# Patient Record
Sex: Male | Born: 1955 | Race: White | Hispanic: No | Marital: Married | Smoking: Never smoker
Health system: Southern US, Community
[De-identification: ages and names within clinical notes are randomized; demographics above are authoritative.]

## PROBLEM LIST (undated history)

## (undated) DIAGNOSIS — E785 Hyperlipidemia, unspecified: Secondary | ICD-10-CM

## (undated) DIAGNOSIS — R030 Elevated blood-pressure reading, without diagnosis of hypertension: Secondary | ICD-10-CM

## (undated) DIAGNOSIS — I2510 Atherosclerotic heart disease of native coronary artery without angina pectoris: Secondary | ICD-10-CM

## (undated) HISTORY — PX: OTHER SURGICAL HISTORY: SHX169

---

## 2008-07-20 ENCOUNTER — Encounter (INDEPENDENT_AMBULATORY_CARE_PROVIDER_SITE_OTHER): Payer: Self-pay | Admitting: *Deleted

## 2009-03-04 ENCOUNTER — Telehealth: Payer: Self-pay | Admitting: Internal Medicine

## 2009-11-01 ENCOUNTER — Encounter (INDEPENDENT_AMBULATORY_CARE_PROVIDER_SITE_OTHER): Payer: Self-pay | Admitting: *Deleted

## 2009-11-28 ENCOUNTER — Encounter (INDEPENDENT_AMBULATORY_CARE_PROVIDER_SITE_OTHER): Payer: Self-pay | Admitting: *Deleted

## 2009-11-30 ENCOUNTER — Ambulatory Visit: Payer: Self-pay | Admitting: Internal Medicine

## 2009-12-12 ENCOUNTER — Ambulatory Visit: Payer: Self-pay | Admitting: Internal Medicine

## 2010-03-02 NOTE — Letter (Signed)
Summary: Pre Visit Letter Revised  [facility]  [address] [place], [place] [address]   Phone: [phone]  Fax: [phone]        11/01/2009 MRN: [id number] Tyler Fletcher [address] [place], [place]  [address]  [place]             Procedure Date:  12/12/2009   Welcome to the Gastroenterology Division at [facility].    You are scheduled to see a nurse for your pre-procedure visit on 11/30/2009 at 10:00AM on the 3rd floor at [facility], [address]  We ask that you try to arrive at our office 15 minutes prior to your appointment time to allow for check-in.  Please take a minute to review the attached form.  If you answer "Yes" to one or more of the questions on the first page, we ask that you call the person listed at your earliest opportunity.  If you answer "No" to all of the questions, please complete the rest of the form and bring it to your appointment.    Your nurse visit will consist of discussing your medical and surgical history, your immediate family medical history, and your medications.   If you are unable to list all of your medications on the form, please bring the medication bottles to your appointment and we will list them.  We will need to be aware of both prescribed and over the counter drugs.  We will need to know exact dosage information as well.    Please be prepared to read and sign documents such as consent forms, a financial agreement, and acknowledgement forms.  If necessary, and with your consent, a friend or relative is welcome to sit-in on the nurse visit with you.  Please bring your insurance card so that we may make a copy of it.  If your insurance requires a referral to see a specialist, please bring your referral form from your primary care physician.  No co-pay is required for this nurse visit.     If you cannot keep your appointment, please call [phone] to cancel or reschedule prior to your appointment date.   This allows [place] the opportunity to schedule an appointment for another patient in need of care.    Thank you for choosing [facility] for your medical needs.  We appreciate the opportunity to care for you.  Please visit [place] at our website  to learn more about our practice.  Sincerely, The Gastroenterology Division

## 2010-03-02 NOTE — Progress Notes (Signed)
Summary: Schedule Colonoscopy  Phone Note Outgoing Call   Call placed by: Hortense Ramal CMA Duncan Dull),  March 04, 2009 4:50 PM Call placed to: Patient Summary of Call: Patient is due for a recall colonoscopy due to her history of diverticulosis and hyperplastic colonic polyps. I have left a message for patient to call back.  Initial call taken by: Hortense Ramal CMA Duncan Dull),  March 04, 2009 4:51 PM  Follow-up for Phone Call        Left message for patient to call back. Hortense Ramal CMA Duncan Dull)  March 10, 2009   We have not recieved a call back from patient. We will send letter. Hortense Ramal CMA Duncan Dull)  March 15, 2009 9:31 AM

## 2010-03-02 NOTE — Letter (Signed)
Summary: [facility] Instructions  [facility]  [address] [place], [place] [address]   Phone: [phone]  Fax: [phone]       Tyler Fletcher    03-22-1955    MRN: [id number]        Procedure Day /Date:  Monday 12/12/2009     Arrival Time: 1:00 pm      Procedure Time: 2:00 pm     Location of Procedure:                    _x _  [facility] (4th Floor)                        PREPARATION FOR COLONOSCOPY WITH MOVIPREP   Starting 5 days prior to your procedure Wednesday 11/9 do not eat nuts, seeds, popcorn, corn, beans, peas,  salads, or any raw vegetables.  Do not take any fiber supplements (e.g. Metamucil, Citrucel, and Benefiber).  THE DAY BEFORE YOUR PROCEDURE         DATE: Sunday 11/13  1.  Drink clear liquids the entire day-NO SOLID FOOD  2.  Do not drink anything colored red or purple.  Avoid juices with pulp.  No orange juice.  3.  Drink at least 64 oz. (8 glasses) of fluid/clear liquids during the day to prevent dehydration and help the prep work efficiently.  CLEAR LIQUIDS INCLUDE: Water Jello Ice Popsicles Tea (sugar ok, no milk/cream) Powdered fruit flavored drinks Coffee (sugar ok, no milk/cream) Gatorade Juice: apple, white grape, white cranberry  Lemonade Clear bullion, consomm, broth Carbonated beverages (any kind) Strained chicken noodle soup Hard Candy                             4.  In the morning, mix first dose of MoviPrep solution:    Empty 1 Pouch A and 1 Pouch B into the disposable container    Add lukewarm drinking water to the top line of the container. Mix to dissolve    Refrigerate (mixed solution should be used within 24 hrs)  5.  Begin drinking the prep at 5:00 p.m. The MoviPrep container is divided by 4 marks.   Every 15 minutes drink the solution down to the next mark (approximately 8 oz) until the full liter is complete.   6.  Follow completed prep with 16 oz of clear liquid of your choice (Nothing  red or purple).  Continue to drink clear liquids until bedtime.  7.  Before going to bed, mix second dose of MoviPrep solution:    Empty 1 Pouch A and 1 Pouch B into the disposable container    Add lukewarm drinking water to the top line of the container. Mix to dissolve    Refrigerate  THE DAY OF YOUR PROCEDURE      DATE: Monday 11/14  Beginning at 9:00 a.m. (5 hours before procedure):         1. Every 15 minutes, drink the solution down to the next mark (approx 8 oz) until the full liter is complete.  2. Follow completed prep with 16 oz. of clear liquid of your choice.    3. You may drink clear liquids until 12:00 pm (2 HOURS BEFORE PROCEDURE).   MEDICATION INSTRUCTIONS  Unless otherwise instructed, you should take regular prescription medications with a small sip of water   as early as possible the morning of  your procedure.           OTHER INSTRUCTIONS  You will need a responsible adult at least 56 years of age to accompany you and drive you home.   This person must remain in the waiting room during your procedure.  Wear loose fitting clothing that is easily removed.  Leave jewelry and other valuables at home.  However, you may wish to bring a book to read or  an iPod/MP3 player to listen to music as you wait for your procedure to start.  Remove all body piercing jewelry and leave at home.  Total time from sign-in until discharge is approximately 2-3 hours.  You should go home directly after your procedure and rest.  You can resume normal activities the  day after your procedure.  The day of your procedure you should not:   Drive   Make legal decisions   Operate machinery   Drink alcohol   Return to work  You will receive specific instructions about eating, activities and medications before you leave.    The above instructions have been reviewed and explained to me by   Ezra Sites RN  November 30, 2009 10:14 AM     I fully understand and can  verbalize these instructions _____________________________ Date _________

## 2010-03-02 NOTE — Miscellaneous (Signed)
Summary: [facility]  Clinical Lists Changes  Medications: Added new medication of MOVIPREP 100 GM  SOLR (PEG-KCL-NACL-NASULF-NA ASC-C) As per prep instructions. - Signed Rx of MOVIPREP 100 GM  SOLR (PEG-KCL-NACL-NASULF-NA ASC-C) As per prep instructions.;  #1 x 0;  Signed;  Entered by: Ezra Sites RN;  Authorized by: Hilarie Fredrickson MD;  Method used: Electronically to [facility] [id number]*, [address], [place], [place], [place]  [address], Ph: [phone], Fax: [phone] Observations: Added new observation of NKA: T (11/30/2009 9:53)    Prescriptions: MOVIPREP 100 GM  SOLR (PEG-KCL-NACL-NASULF-NA ASC-C) As per prep instructions.  #1 x 0   Entered by:   Ezra Sites RN   Authorized by:   Hilarie Fredrickson MD   Signed by:   Ezra Sites RN on 11/30/2009   Method used:   Electronically to        [facility] [phone]* (retail)       [address]       [place], [place]  [address]       Ph: [phone]       Fax: [phone]   RxID:   [id number]

## 2010-03-02 NOTE — Procedures (Signed)
Summary: Colonoscopy  Patient: Tyler Fletcher Note: All result statuses are Final unless otherwise noted.  Tests: (1) Colonoscopy (COL)   COL Colonoscopy           DONE     [facility]     [address]     [place], [place]  [address]           COLONOSCOPY PROCEDURE REPORT           PATIENT:  Tyler Fletcher, Tyler Fletcher  MR#:  [id number]     BIRTHDATE:  1956-01-15, 54 yrs. old  GENDER:  male     ENDOSCOPIST:  Wilhemina Bonito. Eda Keys, MD     REF. BY:  Surveillance Program Recall,     PROCEDURE DATE:  12/12/2009     PROCEDURE:  Surveillance Colonoscopy     ASA CLASS:  Class I     INDICATIONS:  history of polyps, surveillance and high-risk     screening ; index exam 07-2003 w/ HPP x 2     MEDICATIONS:   Fentanyl 100 mcg IV, Versed 10 mg IV           DESCRIPTION OF PROCEDURE:   After the risks benefits and     alternatives of the procedure were thoroughly explained, informed     consent was obtained.  Digital rectal exam was performed and     revealed no abnormalities.   The LB 180AL [id number] endoscope was     introduced through the anus and advanced to the cecum, which was     identified by both the appendix and ileocecal valve, without     limitations.Time to cecum = 1:14 min.  The quality of the prep was     excellent, using MoviPrep.  The instrument was then slowly     withdrawn (time = 10:04 min) as the colon was fully examined.     <<PROCEDUREIMAGES>>           FINDINGS:  Moderate diverticulosis was found in the left colon.     This was otherwise a normal examination of the colon.  No polyps or     cancers were seen.   Retroflexed views in the rectum revealed     internal hemorrhoids.    The scope was then withdrawn from the     patient and the procedure completed.           COMPLICATIONS:  None           ENDOSCOPIC IMPRESSION:     1) Moderate diverticulosis in the left colon     2) Otherwise normal examination     3) No polyps or cancers     4) Internal hemorrhoids        RECOMMENDATIONS:     1) Continue current colorectal screening recommendations for     "routine risk" patients with a repeat colonoscopy in 10 years.     ______________________________     Wilhemina Bonito. Eda Keys, MD           CC:  Lupe Carney, MD; The Patient           n.     eSIGNED:   Wilhemina Bonito. Eda Keys at 12/12/2009 02:39 PM           Lanette Hampshire, [id number]  Note: An exclamation mark (!) indicates a result that was not dispersed into the flowsheet. Document Creation Date: 12/12/2009 2:40 PM _______________________________________________________________________  (1) Order result status: Final Collection or observation  date-time: 12/12/2009 14:31 Requested date-time:  Receipt date-time:  Reported date-time:  Referring Physician:   Ordering Physician: Fransico Setters [id number]) Specimen Source:  Source: Launa Grill Order Number: [id number] Lab site:   Appended Document: Colonoscopy    Clinical Lists Changes  Observations: Added new observation of COLONNXTDUE: 11/2019 (12/12/2009 16:18)

## 2014-11-29 ENCOUNTER — Ambulatory Visit: Payer: Self-pay | Admitting: Surgery

## 2014-11-29 NOTE — H&P (Signed)
History of Present Illness Tyler Fletcher(Jaquelyne Firkus K. Ayodeji Keimig MD; 11/29/2014 10:20 AM) Patient words: cyst and hernia.  The patient is a 59 year old male who presents with an umbilical hernia. Referred by Dr. Lupe Carneyean Mitchell for recurrent umbilical hernia and mass on posterior neck  This is a 59 year old male who is status post right inguinal hernia repair with mesh by myself on 06/05/10. The patient is doing well. Several years ago he began noticing a small lump on the back of his neck. This has become fairly large and uncomfortable. Occasionally does cause headaches. Currently measures about 4 cm across and has begun to protrude. He would like to have this removed to see if that helps with his headaches.  In 1992, the patient underwent laparoscopic cholecystectomy as well as primary repair of a small umbilical hernia. Over the last couple of years he has noticed an enlarging mass just to the right of his incision. He was examined by Dr. Clovis RileyMitchell who felt that this likely represented an umbilical hernia. The patient denies any obstructive symptoms. He presents now to discuss surgical treatment of both of these problems.   Other Problems (Ammie Eversole, LPN; 47/42/595610/31/2016 9:06 AM) Arthritis Back Pain Enlarged Prostate Gastroesophageal Reflux Disease  Past Surgical History (Ammie Eversole, LPN; 38/75/643310/31/2016 9:06 AM) Gallbladder Surgery - Laparoscopic Knee Surgery Left. Laparoscopic Inguinal Hernia Surgery Left. Vasectomy Ventral / Umbilical Hernia Surgery Left.  Diagnostic Studies History (Ammie Eversole, LPN; 29/51/884110/31/2016 9:05 AM) Colonoscopy 1-5 years ago  Allergies (Ammie Eversole, LPN; 66/06/301610/31/2016 9:11 AM) No Known Drug Allergies 11/29/2014  Medication History (Ammie Eversole, LPN; 01/09/323510/31/2016 9:12 AM) Etodolac (400MG  Tablet, Oral) Active. Hydrocodone-Acetaminophen (5-325MG  Tablet, Oral) Active. Multiple Vitamin (Oral) Active. Ranitidine Acid Reducer (75MG  Tablet, Oral)  Active. Claritin (10MG  Capsule, Oral) Active. Medications Reconciled  Social History (Ammie Eversole, LPN; 57/32/202510/31/2016 9:06 AM) Alcohol use Occasional alcohol use. Tobacco use Current some day smoker.  Family History (Deon Pillingmmie Eversole, LPN; 42/70/623710/31/2016 9:06 AM) Arthritis Father, Mother. Hypertension Father.     Review of Systems (Ammie Eversole LPN; 62/83/151710/31/2016 9:06 AM) General Not Present- Appetite Loss, Chills, Fatigue, Fever, Night Sweats, Weight Gain and Weight Loss. Skin Not Present- Change in Wart/Mole, Dryness, Hives, Jaundice, New Lesions, Non-Healing Wounds, Rash and Ulcer. HEENT Present- Seasonal Allergies. Not Present- Earache, Hearing Loss, Hoarseness, Nose Bleed, Oral Ulcers, Ringing in the Ears, Sinus Pain, Sore Throat, Visual Disturbances, Wears glasses/contact lenses and Yellow Eyes. Respiratory Not Present- Bloody sputum, Chronic Cough, Difficulty Breathing, Snoring and Wheezing. Breast Not Present- Breast Mass, Breast Pain, Nipple Discharge and Skin Changes. Cardiovascular Not Present- Chest Pain, Difficulty Breathing Lying Down, Leg Cramps, Palpitations, Rapid Heart Rate, Shortness of Breath and Swelling of Extremities. Gastrointestinal Not Present- Abdominal Pain, Bloating, Bloody Stool, Change in Bowel Habits, Chronic diarrhea, Constipation, Difficulty Swallowing, Excessive gas, Gets full quickly at meals, Hemorrhoids, Indigestion, Nausea, Rectal Pain and Vomiting. Male Genitourinary Present- Frequency, Nocturia and Painful Urination. Not Present- Blood in Urine, Change in Urinary Stream, Impotence, Urgency and Urine Leakage.  Vitals (Ammie Eversole LPN; 61/60/737110/31/2016 9:11 AM) 11/29/2014 9:11 AM Weight: 224.4 lb Height: 75in Body Surface Area: 2.3 m Body Mass Index: 28.05 kg/m  Temp.: 98.23F(Oral)  Pulse: 88 (Regular)  BP: 172/88 (Sitting, Left Arm, Standard)      Physical Exam Molli Hazard(Khaleel Beckom K. Leiam Hopwood MD; 11/29/2014 10:21 AM)  The physical exam  findings are as follows: Note:WDWN in NAD HEENT: EOMI, sclera anicteric Neck: posterior neck - 4 cm protruding subcutaneous mass - no sign of infection or drainage, no thyromegaly Lungs:  CTA bilaterally; normal respiratory effort CV: Regular rate and rhythm; no murmurs Abd: +bowel sounds, soft, non-tender, 3 cm mass to the right of the umbilicus - partially reducible when supine and relaxed Ext: Well-perfused; no edema Skin: Warm, dry; no sign of jaundice    Assessment & Plan Tyler Fletcher. Kemesha Mosey MD; 11/29/2014 9:29 AM)  RECURRENT UMBILICAL HERNIA (K42.9)  Current Plans Schedule for Surgery - Umbilical hernia repair with mesh; excision of subcutaneous lipoma - posterior neck. The surgical procedure has been discussed with the patient. Potential risks, benefits, alternative treatments, and expected outcomes have been explained. All of the patient's questions at this time have been answered. The likelihood of reaching the patient's treatment goal is good. The patient understand the proposed surgical procedure and wishes to proceed. 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 Corliss Skains, MD, [facility]  General/ Trauma Surgery  11/29/2014 10:21 AM

## 2015-02-03 ENCOUNTER — Encounter: Payer: Self-pay | Admitting: Internal Medicine

## 2016-06-09 ENCOUNTER — Inpatient Hospital Stay (HOSPITAL_COMMUNITY)
Admission: EM | Admit: 2016-06-09 | Discharge: 2016-06-13 | DRG: 247 | Disposition: A | Discharge status: Home / Self Care | Payer: BLUE CROSS/BLUE SHIELD | Attending: Interventional Cardiology | Admitting: Interventional Cardiology

## 2016-06-09 ENCOUNTER — Encounter (HOSPITAL_COMMUNITY)
Admission: EM | Disposition: A | Discharge status: Home / Self Care | Payer: Self-pay | Source: Home / Self Care | Attending: Interventional Cardiology

## 2016-06-09 ENCOUNTER — Encounter (HOSPITAL_COMMUNITY): Payer: Self-pay | Admitting: Interventional Cardiology

## 2016-06-09 DIAGNOSIS — I200 Unstable angina: Secondary | ICD-10-CM | POA: Diagnosis not present

## 2016-06-09 DIAGNOSIS — Z955 Presence of coronary angioplasty implant and graft: Secondary | ICD-10-CM

## 2016-06-09 DIAGNOSIS — R739 Hyperglycemia, unspecified: Secondary | ICD-10-CM | POA: Diagnosis present

## 2016-06-09 DIAGNOSIS — Z23 Encounter for immunization: Secondary | ICD-10-CM

## 2016-06-09 DIAGNOSIS — I2102 ST elevation (STEMI) myocardial infarction involving left anterior descending coronary artery: Secondary | ICD-10-CM

## 2016-06-09 DIAGNOSIS — I25110 Atherosclerotic heart disease of native coronary artery with unstable angina pectoris: Principal | ICD-10-CM

## 2016-06-09 DIAGNOSIS — I10 Essential (primary) hypertension: Secondary | ICD-10-CM | POA: Diagnosis present

## 2016-06-09 DIAGNOSIS — E785 Hyperlipidemia, unspecified: Secondary | ICD-10-CM

## 2016-06-09 DIAGNOSIS — R9439 Abnormal result of other cardiovascular function study: Secondary | ICD-10-CM | POA: Diagnosis not present

## 2016-06-09 DIAGNOSIS — R030 Elevated blood-pressure reading, without diagnosis of hypertension: Secondary | ICD-10-CM

## 2016-06-09 DIAGNOSIS — I771 Stricture of artery: Secondary | ICD-10-CM | POA: Diagnosis present

## 2016-06-09 DIAGNOSIS — I213 ST elevation (STEMI) myocardial infarction of unspecified site: Secondary | ICD-10-CM | POA: Diagnosis not present

## 2016-06-09 DIAGNOSIS — I25119 Atherosclerotic heart disease of native coronary artery with unspecified angina pectoris: Secondary | ICD-10-CM | POA: Diagnosis not present

## 2016-06-09 HISTORY — PX: LEFT HEART CATH AND CORONARY ANGIOGRAPHY: CATH118249

## 2016-06-09 HISTORY — DX: Atherosclerotic heart disease of native coronary artery without angina pectoris: I25.10

## 2016-06-09 HISTORY — DX: Elevated blood-pressure reading, without diagnosis of hypertension: R03.0

## 2016-06-09 HISTORY — DX: Hyperlipidemia, unspecified: E78.5

## 2016-06-09 LAB — CBC
HEMATOCRIT: 39.2 % (ref 39.0–52.0)
HEMOGLOBIN: 13.8 g/dL (ref 13.0–17.0)
MCH: 32.5 pg (ref 26.0–34.0)
MCHC: 35.2 g/dL (ref 30.0–36.0)
MCV: 92.5 fL (ref 78.0–100.0)
Platelets: 118 10*3/uL — ABNORMAL LOW (ref 150–400)
RBC: 4.24 MIL/uL (ref 4.22–5.81)
RDW: 12.4 % (ref 11.5–15.5)
WBC: 7.9 10*3/uL (ref 4.0–10.5)

## 2016-06-09 LAB — TROPONIN I
Troponin I: 0.09 ng/mL (ref <0.03)
Troponin I: 0.36 ng/mL (ref <0.03)
Troponin I: 3.43 ng/mL (ref <0.03)

## 2016-06-09 LAB — COMPREHENSIVE METABOLIC PANEL
ALT: 17 U/L (ref 17–63)
AST: 20 U/L (ref 15–41)
Albumin: 4.2 g/dL (ref 3.5–5.0)
Alkaline Phosphatase: 61 U/L (ref 38–126)
Anion gap: 11 (ref 5–15)
BUN: 21 mg/dL — ABNORMAL HIGH (ref 6–20)
CO2: 21 mmol/L — ABNORMAL LOW (ref 22–32)
Calcium: 9.1 mg/dL (ref 8.9–10.3)
Chloride: 107 mmol/L (ref 101–111)
Creatinine, Ser: 0.99 mg/dL (ref 0.61–1.24)
GFR calc Af Amer: >60 mL/min (ref >60)
GFR calc non Af Amer: >60 mL/min (ref >60)
Glucose, Bld: 157 mg/dL — ABNORMAL HIGH (ref 65–99)
Potassium: 4 mmol/L (ref 3.5–5.1)
Sodium: 139 mmol/L (ref 135–145)
Total Bilirubin: 1.1 mg/dL (ref 0.3–1.2)
Total Protein: 6.4 g/dL — ABNORMAL LOW (ref 6.5–8.1)

## 2016-06-09 LAB — POCT I-STAT TROPONIN I: Troponin i, poc: 0.11 ng/mL (ref 0.00–0.08)

## 2016-06-09 LAB — PROTIME-INR
INR: 1.06
PROTHROMBIN TIME: 13.8 s (ref 11.4–15.2)

## 2016-06-09 LAB — POCT I-STAT, CHEM 8
BUN: 22 mg/dL — AB (ref 6–20)
CALCIUM ION: 1.15 mmol/L (ref 1.15–1.40)
CREATININE: 0.8 mg/dL (ref 0.61–1.24)
Chloride: 106 mmol/L (ref 101–111)
GLUCOSE: 174 mg/dL — AB (ref 65–99)
HCT: 40 % (ref 39.0–52.0)
Hemoglobin: 13.6 g/dL (ref 13.0–17.0)
Potassium: 3.8 mmol/L (ref 3.5–5.1)
SODIUM: 140 mmol/L (ref 135–145)
TCO2: 23 mmol/L (ref 0–100)

## 2016-06-09 LAB — POCT ACTIVATED CLOTTING TIME: Activated Clotting Time: 169 seconds

## 2016-06-09 LAB — APTT: APTT: 30 s (ref 24–36)

## 2016-06-09 LAB — LIPID PANEL
Cholesterol: 155 mg/dL (ref 0–200)
HDL: 38 mg/dL — AB (ref >40)
LDL CALC: 82 mg/dL (ref 0–99)
Total CHOL/HDL Ratio: 4.1 RATIO
Triglycerides: 176 mg/dL — ABNORMAL HIGH (ref <150)
VLDL: 35 mg/dL (ref 0–40)

## 2016-06-09 SURGERY — LEFT HEART CATH AND CORONARY ANGIOGRAPHY
Anesthesia: LOCAL

## 2016-06-09 MED ORDER — HEPARIN SODIUM (PORCINE) 5000 UNIT/ML IJ SOLN
INTRAMUSCULAR | Status: AC
  Filled 2016-06-09: qty 1

## 2016-06-09 MED ORDER — SODIUM CHLORIDE 0.9 % IV SOLN: 250.0000 mL | INTRAVENOUS | Status: DC | PRN

## 2016-06-09 MED ORDER — VERAPAMIL HCL 2.5 MG/ML IV SOLN
INTRAVENOUS | Status: AC
  Filled 2016-06-09: qty 2

## 2016-06-09 MED ORDER — IOPAMIDOL (ISOVUE-370) INJECTION 76%
INTRAVENOUS | Status: AC
  Filled 2016-06-09: qty 125

## 2016-06-09 MED ORDER — NITROGLYCERIN 1 MG/10 ML FOR IR/CATH LAB
INTRA_ARTERIAL | Status: AC
  Filled 2016-06-09: qty 10

## 2016-06-09 MED ORDER — LIDOCAINE HCL (PF) 1 % IJ SOLN
INTRAMUSCULAR | Status: DC | PRN
  Administered 2016-06-09: 2 mL

## 2016-06-09 MED ORDER — SODIUM CHLORIDE 0.9 % IV SOLN: INTRAVENOUS | Status: AC

## 2016-06-09 MED ORDER — ASPIRIN 81 MG PO CHEW
81.0000 mg | CHEWABLE_TABLET | Freq: Every day | ORAL | Status: DC
  Administered 2016-06-10 – 2016-06-12 (×3): 81 mg via ORAL
  Filled 2016-06-09 (×3): qty 1

## 2016-06-09 MED ORDER — SODIUM CHLORIDE 0.9% FLUSH
3.0000 mL | Freq: Two times a day (BID) | INTRAVENOUS | Status: DC
Start: 2016-06-09 — End: 2016-06-12
  Administered 2016-06-09 – 2016-06-12 (×7): 3 mL via INTRAVENOUS

## 2016-06-09 MED ORDER — LORATADINE 10 MG PO TABS
10.0000 mg | ORAL_TABLET | Freq: Every day | ORAL | Status: DC
  Administered 2016-06-10 – 2016-06-12 (×3): 10 mg via ORAL
  Filled 2016-06-09 (×4): qty 1

## 2016-06-09 MED ORDER — LIDOCAINE HCL (PF) 1 % IJ SOLN
INTRAMUSCULAR | Status: AC
  Filled 2016-06-09: qty 30

## 2016-06-09 MED ORDER — HEPARIN (PORCINE) IN NACL 100-0.45 UNIT/ML-% IJ SOLN
1650.0000 [IU]/h | INTRAMUSCULAR | Status: DC
  Administered 2016-06-09: 1200 [IU]/h via INTRAVENOUS
  Filled 2016-06-09 (×4): qty 250

## 2016-06-09 MED ORDER — ATORVASTATIN CALCIUM 80 MG PO TABS
80.0000 mg | ORAL_TABLET | Freq: Every day | ORAL | Status: DC
  Administered 2016-06-09 – 2016-06-12 (×4): 80 mg via ORAL
  Filled 2016-06-09 (×4): qty 1

## 2016-06-09 MED ORDER — HEPARIN SODIUM (PORCINE) 5000 UNIT/ML IJ SOLN
4000.0000 [IU] | Freq: Once | INTRAMUSCULAR | Status: AC
  Administered 2016-06-09: 4000 [IU] via INTRAVENOUS

## 2016-06-09 MED ORDER — MIDAZOLAM HCL 2 MG/2ML IJ SOLN
INTRAMUSCULAR | Status: DC | PRN
  Administered 2016-06-09 (×2): 1 mg via INTRAVENOUS

## 2016-06-09 MED ORDER — NITROGLYCERIN IN D5W 200-5 MCG/ML-% IV SOLN: 20.0000 ug/min | INTRAVENOUS | Status: DC

## 2016-06-09 MED ORDER — MIDAZOLAM HCL 2 MG/2ML IJ SOLN
INTRAMUSCULAR | Status: AC
  Filled 2016-06-09: qty 2

## 2016-06-09 MED ORDER — HEPARIN SODIUM (PORCINE) 1000 UNIT/ML IJ SOLN
INTRAMUSCULAR | Status: AC
  Filled 2016-06-09: qty 1

## 2016-06-09 MED ORDER — LABETALOL HCL 5 MG/ML IV SOLN: 10.0000 mg | INTRAVENOUS | Status: AC | PRN

## 2016-06-09 MED ORDER — NITROGLYCERIN IN D5W 200-5 MCG/ML-% IV SOLN
INTRAVENOUS | Status: AC
  Filled 2016-06-09: qty 250

## 2016-06-09 MED ORDER — HYDRALAZINE HCL 20 MG/ML IJ SOLN: 5.0000 mg | INTRAMUSCULAR | Status: AC | PRN

## 2016-06-09 MED ORDER — CLOPIDOGREL BISULFATE 300 MG PO TABS
ORAL_TABLET | ORAL | Status: DC | PRN
  Administered 2016-06-09: 600 mg via ORAL

## 2016-06-09 MED ORDER — FENTANYL CITRATE (PF) 100 MCG/2ML IJ SOLN
INTRAMUSCULAR | Status: AC
  Filled 2016-06-09: qty 2

## 2016-06-09 MED ORDER — ONDANSETRON HCL 4 MG/2ML IJ SOLN: 4.0000 mg | Freq: Four times a day (QID) | INTRAMUSCULAR | Status: DC | PRN

## 2016-06-09 MED ORDER — TAMSULOSIN HCL 0.4 MG PO CAPS
0.4000 mg | ORAL_CAPSULE | Freq: Every day | ORAL | Status: DC
  Administered 2016-06-09 – 2016-06-13 (×5): 0.4 mg via ORAL
  Filled 2016-06-09 (×6): qty 1

## 2016-06-09 MED ORDER — HEPARIN (PORCINE) IN NACL 2-0.9 UNIT/ML-% IJ SOLN
INTRAMUSCULAR | Status: AC
Start: 2016-06-09 — End: 2016-06-09
  Filled 2016-06-09: qty 1500

## 2016-06-09 MED ORDER — METOPROLOL TARTRATE 25 MG PO TABS
25.0000 mg | ORAL_TABLET | Freq: Two times a day (BID) | ORAL | Status: DC
  Administered 2016-06-09 – 2016-06-10 (×4): 25 mg via ORAL
  Filled 2016-06-09 (×4): qty 1

## 2016-06-09 MED ORDER — SODIUM CHLORIDE 0.9% FLUSH: 3.0000 mL | INTRAVENOUS | Status: DC | PRN

## 2016-06-09 MED ORDER — IOPAMIDOL (ISOVUE-370) INJECTION 76%
INTRAVENOUS | Status: DC | PRN
  Administered 2016-06-09: 95 mL via INTRA_ARTERIAL

## 2016-06-09 MED ORDER — FENTANYL CITRATE (PF) 100 MCG/2ML IJ SOLN
INTRAMUSCULAR | Status: DC | PRN
  Administered 2016-06-09: 50 ug via INTRAVENOUS

## 2016-06-09 MED ORDER — CLOPIDOGREL BISULFATE 75 MG PO TABS
75.0000 mg | ORAL_TABLET | Freq: Every day | ORAL | Status: DC
Start: 2016-06-10 — End: 2016-06-12
  Administered 2016-06-10 – 2016-06-12 (×3): 75 mg via ORAL
  Filled 2016-06-09 (×3): qty 1

## 2016-06-09 MED ORDER — ACETAMINOPHEN 325 MG PO TABS
650.0000 mg | ORAL_TABLET | ORAL | Status: DC | PRN
  Administered 2016-06-09 – 2016-06-10 (×4): 650 mg via ORAL
  Filled 2016-06-09 (×6): qty 2

## 2016-06-09 MED ORDER — FAMOTIDINE 20 MG PO TABS
20.0000 mg | ORAL_TABLET | Freq: Two times a day (BID) | ORAL | Status: DC
Start: 2016-06-09 — End: 2016-06-13
  Administered 2016-06-09 – 2016-06-13 (×9): 20 mg via ORAL
  Filled 2016-06-09 (×8): qty 1

## 2016-06-09 MED ORDER — VERAPAMIL HCL 2.5 MG/ML IV SOLN
INTRAVENOUS | Status: DC | PRN
  Administered 2016-06-09: 10 mL via INTRA_ARTERIAL

## 2016-06-09 MED ORDER — PNEUMOCOCCAL VAC POLYVALENT 25 MCG/0.5ML IJ INJ
0.5000 mL | INJECTION | INTRAMUSCULAR | Status: AC
  Administered 2016-06-10: 0.5 mL via INTRAMUSCULAR
  Filled 2016-06-09: qty 0.5

## 2016-06-09 MED ORDER — HEPARIN (PORCINE) IN NACL 2-0.9 UNIT/ML-% IJ SOLN
INTRAMUSCULAR | Status: AC | PRN
  Administered 2016-06-09: 1000 mL

## 2016-06-09 MED ORDER — NITROGLYCERIN IN D5W 200-5 MCG/ML-% IV SOLN
INTRAVENOUS | Status: AC | PRN
  Administered 2016-06-09: 10 ug/min via INTRAVENOUS

## 2016-06-09 MED ORDER — CLOPIDOGREL BISULFATE 300 MG PO TABS
ORAL_TABLET | ORAL | Status: AC
  Filled 2016-06-09: qty 2

## 2016-06-09 MED ORDER — ZOLPIDEM TARTRATE 5 MG PO TABS
5.0000 mg | ORAL_TABLET | Freq: Every evening | ORAL | Status: DC | PRN
Start: 2016-06-09 — End: 2016-06-13
  Administered 2016-06-09 – 2016-06-12 (×4): 5 mg via ORAL
  Filled 2016-06-09 (×4): qty 1

## 2016-06-09 SURGICAL SUPPLY — 13 items
CATH INFINITI 5 FR JL3.5 (CATHETERS) ×1 IMPLANT
CATH INFINITI JR4 5F (CATHETERS) ×1 IMPLANT
COVER PRB 48X5XTLSCP FOLD TPE (BAG) IMPLANT
COVER PROBE 5X48 (BAG) ×2
DEVICE RAD COMP TR BAND LRG (VASCULAR PRODUCTS) ×1 IMPLANT
GLIDESHEATH SLEND A-KIT 6F 22G (SHEATH) ×1 IMPLANT
GUIDEWIRE INQWIRE 1.5J.035X260 (WIRE) IMPLANT
INQWIRE 1.5J .035X260CM (WIRE) ×2
KIT ENCORE 26 ADVANTAGE (KITS) ×1 IMPLANT
KIT HEART LEFT (KITS) ×2 IMPLANT
PACK CARDIAC CATHETERIZATION (CUSTOM PROCEDURE TRAY) ×2 IMPLANT
TRANSDUCER W/STOPCOCK (MISCELLANEOUS) ×2 IMPLANT
TUBING CIL FLEX 10 FLL-RA (TUBING) ×2 IMPLANT

## 2016-06-09 NOTE — Progress Notes (Signed)
Patient takes Zantac at home for acid reflux and is also requesting Ambien tonight for sleep.  Per patient has had Ambien before.  RN text paged Cardiology with this information.

## 2016-06-09 NOTE — Progress Notes (Signed)
Chaplain responded to page.  Patient gone to Cath lab.  Chaplain gathered spouse from ED waiting area and accompanied her to the waiting room and was with her when the doctor came out to give her report.  Chaplain also went with spouse to patient's room on 3W.    Spouse talked about sudden onset of changes her husband experienced for the first time in his health this morning.  Spouse also talked about some additional stress from the patient's father being placed in nursing home facility.    Thank you to everyone who took care of this patient.    06/09/16 1120  Clinical Encounter Type  Visited With Patient and family together  Visit Type Initial;Spiritual support;Social support;Trauma  Spiritual Encounters  Spiritual Needs Prayer;Emotional  Stress Factors  Patient Stress Factors Health changes  Family Stress Factors Health changes;Major life changes

## 2016-06-09 NOTE — ED Notes (Signed)
Cardiology at bedside.

## 2016-06-09 NOTE — ED Provider Notes (Signed)
[facility]-EMERGENCY DEPT Provider Note   CSN: [id number] Arrival date & time: 06/09/16  4098     History   Chief Complaint Chief Complaint  Patient presents with  . Code STEMI    HPI Tyler Fletcher is a 61 y.o. male.  The history is provided by the patient.  Chest Pain   This is a new problem. The current episode started less than 1 hour ago. The problem occurs rarely. The problem has been resolved. The pain is associated with raising an arm. The pain is present in the substernal region. The pain is severe. The quality of the pain is described as heavy and dull. The pain does not radiate. Duration of episode(s) is 15 minutes. Associated symptoms include diaphoresis, nausea and shortness of breath. Pertinent negatives include no abdominal pain and no syncope. He has tried nitroglycerin for the symptoms. The treatment provided significant relief. There are no known risk factors.  His family medical history is significant for CAD.    No past medical history on file.  There are no active problems to display for this patient.   No past surgical history on file.     Home Medications    Prior to Admission medications   Not on File    Family History No family history on file.  Social History Social History  Substance Use Topics  . Smoking status: Not on file  . Smokeless tobacco: Not on file  . Alcohol use Not on file     Allergies   Patient has no allergy information on record.   Review of Systems Review of Systems  Constitutional: Positive for diaphoresis.  Respiratory: Positive for shortness of breath.   Cardiovascular: Positive for chest pain. Negative for syncope.  Gastrointestinal: Positive for nausea. Negative for abdominal pain.  All other systems reviewed and are negative.    Physical Exam Updated Vital Signs There were no vitals taken for this visit.  Physical Exam Physical Exam  Constitutional: He appears well-developed and well-nourished.  HENT:   Head: Normocephalic.  Eyes: Conjunctivae are normal.  Cardiovascular: Normal rate and intact distal pulses.  regular rhythm Pulmonary/Chest: Effort normal. No respiratory distress.  Abdominal: He exhibits no distension.  Musculoskeletal: Normal range of motion. He exhibits no deformity.  Neurological: He is alert.  Skin: Skin is warm and dry.  Psychiatric: He has a normal mood and affect. His behavior is normal.  Nursing note and vitals reviewed.   ED Treatments / Results  Labs (all labs ordered are listed, but only abnormal results are displayed) Labs Reviewed - No data to display  EKG  EKG Interpretation None       Radiology No results found.  Procedures Procedures (including critical care time) CRITICAL CARE Performed by: Lavera Guise   Total critical care time: 31 minutes  Critical care time was exclusive of separately billable procedures and treating other patients.  Critical care was necessary to treat or prevent imminent or life-threatening deterioration.  Critical care was time spent personally by me on the following activities: development of treatment plan with patient and/or surrogate as well as nursing, discussions with consultants, evaluation of patient's response to treatment, examination of patient, obtaining history from patient or surrogate, ordering and performing treatments and interventions, ordering and review of laboratory studies, ordering and review of radiographic studies, pulse oximetry and re-evaluation of patient's condition.  Medications Ordered in ED Medications  heparin injection 4,000 Units (not administered)  heparin 5000 UNIT/ML injection (not administered)  Initial Impression / Assessment and Plan / ED Course  I have reviewed the triage vital signs and the nursing notes.  Pertinent labs & imaging results that were available during my care of the patient were reviewed by me and considered in my medical decision making (see  chart for details).    CODE stemi activated by EMS prior to arrival. EMS EKGs reviewed with evidence of anterior STEMI. No chest pain on arrival to ED. Dr. Katrinka BlazingSmith from cardiology present on patient arrival. Requested to take directly to catheterrization. Repeat EKG in ED not obtained as did not want to delay transfer to cath lab.   Final Clinical Impressions(s) / ED Diagnoses   Final diagnoses:  Acute ST elevation myocardial infarction (STEMI) involving left anterior descending (LAD) coronary artery [facility]    New Prescriptions New Prescriptions   No medications on file     Lavera GuiseLiu, Janiel Derhammer Duo, MD 06/09/16 [phone]

## 2016-06-09 NOTE — Progress Notes (Signed)
ANTICOAGULATION CONSULT NOTE - Initial Consult  Pharmacy Consult for heparin (to begin 8 hrs post-sheath removal) Indication: chest pain/ACS, post-STEMI  No Known Allergies  Patient Measurements: Height: 6\' 3"  (190.5 cm) Weight: 214 lb (97.1 kg) IBW/kg (Calculated) : 84.5 Heparin Dosing Weight: 97 kg   Vital Signs: BP: 145/97 (05/12 1023) Pulse Rate: 68 (05/12 1023)  Labs:  Recent Labs  06/09/16 0930 06/09/16 0959 06/09/16 1012  HGB  --  13.6 13.8  HCT  --  40.0 39.2  PLT  --   --  118*  APTT 30  --   --   LABPROT 13.8  --   --   INR 1.06  --   --   CREATININE 0.99 0.80  --   TROPONINI 0.09*  --   --     Estimated Creatinine Clearance: 114.4 mL/min (by C-G formula based on SCr of 0.8 mg/dL).   Medical History: Past Medical History:  Diagnosis Date  . Blood pressure elevated without history of HTN 06/09/2016   Assessment: 61 yo male admitted with chest pain. Now s/p cath for STEMI and pharmacy to begin heparin 8 hours after the sheath was removed. No oral AC PTA per records. CBC stable, no s/s bleeding noted. Per cath records, sheath removed at 1030 AM.   Goal of Therapy:  Heparin level 0.3-0.7 units/ml Monitor platelets by anticoagulation protocol: Yes   Plan:  Start heparin gtt at 1200 units/hr at 1830 (8 hrs post-sheath removal) Heparin level 6 hours after heparin begins Daily heparin level and CBC  Monitor for s/s bleeding   York CeriseKatherine Cook, PharmD Pharmacy Resident  Pager [phone] 06/09/16 11:40 AM

## 2016-06-09 NOTE — Progress Notes (Addendum)
PHASE I CARDIAC REHAB EDUCATION  At pt request, ambulation deferred at this time.  Pt education started including MI basics, MI booklet given.  Pt questions about event answered. Pt offered emotional support and reassurance. Pt would like to ambulate later in the day once his wife accompanies him at the hospital.  Pt RN made aware.

## 2016-06-09 NOTE — ED Notes (Signed)
Dr Katrinka BlazingSmith at bedside upon pt arrival

## 2016-06-09 NOTE — Progress Notes (Signed)
TR band removed with no immediate complications.  Right Radial level 0.  Pressure dsg applied.  Pt and wife at bedside advised to leave in place for 24hrs.  Pt very compliant/careful with arm limitation instructions.  Vitals stable. Will continue to monitor.

## 2016-06-09 NOTE — H&P (Addendum)
Tyler Fletcher is a 61 y.o. male  Admit Date: 06/09/2016 Referring Physician: Code STEMI activation Primary Cardiologist: Lupe Carneyean Mitchell, MD Chief complaint / reason for admission: Spontaneously aborted anterior ST elevation MI  HPI: The patient was evaluated on the emergency vehicle stretcher in the emergency room. He was noted to be relatively stable. EKGs were assessed. There was evidence of transient anterior wall ischemia. This was considered to be an emergency situation.  61 year old healthy gentleman with history of blood pressure elevation but no chronic illnesses requiring medication therapy. He worked hard in his yard at about his property yesterday. Had some shoulder discomfort last evening that was new and unusual but fleeting. This morning after reaching up into his close it he developed left pectoral discomfort, nausea, diaphoresis, and a sense of apprehension. Because of his symptoms emergency medical services was called. An EKG was performed and demonstrated ST elevation in V3 through 6 without reciprocal change. Upon arrival and with the administration of nitroglycerin, chest discomfort gradually abated. Total duration of discomfort was less than 20 minutes. He is pain-free at the time of evaluation in the emergency room. Blood pressure is extremely elevated.  There is no prior history of diabetes, tobacco use, hyperlipidemia, coronary disease, or CVA.  PMH:    Past Medical History:  Diagnosis Date  . Blood pressure elevated without history of HTN 06/09/2016    PSH:    Past Surgical History:  Procedure Laterality Date  . None     ALLERGIES:   Patient has no known allergies. Prior to Admit Meds:   No prescriptions prior to admission.   Family HX:   No family history on file. Social HX:    Social History   Social History  . Marital status: Married    Spouse name: N/A  . Number of children: 2  . Years of education: N/A   Occupational History  . Unemployed     Social History Main Topics  . Smoking status: Never Smoker  . Smokeless tobacco: Never Used  . Alcohol use No  . Drug use: Unknown  . Sexual activity: Not on file   Other Topics Concern  . Not on file   Social History Narrative  . No narrative on file     ROS Occasional aches and pains. No exercise limitations. No prior surgeries. No history of stroke or other vascular problems. All other systems are negative.  Physical Exam: Blood pressure (!) 145/97, pulse 68, resp. rate 12, height 6\' 3"  (1.905 m), weight 214 lb (97.1 kg), SpO2 100 %.    Moderately obese middle-aged gentleman who at the time of evaluation was very anxious but refusing the presence of any significant chest discomfort. Skin is clear with mild pallor. HEENT exam reveals pupils are equal and reactive to light. Neck exam reveals no JVD or carotid bruits. Chest is clear to auscultation and percussion. Cardiac exam shows no murmur or gallop. No rub is heard. Abdomen is soft. Bowel sounds are normal. No bruits are heard. Extremities reveal no edema. Radial pulses femoral pulses and posterior tibial pulses are 2+. Popliteal pulses are 3+. Neurological exam reveals no motor or sensory deficits. The patient is alert and oriented.  Labs: Lab Results  Component Value Date   WBC 7.9 06/09/2016   HGB 13.8 06/09/2016   HCT 39.2 06/09/2016   MCV 92.5 06/09/2016   PLT PENDING 06/09/2016     Recent Labs Lab 06/09/16 0959  NA 140  K 3.8  CL  106  BUN 22*  CREATININE 0.80  GLUCOSE 174*   No results found for: CKTOTAL, CKMB, CKMBINDEX, TROPONINI I Radiology:  No results found.  EKG:  Initial ECG by EMS revealed ST elevation V3 through V6 most prominent in V4 and V5. No significant reciprocal changes seen. Repeat EKG while in transport reveal resolution of ST elevation.  ASSESSMENT:  1. Aborted anterior ST elevation myocardial infarction, with chest pain duration less than 20 minutes. 2. Elevated blood  pressure 3. Hyperglycemia, rule out diabetes mellitus.  Plan:  Under the circumstances the patient was counseled to undergo left heart catheterization, coronary angiography, and possible percutaneous coronary intervention with stent implantation. The procedural risks and benefits were discussed in detail. The risks discussed included death, stroke, myocardial infarction, life-threatening bleeding, limb ischemia, kidney injury, allergy, and possible emergency cardiac surgery. The risk of these significant complications were estimated to occur less than 1% of the time. After discussion, the patient has agreed to proceed.  The patient was taken immediately from the emergency department to the catheterization laboratory.  Critical care time 32 minutes.  Lyn Records III 06/09/2016 10:50 AM

## 2016-06-09 NOTE — Progress Notes (Signed)
CRITICAL VALUE ALERT  Critical value received:  Trop 0.09, 0.11  Date of notification:  06/09/2016  Time of notification:  1115  Critical value read back:Yes.    Nurse who received alert:  Jeris PentaElena M, RN  MD notified (1st page):  Lyman BishopLawrence, NP  Time of first page:  1200  MD notified (2nd page):  Time of second page:  Responding MD:  Lyman BishopLawrence, NP  Time MD responded:

## 2016-06-09 NOTE — ED Notes (Signed)
Pt placed on translucent Zoll pads

## 2016-06-09 NOTE — ED Triage Notes (Signed)
Pt was reaching up and began experiencing L sided chest pain around 0820.  Pain did not resolve, so called 911.  EKG showed stemi.  Given 4 nitro and 324 asa en-route which pt now is pain free.  Vs stable

## 2016-06-10 LAB — CBC
HCT: 43.5 % (ref 39.0–52.0)
HEMOGLOBIN: 15.2 g/dL (ref 13.0–17.0)
MCH: 32.8 pg (ref 26.0–34.0)
MCHC: 34.9 g/dL (ref 30.0–36.0)
MCV: 94 fL (ref 78.0–100.0)
Platelets: 126 10*3/uL — ABNORMAL LOW (ref 150–400)
RBC: 4.63 MIL/uL (ref 4.22–5.81)
RDW: 13 % (ref 11.5–15.5)
WBC: 9.4 10*3/uL (ref 4.0–10.5)

## 2016-06-10 LAB — BASIC METABOLIC PANEL
ANION GAP: 8 (ref 5–15)
BUN: 10 mg/dL (ref 6–20)
CHLORIDE: 106 mmol/L (ref 101–111)
CO2: 25 mmol/L (ref 22–32)
Calcium: 9.2 mg/dL (ref 8.9–10.3)
Creatinine, Ser: 1.03 mg/dL (ref 0.61–1.24)
GFR calc non Af Amer: >60 mL/min (ref >60)
Glucose, Bld: 163 mg/dL — ABNORMAL HIGH (ref 65–99)
POTASSIUM: 4.4 mmol/L (ref 3.5–5.1)
Sodium: 139 mmol/L (ref 135–145)

## 2016-06-10 LAB — HEMOGLOBIN A1C
HEMOGLOBIN A1C: 5.4 % (ref 4.8–5.6)
MEAN PLASMA GLUCOSE: 108 mg/dL

## 2016-06-10 LAB — HEPARIN LEVEL (UNFRACTIONATED)
HEPARIN UNFRACTIONATED: 0.21 [IU]/mL — AB (ref 0.30–0.70)
Heparin Unfractionated: 0.25 IU/mL — ABNORMAL LOW (ref 0.30–0.70)
Heparin Unfractionated: 0.29 IU/mL — ABNORMAL LOW (ref 0.30–0.70)

## 2016-06-10 LAB — TROPONIN I: Troponin I: 1.66 ng/mL (ref <0.03)

## 2016-06-10 MED ORDER — NON FORMULARY: 180.0000 mg | Freq: Every morning | Status: DC

## 2016-06-10 MED ORDER — FEXOFENADINE HCL 180 MG PO TABS
180.0000 mg | ORAL_TABLET | Freq: Every day | ORAL | Status: DC
  Administered 2016-06-10 – 2016-06-13 (×4): 180 mg via ORAL
  Filled 2016-06-10 (×4): qty 1

## 2016-06-10 MED ORDER — LOPERAMIDE HCL 2 MG PO CAPS
2.0000 mg | ORAL_CAPSULE | ORAL | Status: DC | PRN
  Administered 2016-06-10: 2 mg via ORAL
  Filled 2016-06-10: qty 1

## 2016-06-10 NOTE — Plan of Care (Signed)
Problem: Cardiovascular: Goal: Vascular access site(s) Level 0-1 will be maintained Outcome: Progressing Vascular access site level 0 thus far throughout night shift.

## 2016-06-10 NOTE — Progress Notes (Signed)
Subjective:  Very nice 61 year old man admitted yesterday with an aborted anterior infarction.  He has had minimal troponin elevations.  He was found to have a 75% mid LAD with diffuse narrowing in the LAD beyond the mid lesion and decided against intervention due to the appearance.  There is also a tortuous circumflex with a 50-60% proximal to mid narrowing.  He is currently pain-free.  Dr. Katrinka BlazingSmith recommended nitroglycerin for 24 hours and then IV heparin for 24-48 hours.  He considered a treadmill test prior to discharge.  Objective:  Vital Signs in the last 24 hours: BP (!) 152/84   Pulse 74   Temp 99.1 F (37.3 C)   Resp 14   Ht 6\' 3"  (1.905 m)   Wt 97.5 kg (215 lb)   SpO2 98%   BMI 26.87 kg/m   Physical Exam: Pleasant male in no acute distress Lungs:  Clear Cardiac:  Regular rhythm, normal S1 and S2, no S3 Abdomen:  Soft, nontender, no masses Extremities:  Radial catheterization site well healed  Intake/Output from previous day: 05/12 0701 - 05/13 0700 In: 1463.9 [P.O.:600; I.V.:863.9] Out: 150 [Urine:150]  Weight Filed Weights   06/09/16 0933 06/10/16 0509  Weight: 97.1 kg (214 lb) 97.5 kg (215 lb)    Lab Results: Basic Metabolic Panel:  Recent Labs  16/10/9603/12/18 0930 06/09/16 0959 06/10/16 0628  NA 139 140 139  K 4.0 3.8 4.4  CL 107 106 106  CO2 21*  --  25  GLUCOSE 157* 174* 163*  BUN 21* 22* 10  CREATININE 0.99 0.80 1.03   CBC:  Recent Labs  06/09/16 1012 06/10/16 0628  WBC 7.9 9.4  HGB 13.8 15.2  HCT 39.2 43.5  MCV 92.5 94.0  PLT 118* 126*   Cardiac Enzymes: Troponin (Point of Care Test)  Recent Labs  06/09/16 0942  TROPIPOC 0.11*   Cardiac Panel (last 3 results)  Recent Labs  06/09/16 1109 06/09/16 1851 06/10/16 0112  TROPONINI 0.36* 3.43* 1.66*    Telemetry: Sinus rhythm  Assessment/Plan:  1.  Aborted anterior myocardial infarction with trivial elevation of troponin-EKG today shows mild T wave inversions in the anteroseptal  leads. 2.  Elevation of glucose possible diabetes 3.  Elevation of blood pressure  Recommendations:  Stop nitroglycerin today and continue heparin for another 24 hours and then discontinue.  Obtain hemoglobin A1c as well as further metabolic panel.     Tyler Fletcher, Jr.  MD [facility] Cardiology  06/10/2016, 11:31 AM

## 2016-06-10 NOTE — Progress Notes (Signed)
ANTICOAGULATION CONSULT NOTE - Follow-up Consult  Pharmacy Consult for heparin Indication: chest pain/ACS, post-STEMI  No Known Allergies  Patient Measurements: Height: 6\' 3"  (190.5 cm) Weight: 215 lb (97.5 kg) IBW/kg (Calculated) : 84.5 Heparin Dosing Weight: 97 kg   Vital Signs: Temp: 98.6 F (37 C) (05/13 1500) Temp Source: Oral (05/13 1500) BP: 151/83 (05/13 1500) Pulse Rate: 76 (05/13 1500)  Labs:  Recent Labs  06/09/16 0930  06/09/16 0959 06/09/16 1012 06/09/16 1109 06/09/16 1851 06/10/16 0112 06/10/16 0628 06/10/16 0925 06/10/16 1755  HGB  --   < > 13.6 13.8  --   --   --  15.2  --   --   HCT  --   --  40.0 39.2  --   --   --  43.5  --   --   PLT  --   --   --  118*  --   --   --  126*  --   --   APTT 30  --   --   --   --   --   --   --   --   --   LABPROT 13.8  --   --   --   --   --   --   --   --   --   INR 1.06  --   --   --   --   --   --   --   --   --   HEPARINUNFRC  --   --   --   --   --   --  0.25*  --  0.21* 0.29*  CREATININE 0.99  --  0.80  --   --   --   --  1.03  --   --   TROPONINI 0.09*  --   --   --  0.36* 3.43* 1.66*  --   --   --   < > = values in this interval not displayed.  Estimated Creatinine Clearance: 88.9 mL/min (by C-G formula based on SCr of 1.03 mg/dL).  Assessment: 61 yo male admitted with chest pain. Now s/p cath for STEMI and pharmacy to dose heparin. No oral AC PTA per records.   Heparin level subtherapeutic at 0.29units/mL this evening despite heparin rate increase earlier today. No bleeding noted. Plan for heparin for 24-48 hrs per MD.   Goal of Therapy:  Heparin level 0.3-0.7 units/ml Monitor platelets by anticoagulation protocol: Yes   Plan:  Increase heparin to 1650 units/hr Daily heparin level and CBC  Monitor for s/s bleeding   Tykeria Wawrzyniak D. Larrissa Stivers, PharmD, BCPS Clinical Pharmacist Pager: [phone] 06/10/2016 6:40 PM

## 2016-06-10 NOTE — Progress Notes (Signed)
Per patient has had multiple loose stools this shift.  Per patient stool has some solid pieces in it, not complete liquid.  RN ordered Imodium per MD order and administered one capsule.

## 2016-06-10 NOTE — Progress Notes (Signed)
ANTICOAGULATION CONSULT NOTE - Follow Up Consult  Pharmacy Consult for Heparin  Indication: chest pain/ACS, s/p cath  No Known Allergies  Patient Measurements: Height: 6\' 3"  (190.5 cm) Weight: 214 lb (97.1 kg) IBW/kg (Calculated) : 84.5  Vital Signs: Temp: 98.9 F (37.2 C) (05/12 2031) BP: 137/79 (05/12 2031) Pulse Rate: 80 (05/12 2031)  Labs:  Recent Labs  06/09/16 0930 06/09/16 0959 06/09/16 1012 06/09/16 1109 06/09/16 1851 06/10/16 0112  HGB  --  13.6 13.8  --   --   --   HCT  --  40.0 39.2  --   --   --   PLT  --   --  118*  --   --   --   APTT 30  --   --   --   --   --   LABPROT 13.8  --   --   --   --   --   INR 1.06  --   --   --   --   --   HEPARINUNFRC  --   --   --   --   --  0.25*  CREATININE 0.99 0.80  --   --   --   --   TROPONINI 0.09*  --   --  0.36* 3.43* 1.66*    Estimated Creatinine Clearance: 114.4 mL/min (by C-G formula based on SCr of 0.8 mg/dL).   Assessment: Heparin level sub-therapeutic s/p cath, no issues per RN, plan for heparin for 24-48 hrs per MD.   Goal of Therapy:  Heparin level 0.3-0.7 units/ml Monitor platelets by anticoagulation protocol: Yes   Plan:  Inc heparin to 1300 units/hr 1000 HL  Tyler Fletcher, Tyler Fletcher 06/10/2016,2:09 AM

## 2016-06-10 NOTE — Progress Notes (Signed)
ANTICOAGULATION CONSULT NOTE - Follow-up Consult  Pharmacy Consult for heparin (to begin 8 hrs post-sheath removal) Indication: chest pain/ACS, post-STEMI  No Known Allergies  Patient Measurements: Height: 6\' 3"  (190.5 cm) Weight: 215 lb (97.5 kg) IBW/kg (Calculated) : 84.5 Heparin Dosing Weight: 97 kg   Vital Signs: Temp: 99.1 F (37.3 C) (05/13 0509) BP: 152/84 (05/13 0509) Pulse Rate: 74 (05/13 0509)  Labs:  Recent Labs  06/09/16 0930  06/09/16 0959 06/09/16 1012 06/09/16 1109 06/09/16 1851 06/10/16 0112 06/10/16 0628 06/10/16 0925  HGB  --   < > 13.6 13.8  --   --   --  15.2  --   HCT  --   --  40.0 39.2  --   --   --  43.5  --   PLT  --   --   --  118*  --   --   --  126*  --   APTT 30  --   --   --   --   --   --   --   --   LABPROT 13.8  --   --   --   --   --   --   --   --   INR 1.06  --   --   --   --   --   --   --   --   HEPARINUNFRC  --   --   --   --   --   --  0.25*  --  0.21*  CREATININE 0.99  --  0.80  --   --   --   --  1.03  --   TROPONINI 0.09*  --   --   --  0.36* 3.43* 1.66*  --   --   < > = values in this interval not displayed.  Estimated Creatinine Clearance: 88.9 mL/min (by C-G formula based on SCr of 1.03 mg/dL).   Medical History: Past Medical History:  Diagnosis Date  . Blood pressure elevated without history of HTN 06/09/2016   Assessment: 61 yo male admitted with chest pain. Now s/p cath for STEMI and pharmacy to dose heparin. No oral AC PTA per records. Heparin level subtherapeutic at 0.21 despite heparin rate increase. RN reports no issues with line. CBC stable, no s/s bleeding noted. Plan for heparin for 24-48 hrs per MD.   Goal of Therapy:  Heparin level 0.3-0.7 units/ml Monitor platelets by anticoagulation protocol: Yes   Plan:  Increase heparin to 1500 units/hr Heparin level in 6 hours  Daily heparin level and CBC  Monitor for s/s bleeding   York CeriseKatherine Cook, PharmD Pharmacy Resident  Pager [phone] 06/10/16  10:05 AM

## 2016-06-11 ENCOUNTER — Encounter (HOSPITAL_COMMUNITY): Payer: Self-pay | Admitting: Interventional Cardiology

## 2016-06-11 DIAGNOSIS — I25119 Atherosclerotic heart disease of native coronary artery with unspecified angina pectoris: Secondary | ICD-10-CM

## 2016-06-11 DIAGNOSIS — E785 Hyperlipidemia, unspecified: Secondary | ICD-10-CM

## 2016-06-11 LAB — CBC
HCT: 46.7 % (ref 39.0–52.0)
Hemoglobin: 16.4 g/dL (ref 13.0–17.0)
MCH: 32.9 pg (ref 26.0–34.0)
MCHC: 35.1 g/dL (ref 30.0–36.0)
MCV: 93.8 fL (ref 78.0–100.0)
PLATELETS: 129 10*3/uL — AB (ref 150–400)
RBC: 4.98 MIL/uL (ref 4.22–5.81)
RDW: 13.1 % (ref 11.5–15.5)
WBC: 11.4 10*3/uL — AB (ref 4.0–10.5)

## 2016-06-11 LAB — HEMOGLOBIN A1C
Hgb A1c MFr Bld: 5.3 % (ref 4.8–5.6)
MEAN PLASMA GLUCOSE: 105 mg/dL

## 2016-06-11 LAB — HEPARIN LEVEL (UNFRACTIONATED)
HEPARIN UNFRACTIONATED: 0.5 [IU]/mL (ref 0.30–0.70)
Heparin Unfractionated: 0.44 IU/mL (ref 0.30–0.70)

## 2016-06-11 MED ORDER — METOPROLOL TARTRATE 50 MG PO TABS
50.0000 mg | ORAL_TABLET | Freq: Two times a day (BID) | ORAL | Status: DC
  Administered 2016-06-11 – 2016-06-12 (×3): 50 mg via ORAL
  Filled 2016-06-11 (×3): qty 1

## 2016-06-11 MED ORDER — METOPROLOL TARTRATE 50 MG PO TABS: 50.0000 mg | ORAL_TABLET | Freq: Two times a day (BID) | ORAL | Status: DC

## 2016-06-11 NOTE — Progress Notes (Signed)
ANTICOAGULATION CONSULT NOTE - Follow Up Consult  Pharmacy Consult for Heparin  Indication: chest pain/ACS, post-STEMI  No Known Allergies  Patient Measurements: Height: 6\' 3"  (190.5 cm) Weight: 211 lb 3.2 oz (95.8 kg) IBW/kg (Calculated) : 84.5   Vital Signs: Temp: 99.1 F (37.3 C) (05/14 0511) BP: 162/105 (05/14 1115) Pulse Rate: 95 (05/14 1115)  Labs:  Recent Labs  06/09/16 0930  06/09/16 0959 06/09/16 1012 06/09/16 1109 06/09/16 1851 06/10/16 0112 06/10/16 0628  06/10/16 1755 06/11/16 0528 06/11/16 1129  HGB  --   < > 13.6 13.8  --   --   --  15.2  --   --  16.4  --   HCT  --   < > 40.0 39.2  --   --   --  43.5  --   --  46.7  --   PLT  --   --   --  118*  --   --   --  126*  --   --  129*  --   APTT 30  --   --   --   --   --   --   --   --   --   --   --   LABPROT 13.8  --   --   --   --   --   --   --   --   --   --   --   INR 1.06  --   --   --   --   --   --   --   --   --   --   --   HEPARINUNFRC  --   --   --   --   --   --  0.25*  --   < > 0.29* 0.50 0.44  CREATININE 0.99  --  0.80  --   --   --   --  1.03  --   --   --   --   TROPONINI 0.09*  --   --   --  0.36* 3.43* 1.66*  --   --   --   --   --   < > = values in this interval not displayed.  Estimated Creatinine Clearance: 88.9 mL/min (by C-G formula based on SCr of 1.03 mg/dL).   Assessment: 61 yo male admitted with chest pain. Now s/p cath for STEMI and pharmacy managing heparin drip. Plan was to continue for 24-48 hrs post cath.  Heparin level is therapeutic at 0.44 on 1650 units/hr. No bleeding noted, Hgb is stable, platelets are low at 129.   Goal of Therapy:  Heparin level 0.3-0.7 units/ml Monitor platelets by anticoagulation protocol: Yes   Plan:  - Continue heparin drip at 1650 units/hr - MD to decide on when to stop - Daily heparin level and CBC - Monitor for s/sx of bleeding   Loura BackJennifer Scotia, PharmD, BCPS Clinical Pharmacist Phone for today [phone] Main pharmacy -  [phone] 06/11/2016 1:12 PM

## 2016-06-11 NOTE — Progress Notes (Signed)
ANTICOAGULATION CONSULT NOTE - Follow Up Consult  Pharmacy Consult for Heparin  Indication: chest pain/ACS, s/p cath  No Known Allergies  Patient Measurements: Height: 6\' 3"  (190.5 cm) Weight: 211 lb 3.2 oz (95.8 kg) IBW/kg (Calculated) : 84.5   Vital Signs: Temp: 99.1 F (37.3 C) (05/14 0511) BP: 162/93 (05/14 0511) Pulse Rate: 78 (05/14 0511)  Labs:  Recent Labs  06/09/16 0930  06/09/16 0959 06/09/16 1012 06/09/16 1109 06/09/16 1851  06/10/16 0112 06/10/16 0628 06/10/16 0925 06/10/16 1755 06/11/16 0528  HGB  --   < > 13.6 13.8  --   --   --   --  15.2  --   --  16.4  HCT  --   < > 40.0 39.2  --   --   --   --  43.5  --   --  46.7  PLT  --   --   --  118*  --   --   --   --  126*  --   --  129*  APTT 30  --   --   --   --   --   --   --   --   --   --   --   LABPROT 13.8  --   --   --   --   --   --   --   --   --   --   --   INR 1.06  --   --   --   --   --   --   --   --   --   --   --   HEPARINUNFRC  --   --   --   --   --   --   < > 0.25*  --  0.21* 0.29* 0.50  CREATININE 0.99  --  0.80  --   --   --   --   --  1.03  --   --   --   TROPONINI 0.09*  --   --   --  0.36* 3.43*  --  1.66*  --   --   --   --   < > = values in this interval not displayed.  Estimated Creatinine Clearance: 88.9 mL/min (by C-G formula based on SCr of 1.03 mg/dL).   Assessment: Heparin s/p cath for 24-48 hours, likely to DC later today, HL therapeutic after rate increase  Goal of Therapy:  Heparin level 0.3-0.7 units/ml Monitor platelets by anticoagulation protocol: Yes   Plan:  -Cont heparin 1650 units/hr -1200 HL -Possible DC today  Abran DukeLedford, Carren Blakley 06/11/2016,7:29 AM

## 2016-06-11 NOTE — Progress Notes (Signed)
CARDIAC REHAB PHASE I   PRE:  Rate/Rhythm: 82 SR    BP: sitting 168/97    SaO2:   MODE:  Ambulation: 650 ft   POST:  Rate/Rhythm: 95 SR    BP: sitting 162/105     SaO2:   Pt eager to walk. Tolerated well, no c/o. BP elevated. RN just gave meds. Began discussing diet and CRPII. Will discuss more as lunch came. Can walk independently today. [phone]   Harriet MassonRandi Kristan Azim Gillingham CES, ACSM 06/11/2016 12:07 PM

## 2016-06-11 NOTE — Progress Notes (Signed)
Progress Note  Patient Name: Tyler Fletcher Date of Encounter: 06/11/2016  Primary Cardiologist: Clovis RileyMitchell, MD  Subjective   Patient seen and examined this morning.  No further CP, SOB, orthopnea, LE swelling.  States he is NPO this AM for stress test.  Reports some oozing from left AC IV site.  No melena.  Inpatient Medications    Scheduled Meds: . aspirin  81 mg Oral Daily  . atorvastatin  80 mg Oral q1800  . clopidogrel  75 mg Oral Q breakfast  . famotidine  20 mg Oral BID  . fexofenadine  180 mg Oral Daily  . loratadine  10 mg Oral Daily  . metoprolol tartrate  25 mg Oral BID  . sodium chloride flush  3 mL Intravenous Q12H  . tamsulosin  0.4 mg Oral Daily   Continuous Infusions: . sodium chloride    . heparin 1,650 Units/hr (06/10/16 1841)   PRN Meds: sodium chloride, acetaminophen, loperamide, ondansetron (ZOFRAN) IV, sodium chloride flush, zolpidem   Vital Signs    Vitals:   06/10/16 1500 06/10/16 1932 06/11/16 0511 06/11/16 0902  BP: (!) 151/83 (!) 154/85 (!) 162/93 (!) 156/99  Pulse: 76 82 78 82  Resp: 14 15 16    Temp: 98.6 F (37 C) 98.4 F (36.9 C) 99.1 F (37.3 C)   TempSrc: Oral     SpO2: 99% 99% 99%   Weight:   211 lb 3.2 oz (95.8 kg)   Height:        Intake/Output Summary (Last 24 hours) at 06/11/16 1016 Last data filed at 06/11/16 0936  Gross per 24 hour  Intake              620 ml  Output              600 ml  Net               20 ml   Filed Weights   06/09/16 0933 06/10/16 0509 06/11/16 0511  Weight: 214 lb (97.1 kg) 215 lb (97.5 kg) 211 lb 3.2 oz (95.8 kg)    Telemetry    Sinus rhythm  ECG    Sinus rhythm, non-specific T-wave changes  Physical Exam   GEN: No acute distress.   Neck: No JVD, no bruit Cardiac: RRR, no murmurs, rubs, or gallops.  Respiratory: Clear to auscultation bilaterally. GI: Soft, nontender, non-distended  MS: No edema; No deformity. Neuro:  Nonfocal  Psych: Normal affect   Labs    Chemistry Recent  Labs Lab 06/09/16 0930 06/09/16 0959 06/10/16 0628  NA 139 140 139  K 4.0 3.8 4.4  CL 107 106 106  CO2 21*  --  25  GLUCOSE 157* 174* 163*  BUN 21* 22* 10  CREATININE 0.99 0.80 1.03  CALCIUM 9.1  --  9.2  PROT 6.4*  --   --   ALBUMIN 4.2  --   --   AST 20  --   --   ALT 17  --   --   ALKPHOS 61  --   --   BILITOT 1.1  --   --   GFRNONAA >60  --  >60  GFRAA >60  --  >60  ANIONGAP 11  --  8     Hematology Recent Labs Lab 06/09/16 1012 06/10/16 0628 06/11/16 0528  WBC 7.9 9.4 11.4*  RBC 4.24 4.63 4.98  HGB 13.8 15.2 16.4  HCT 39.2 43.5 46.7  MCV 92.5 94.0 93.8  MCH  32.5 32.8 32.9  MCHC 35.2 34.9 35.1  RDW 12.4 13.0 13.1  PLT 118* 126* 129*    Cardiac Enzymes Recent Labs Lab 06/09/16 0930 06/09/16 1109 06/09/16 1851 06/10/16 0112  TROPONINI 0.09* 0.36* 3.43* 1.66*    Recent Labs Lab 06/09/16 0942  TROPIPOC 0.11*     BNPNo results for input(s): BNP, PROBNP in the last 168 hours.   DDimer No results for input(s): DDIMER in the last 168 hours.   Radiology    No results found.  Cardiac Studies   Cath 5/12:  Prox Cx lesion, 65 %stenosed.  Mid LAD lesion, 75 %stenosed.  Dist LAD lesion, 50 %stenosed.  The left ventricular ejection fraction is 50-55% by visual estimate.  The left ventricular systolic function is normal.  LV end diastolic pressure is normal.    Aborted anterior wall ST elevation myocardial infarction, spontaneously resolution of thrombus.  Mid eccentric 75% LAD stenosis, likely the culprit but with good flow. Decided against intervention given appearance. Beyond the mid lesion there is diffuse 50-70% narrowing in the LAD.  Tortuous circumflex with 50-60% proximal to mid narrowing.  Dominant RCA without evidence of obstructive disease.  Left ventriculography demonstrates distal anterior wall and apical hypokinesis/akinesis likely representing stunned myocardium. Estimated EF 50-60%. Mildly elevated  LVEDP.  RECOMMENDATIONS:   IV nitroglycerin 24 hours  IV heparin to resume without bolus starting 8 hours after sheath removal. Heparin should be administered for 24-48 hours.  High intensity statin therapy.  Patient was loaded with Plavix which should be continued along with aspirin for at least 6 months.  Consider exercise treadmill test prior to discharge to rule out inducible ischemia. If inducible ischemia or chest pain consistent with angina, the LAD should probably be stented electively.  Patient Profile     61 y.o. male with aborted ST elevation MI with chest pain duration less than 20 minutes, elevated BP, and hypergylcemia.  Assessment & Plan    # Aborted anterior MI with mild troponin elevation - with positive troponin - remains on heparin gtt.  NTG gtt stopped yesterday.  No further chest pain.  No orthopnea or SOB.  With resolution of symptoms at rest, will anticipate symptom limited exercise stress test tomorrow to determine if need to plan for further inpatient intervention.  Stop heparin gtt this afternoon.  Continue high intensity statin, ASA, and plavix - increase metoprolol tartrate to 50mg  BID  # Hyperglycemia - A1c pending  # HTN: SBP last 24 hours ranging 140-160s.  Currently on metoprolol alone.Increased to 50 g twice a day - follow BP on increased metoprolol dose.  Can consider addition of Ace/ARB as needed.  Signed, Gwynn Burly, DO  06/11/2016, 10:16 AM     I have seen, examined and evaluated the patient this AM along with Dr. Earlene Plater On morning rounds.  After reviewing all the available data and chart, we discussed the patients laboratory, study & physical findings as well as symptoms in detail. I agree with his findings, examination as well as impression recommendations as per our discussion.    I talked to Dr. Katrinka Blazing about his concerns. The patient presented with what looked like an aborted anterior MI and was found to have moderate to severe  disease in the mid LAD. It was not convincing as a culprit lesion and therefore he was treated medically. Is concerned however that we should evaluate him prior to discharge. He has been asymptomatic from an anginal standpoint but has remained on a heparin drip and  has not been overly ambulatory. He will complete heparin drip this evening  The plan per Dr. Katrinka Blazing was to limited treadmill stress test prior to discharge to see if he has any angina. If so, would then consider LAD PCI, however if negative, he can simply continue medical management.  Adult think doing a Myoview stress test would be warranted in the setting of an MI as it will likely show an abnormality that may be falsely positive in the setting of post infarct penumbra.     Bryan Lemma, M.D., M.S. Interventional Cardiologist   Pager # [phone] Phone # [phone] [address] [place], [place] [address]

## 2016-06-12 ENCOUNTER — Other Ambulatory Visit (HOSPITAL_COMMUNITY): Payer: Self-pay | Admitting: *Deleted

## 2016-06-12 ENCOUNTER — Inpatient Hospital Stay (HOSPITAL_COMMUNITY): Payer: BLUE CROSS/BLUE SHIELD

## 2016-06-12 ENCOUNTER — Encounter (HOSPITAL_COMMUNITY)
Admission: EM | Disposition: A | Discharge status: Home / Self Care | Payer: Self-pay | Source: Home / Self Care | Attending: Interventional Cardiology

## 2016-06-12 DIAGNOSIS — I213 ST elevation (STEMI) myocardial infarction of unspecified site: Secondary | ICD-10-CM

## 2016-06-12 DIAGNOSIS — R9439 Abnormal result of other cardiovascular function study: Secondary | ICD-10-CM

## 2016-06-12 DIAGNOSIS — I10 Essential (primary) hypertension: Secondary | ICD-10-CM

## 2016-06-12 HISTORY — PX: CORONARY STENT INTERVENTION: CATH118234

## 2016-06-12 LAB — BASIC METABOLIC PANEL
ANION GAP: 11 (ref 5–15)
BUN: 14 mg/dL (ref 6–20)
CO2: 22 mmol/L (ref 22–32)
Calcium: 8.9 mg/dL (ref 8.9–10.3)
Chloride: 105 mmol/L (ref 101–111)
Creatinine, Ser: 0.81 mg/dL (ref 0.61–1.24)
Glucose, Bld: 133 mg/dL — ABNORMAL HIGH (ref 65–99)
POTASSIUM: 3.3 mmol/L — AB (ref 3.5–5.1)
SODIUM: 138 mmol/L (ref 135–145)

## 2016-06-12 LAB — POCT ACTIVATED CLOTTING TIME
ACTIVATED CLOTTING TIME: 313 s
ACTIVATED CLOTTING TIME: 219 s

## 2016-06-12 LAB — PROTIME-INR
INR: 1.26
PROTHROMBIN TIME: 15.9 s — AB (ref 11.4–15.2)

## 2016-06-12 LAB — EXERCISE TOLERANCE TEST
CHL CUP MPHR: 158 {beats}/min
CSEPEDS: 1 s
CSEPEW: 7 METS
CSEPPHR: 142 {beats}/min
Exercise duration (min): 6 min
Percent HR: 89 %
Rest HR: 88 {beats}/min

## 2016-06-12 SURGERY — CORONARY STENT INTERVENTION
Anesthesia: LOCAL

## 2016-06-12 MED ORDER — OXYCODONE-ACETAMINOPHEN 5-325 MG PO TABS: 1.0000 | ORAL_TABLET | ORAL | Status: DC | PRN

## 2016-06-12 MED ORDER — FENTANYL CITRATE (PF) 100 MCG/2ML IJ SOLN
INTRAMUSCULAR | Status: DC | PRN
  Administered 2016-06-12 (×2): 50 ug via INTRAVENOUS

## 2016-06-12 MED ORDER — HEPARIN (PORCINE) IN NACL 2-0.9 UNIT/ML-% IJ SOLN
INTRAMUSCULAR | Status: AC
  Filled 2016-06-12: qty 1000

## 2016-06-12 MED ORDER — ONDANSETRON HCL 4 MG/2ML IJ SOLN: 4.0000 mg | Freq: Four times a day (QID) | INTRAMUSCULAR | Status: DC | PRN

## 2016-06-12 MED ORDER — HEPARIN SODIUM (PORCINE) 1000 UNIT/ML IJ SOLN
INTRAMUSCULAR | Status: AC
  Filled 2016-06-12: qty 1

## 2016-06-12 MED ORDER — MIDAZOLAM HCL 2 MG/2ML IJ SOLN
INTRAMUSCULAR | Status: AC
  Filled 2016-06-12: qty 2

## 2016-06-12 MED ORDER — FENTANYL CITRATE (PF) 100 MCG/2ML IJ SOLN
INTRAMUSCULAR | Status: AC
  Filled 2016-06-12: qty 2

## 2016-06-12 MED ORDER — SODIUM CHLORIDE 0.9 % IV SOLN
INTRAVENOUS | Status: AC
  Administered 2016-06-12: 19:00:00 via INTRAVENOUS

## 2016-06-12 MED ORDER — LABETALOL HCL 5 MG/ML IV SOLN: 10.0000 mg | INTRAVENOUS | Status: AC | PRN

## 2016-06-12 MED ORDER — SODIUM CHLORIDE 0.9% FLUSH: 3.0000 mL | Freq: Two times a day (BID) | INTRAVENOUS | Status: DC

## 2016-06-12 MED ORDER — LIDOCAINE HCL (PF) 1 % IJ SOLN
INTRAMUSCULAR | Status: DC | PRN
  Administered 2016-06-12: 3 mL via SUBCUTANEOUS

## 2016-06-12 MED ORDER — NITROGLYCERIN 1 MG/10 ML FOR IR/CATH LAB
INTRA_ARTERIAL | Status: DC | PRN
  Administered 2016-06-12: 200 ug via INTRACORONARY

## 2016-06-12 MED ORDER — HYDRALAZINE HCL 20 MG/ML IJ SOLN: 5.0000 mg | INTRAMUSCULAR | Status: AC | PRN

## 2016-06-12 MED ORDER — CLOPIDOGREL BISULFATE 75 MG PO TABS
75.0000 mg | ORAL_TABLET | Freq: Every day | ORAL | Status: DC
  Administered 2016-06-13: 10:00:00 75 mg via ORAL
  Filled 2016-06-12: qty 1

## 2016-06-12 MED ORDER — SODIUM CHLORIDE 0.9% FLUSH: 3.0000 mL | INTRAVENOUS | Status: DC | PRN

## 2016-06-12 MED ORDER — IOPAMIDOL (ISOVUE-370) INJECTION 76%
INTRAVENOUS | Status: AC
  Filled 2016-06-12: qty 125

## 2016-06-12 MED ORDER — MIDAZOLAM HCL 2 MG/2ML IJ SOLN
INTRAMUSCULAR | Status: DC | PRN
Start: 2016-06-12 — End: 2016-06-12
  Administered 2016-06-12 (×2): 1 mg via INTRAVENOUS

## 2016-06-12 MED ORDER — CARVEDILOL 12.5 MG PO TABS
12.5000 mg | ORAL_TABLET | Freq: Two times a day (BID) | ORAL | Status: DC
  Administered 2016-06-12 – 2016-06-13 (×2): 12.5 mg via ORAL
  Filled 2016-06-12 (×2): qty 1

## 2016-06-12 MED ORDER — VERAPAMIL HCL 2.5 MG/ML IV SOLN
INTRAVENOUS | Status: AC
  Filled 2016-06-12: qty 2

## 2016-06-12 MED ORDER — HYDROMORPHONE HCL 1 MG/ML IJ SOLN
INTRAMUSCULAR | Status: DC | PRN
  Administered 2016-06-12: 0.5 mg via INTRAVENOUS

## 2016-06-12 MED ORDER — HEPARIN SODIUM (PORCINE) 1000 UNIT/ML IJ SOLN
INTRAMUSCULAR | Status: DC | PRN
  Administered 2016-06-12: 9000 [IU] via INTRAVENOUS
  Administered 2016-06-12: 2000 [IU] via INTRAVENOUS

## 2016-06-12 MED ORDER — CLOPIDOGREL BISULFATE 75 MG PO TABS
ORAL_TABLET | ORAL | Status: DC | PRN
  Administered 2016-06-12: 150 mg via ORAL

## 2016-06-12 MED ORDER — ACETAMINOPHEN 325 MG PO TABS
650.0000 mg | ORAL_TABLET | ORAL | Status: DC | PRN
  Administered 2016-06-12: 650 mg via ORAL
  Filled 2016-06-12: qty 2

## 2016-06-12 MED ORDER — SODIUM CHLORIDE 0.9 % IV SOLN: 250.0000 mL | INTRAVENOUS | Status: DC | PRN

## 2016-06-12 MED ORDER — ASPIRIN 81 MG PO CHEW
81.0000 mg | CHEWABLE_TABLET | Freq: Every day | ORAL | Status: DC
  Administered 2016-06-13: 10:00:00 81 mg via ORAL
  Filled 2016-06-12: qty 1

## 2016-06-12 MED ORDER — SODIUM CHLORIDE 0.9 % IV SOLN
INTRAVENOUS | Status: DC
  Administered 2016-06-12: 14:00:00 via INTRAVENOUS

## 2016-06-12 MED ORDER — HYDROMORPHONE HCL 1 MG/ML IJ SOLN
INTRAMUSCULAR | Status: AC
Start: 2016-06-12 — End: 2016-06-12
  Filled 2016-06-12: qty 0.5

## 2016-06-12 MED ORDER — HEPARIN (PORCINE) IN NACL 2-0.9 UNIT/ML-% IJ SOLN
INTRAMUSCULAR | Status: AC | PRN
  Administered 2016-06-12: 1000 mL

## 2016-06-12 MED ORDER — ANGIOPLASTY BOOK
Freq: Once | Status: AC
  Administered 2016-06-12: 21:00:00
  Filled 2016-06-12: qty 1

## 2016-06-12 MED ORDER — NITROGLYCERIN 1 MG/10 ML FOR IR/CATH LAB
INTRA_ARTERIAL | Status: AC
  Filled 2016-06-12: qty 10

## 2016-06-12 MED ORDER — LIDOCAINE HCL (PF) 1 % IJ SOLN
INTRAMUSCULAR | Status: AC
  Filled 2016-06-12: qty 30

## 2016-06-12 MED ORDER — VERAPAMIL HCL 2.5 MG/ML IV SOLN
INTRAVENOUS | Status: DC | PRN
  Administered 2016-06-12: 10 mL via INTRA_ARTERIAL

## 2016-06-12 MED ORDER — IOPAMIDOL (ISOVUE-370) INJECTION 76%
INTRAVENOUS | Status: DC | PRN
  Administered 2016-06-12: 105 mL via INTRA_ARTERIAL

## 2016-06-12 MED ORDER — CLOPIDOGREL BISULFATE 75 MG PO TABS
ORAL_TABLET | ORAL | Status: AC
  Filled 2016-06-12: qty 2

## 2016-06-12 MED FILL — Nitroglycerin IV Soln 100 MCG/ML in D5W: INTRA_ARTERIAL | Qty: 10 | Status: AC

## 2016-06-12 MED FILL — Heparin Sodium (Porcine) Inj 1000 Unit/ML: INTRAMUSCULAR | Qty: 10 | Status: AC

## 2016-06-12 SURGICAL SUPPLY — 18 items
BALLN EMERGE MR 2.5X8 (BALLOONS) ×2
BALLN ~~LOC~~ EMERGE MR 3.25X8 (BALLOONS) ×2
BALLOON EMERGE MR 2.5X8 (BALLOONS) IMPLANT
BALLOON ~~LOC~~ EMERGE MR 3.25X8 (BALLOONS) IMPLANT
CATH VISTA GUIDE 6FR XBLAD3.0 (CATHETERS) ×1 IMPLANT
COVER PRB 48X5XTLSCP FOLD TPE (BAG) IMPLANT
COVER PROBE 5X48 (BAG) ×2
DEVICE RAD COMP TR BAND LRG (VASCULAR PRODUCTS) ×1 IMPLANT
GLIDESHEATH SLEND A-KIT 6F 22G (SHEATH) ×1 IMPLANT
GUIDEWIRE INQWIRE 1.5J.035X260 (WIRE) IMPLANT
INQWIRE 1.5J .035X260CM (WIRE) ×2
KIT ENCORE 26 ADVANTAGE (KITS) ×3 IMPLANT
KIT HEART LEFT (KITS) ×2 IMPLANT
PACK CARDIAC CATHETERIZATION (CUSTOM PROCEDURE TRAY) ×2 IMPLANT
STENT RESOLUTE ONYX 3.0X15 (Permanent Stent) ×1 IMPLANT
TRANSDUCER W/STOPCOCK (MISCELLANEOUS) ×2 IMPLANT
TUBING CIL FLEX 10 FLL-RA (TUBING) ×2 IMPLANT
WIRE MARVEL STR TIP 190CM (WIRE) ×1 IMPLANT

## 2016-06-12 NOTE — Interval H&P Note (Signed)
Cath Lab Visit (complete for each Cath Lab visit)  Clinical Evaluation Leading to the Procedure:   ACS: Yes.    Non-ACS:    Anginal Classification: CCS III  Anti-ischemic medical therapy: Minimal Therapy (1 class of medications)  Non-Invasive Test Results: High-risk stress test findings: cardiac mortality >3%/year  Prior CABG: No previous CABG      History and Physical Interval Note:  06/12/2016 5:02 PM  Tyler Fletcher  has presented today for surgery, with the diagnosis of cad  The various methods of treatment have been discussed with the patient and family. After consideration of risks, benefits and other options for treatment, the patient has consented to  Procedure(s): Coronary Stent Intervention (N/A) as a surgical intervention .  The patient's history has been reviewed, patient examined, no change in status, stable for surgery.  I have reviewed the patient's chart and labs.  Questions were answered to the patient's satisfaction.     Lyn RecordsHenry W Kirke Breach III

## 2016-06-12 NOTE — Progress Notes (Signed)
Progress Note  Patient Name: Tyler Fletcher Date of Encounter: 06/12/2016  Primary Cardiologist: Mendel Ryder, MD   Subjective   No chest pain or sob.  For ETT this am.  Inpatient Medications    Scheduled Meds: . aspirin  81 mg Oral Daily  . atorvastatin  80 mg Oral q1800  . clopidogrel  75 mg Oral Q breakfast  . famotidine  20 mg Oral BID  . fexofenadine  180 mg Oral Daily  . loratadine  10 mg Oral Daily  . metoprolol tartrate  50 mg Oral BID  . sodium chloride flush  3 mL Intravenous Q12H  . tamsulosin  0.4 mg Oral Daily   Continuous Infusions: . sodium chloride     PRN Meds: sodium chloride, acetaminophen, loperamide, ondansetron (ZOFRAN) IV, sodium chloride flush, zolpidem   Vital Signs    Vitals:   06/11/16 0511 06/11/16 0902 06/11/16 1115 06/11/16 1843  BP: (!) 162/93 (!) 156/99 (!) 162/105 (!) 157/80  Pulse: 78 82 95   Resp: 16     Temp: 99.1 F (37.3 C)   99 F (37.2 C)  TempSrc:    Oral  SpO2: 99%   95%  Weight: 211 lb 3.2 oz (95.8 kg)     Height:        Intake/Output Summary (Last 24 hours) at 06/12/16 1015 Last data filed at 06/11/16 2200  Gross per 24 hour  Intake              240 ml  Output                0 ml  Net              240 ml   Filed Weights   06/09/16 0933 06/10/16 0509 06/11/16 0511  Weight: 214 lb (97.1 kg) 215 lb (97.5 kg) 211 lb 3.2 oz (95.8 kg)    Physical Exam   GEN: Well nourished, well developed, in no acute distress.  HEENT: Grossly normal.  Neck: Supple, no JVD, carotid bruits, or masses. Cardiac: RRR, no murmurs, rubs, or gallops. No clubbing, cyanosis, edema.  Radials/DP/PT 2+ and equal bilaterally.  Respiratory:  Respirations regular and unlabored, clear to auscultation bilaterally. GI: Soft, nontender, nondistended, BS + x 4. MS: no deformity or atrophy. Skin: warm and dry, no rash. Neuro:  Strength and sensation are intact. Psych: AAOx3.  Normal affect.  Labs    Chemistry Recent Labs Lab 06/09/16 0930  06/09/16 0959 06/10/16 0628  NA 139 140 139  K 4.0 3.8 4.4  CL 107 106 106  CO2 21*  --  25  GLUCOSE 157* 174* 163*  BUN 21* 22* 10  CREATININE 0.99 0.80 1.03  CALCIUM 9.1  --  9.2  PROT 6.4*  --   --   ALBUMIN 4.2  --   --   AST 20  --   --   ALT 17  --   --   ALKPHOS 61  --   --   BILITOT 1.1  --   --   GFRNONAA >60  --  >60  GFRAA >60  --  >60  ANIONGAP 11  --  8     Hematology Recent Labs Lab 06/09/16 1012 06/10/16 0628 06/11/16 0528  WBC 7.9 9.4 11.4*  RBC 4.24 4.63 4.98  HGB 13.8 15.2 16.4  HCT 39.2 43.5 46.7  MCV 92.5 94.0 93.8  MCH 32.5 32.8 32.9  MCHC 35.2 34.9 35.1  RDW 12.4  13.0 13.1  PLT 118* 126* 129*    Cardiac Enzymes Recent Labs Lab 06/09/16 0930 06/09/16 1109 06/09/16 1851 06/10/16 0112  TROPONINI 0.09* 0.36* 3.43* 1.66*    Recent Labs Lab 06/09/16 0942  TROPIPOC 0.11*     Radiology    No results found.  Telemetry    Seen in nuc med - rsr - Personally Reviewed  Cardiac Studies   Cardiac Catheterization   5.12.2018  Coronary Findings   Dominance: Right  Left Anterior Descending  Mid LAD lesion, 75% stenosed.  Dist LAD lesion, 50% stenosed.  First Diagonal Branch  Vessel is small in size.  Second Diagonal Branch  Vessel is small in size.  Left Circumflex  Prox Cx lesion, 65% stenosed.   Left Ventricle The left ventricular size is normal. The left ventricular systolic function is normal. LV end diastolic pressure is normal. The left ventricular ejection fraction is 50-55% by visual estimate.     Patient Profile     61 y.o. male with aborted ST elevation MI with chest pain duration less than 20 minutes, elevated BP, and hypergylcemia.  Assessment & Plan    1.  Aborted anterior STEMI:  No further c/p.  Known moderate LAD and LCX dzs.  Nl EF.  For ETT today - walked 6 mins and developed diffuse ST dep w/ twi in recovery.  Will review with Dr. Herbie Baltimore.  Keep NPO.  Cont asa, plavix,  blocker, statin.  BP has been  trending high.  Will switch from lopressor to coreg.    2.  Hyperglycemia:  A1c 5.3.  3.  Essential HTN:  bp cont to trend high.  Will switch to coreg.  Plan to add ARB next.  4.  HL:  LDL 82  high potency statin.  Signed, Nicolasa Ducking, NP  06/12/2016, 10:15 AM     I have seen, examined and evaluated the patient this PM along with Mr. Brion Aliment (after GXT).  After reviewing all the available data and chart, we discussed the patients laboratory, study & physical findings as well as symptoms in detail. I agree with his findings, examination as well as impression recommendations as per our discussion.    Patient with an aborted anterior STEMI has not had any further chest pain. He has treadmill stress test today and he walked 6 minutes, however no chest pain. Despite this within 2 minutes into recovery he started having significant anterolateral T-wave inversions concerning for ischemia. I discussed these findings with Dr. Katrinka Blazing to me both agreed that perhaps the best course of action would be to proceed with PCI of the LAD lesion in order to alleviate concerns for possible ischemic LAD lesion.  I went and talked to the patient and his wife in detail she specifically isn't very much in agreement of this plan. He will likely agrees that is probably the best plan. I discussed the procedure with risks benefits alternatives and indications and patient and he agreed to proceed.   We switched from low-dose beta blocker to carvedilol with plan to add ARB tomorrow post cath.  On high potency statin  He has been reloaded on aspirin plus Plavix   Performing MD:  Verdis Prime, M.D.  Procedure:  Percutaneous Coronary Intervention of the LAD  The procedure with Risks/Benefits/Alternatives and Indications was reviewed with the patient and wife.  All questions were answered.    Risks / Complications include, but not limited to: Death, MI, CVA/TIA, VF/VT (with defibrillation), Bradycardia (need for  temporary pacer  placement), contrast induced nephropathy, bleeding / bruising / hematoma / pseudoaneurysm, vascular or coronary injury (with possible emergent CT or Vascular Surgery), adverse medication reactions, infection.  Additional risks involving the use of radiation with the possibility of radiation burns and cancer were explained in detail.  The patient (and family) voice understanding and agree to proceed.      Bryan Lemmaavid Takota Cahalan, M.D., M.S. Interventional Cardiologist   Pager # [phone] Phone # [phone] [address] [place], [place] [id number]

## 2016-06-12 NOTE — H&P (View-Only) (Signed)
Progress Note  Patient Name: Tyler Fletcher Date of Encounter: 06/11/2016  Primary Cardiologist: Clovis RileyMitchell, MD  Subjective   Patient seen and examined this morning.  No further CP, SOB, orthopnea, LE swelling.  States he is NPO this AM for stress test.  Reports some oozing from left AC IV site.  No melena.  Inpatient Medications    Scheduled Meds: . aspirin  81 mg Oral Daily  . atorvastatin  80 mg Oral q1800  . clopidogrel  75 mg Oral Q breakfast  . famotidine  20 mg Oral BID  . fexofenadine  180 mg Oral Daily  . loratadine  10 mg Oral Daily  . metoprolol tartrate  25 mg Oral BID  . sodium chloride flush  3 mL Intravenous Q12H  . tamsulosin  0.4 mg Oral Daily   Continuous Infusions: . sodium chloride    . heparin 1,650 Units/hr (06/10/16 1841)   PRN Meds: sodium chloride, acetaminophen, loperamide, ondansetron (ZOFRAN) IV, sodium chloride flush, zolpidem   Vital Signs    Vitals:   06/10/16 1500 06/10/16 1932 06/11/16 0511 06/11/16 0902  BP: (!) 151/83 (!) 154/85 (!) 162/93 (!) 156/99  Pulse: 76 82 78 82  Resp: 14 15 16    Temp: 98.6 F (37 C) 98.4 F (36.9 C) 99.1 F (37.3 C)   TempSrc: Oral     SpO2: 99% 99% 99%   Weight:   211 lb 3.2 oz (95.8 kg)   Height:        Intake/Output Summary (Last 24 hours) at 06/11/16 1016 Last data filed at 06/11/16 0936  Gross per 24 hour  Intake              620 ml  Output              600 ml  Net               20 ml   Filed Weights   06/09/16 0933 06/10/16 0509 06/11/16 0511  Weight: 214 lb (97.1 kg) 215 lb (97.5 kg) 211 lb 3.2 oz (95.8 kg)    Telemetry    Sinus rhythm  ECG    Sinus rhythm, non-specific T-wave changes  Physical Exam   GEN: No acute distress.   Neck: No JVD, no bruit Cardiac: RRR, no murmurs, rubs, or gallops.  Respiratory: Clear to auscultation bilaterally. GI: Soft, nontender, non-distended  MS: No edema; No deformity. Neuro:  Nonfocal  Psych: Normal affect   Labs    Chemistry Recent  Labs Lab 06/09/16 0930 06/09/16 0959 06/10/16 0628  NA 139 140 139  K 4.0 3.8 4.4  CL 107 106 106  CO2 21*  --  25  GLUCOSE 157* 174* 163*  BUN 21* 22* 10  CREATININE 0.99 0.80 1.03  CALCIUM 9.1  --  9.2  PROT 6.4*  --   --   ALBUMIN 4.2  --   --   AST 20  --   --   ALT 17  --   --   ALKPHOS 61  --   --   BILITOT 1.1  --   --   GFRNONAA >60  --  >60  GFRAA >60  --  >60  ANIONGAP 11  --  8     Hematology Recent Labs Lab 06/09/16 1012 06/10/16 0628 06/11/16 0528  WBC 7.9 9.4 11.4*  RBC 4.24 4.63 4.98  HGB 13.8 15.2 16.4  HCT 39.2 43.5 46.7  MCV 92.5 94.0 93.8  MCH  32.5 32.8 32.9  MCHC 35.2 34.9 35.1  RDW 12.4 13.0 13.1  PLT 118* 126* 129*    Cardiac Enzymes Recent Labs Lab 06/09/16 0930 06/09/16 1109 06/09/16 1851 06/10/16 0112  TROPONINI 0.09* 0.36* 3.43* 1.66*    Recent Labs Lab 06/09/16 0942  TROPIPOC 0.11*     BNPNo results for input(s): BNP, PROBNP in the last 168 hours.   DDimer No results for input(s): DDIMER in the last 168 hours.   Radiology    No results found.  Cardiac Studies   Cath 5/12:  Prox Cx lesion, 65 %stenosed.  Mid LAD lesion, 75 %stenosed.  Dist LAD lesion, 50 %stenosed.  The left ventricular ejection fraction is 50-55% by visual estimate.  The left ventricular systolic function is normal.  LV end diastolic pressure is normal.    Aborted anterior wall ST elevation myocardial infarction, spontaneously resolution of thrombus.  Mid eccentric 75% LAD stenosis, likely the culprit but with good flow. Decided against intervention given appearance. Beyond the mid lesion there is diffuse 50-70% narrowing in the LAD.  Tortuous circumflex with 50-60% proximal to mid narrowing.  Dominant RCA without evidence of obstructive disease.  Left ventriculography demonstrates distal anterior wall and apical hypokinesis/akinesis likely representing stunned myocardium. Estimated EF 50-60%. Mildly elevated  LVEDP.  RECOMMENDATIONS:   IV nitroglycerin 24 hours  IV heparin to resume without bolus starting 8 hours after sheath removal. Heparin should be administered for 24-48 hours.  High intensity statin therapy.  Patient was loaded with Plavix which should be continued along with aspirin for at least 6 months.  Consider exercise treadmill test prior to discharge to rule out inducible ischemia. If inducible ischemia or chest pain consistent with angina, the LAD should probably be stented electively.  Patient Profile     61 y.o. male with aborted ST elevation MI with chest pain duration less than 20 minutes, elevated BP, and hypergylcemia.  Assessment & Plan    # Aborted anterior MI with mild troponin elevation - with positive troponin - remains on heparin gtt.  NTG gtt stopped yesterday.  No further chest pain.  No orthopnea or SOB.  With resolution of symptoms at rest, will anticipate symptom limited exercise stress test tomorrow to determine if need to plan for further inpatient intervention.  Stop heparin gtt this afternoon.  Continue high intensity statin, ASA, and plavix - increase metoprolol tartrate to 50mg  BID  # Hyperglycemia - A1c pending  # HTN: SBP last 24 hours ranging 140-160s.  Currently on metoprolol alone.Increased to 50 g twice a day - follow BP on increased metoprolol dose.  Can consider addition of Ace/ARB as needed.  Signed, Gwynn Burly, DO  06/11/2016, 10:16 AM     I have seen, examined and evaluated the patient this AM along with Dr. Earlene Plater On morning rounds.  After reviewing all the available data and chart, we discussed the patients laboratory, study & physical findings as well as symptoms in detail. I agree with his findings, examination as well as impression recommendations as per our discussion.    I talked to Dr. Katrinka Blazing about his concerns. The patient presented with what looked like an aborted anterior MI and was found to have moderate to severe  disease in the mid LAD. It was not convincing as a culprit lesion and therefore he was treated medically. Is concerned however that we should evaluate him prior to discharge. He has been asymptomatic from an anginal standpoint but has remained on a heparin drip and  has not been overly ambulatory. He will complete heparin drip this evening  The plan per Dr. Katrinka Blazing was to limited treadmill stress test prior to discharge to see if he has any angina. If so, would then consider LAD PCI, however if negative, he can simply continue medical management.  Adult think doing a Myoview stress test would be warranted in the setting of an MI as it will likely show an abnormality that may be falsely positive in the setting of post infarct penumbra.     Bryan Lemma, M.D., M.S. Interventional Cardiologist   Pager # [phone] Phone # [phone] [address] [place], [place] [address]

## 2016-06-12 NOTE — Progress Notes (Signed)
TR BAND REMOVAL  LOCATION:    right radial  DEFLATED PER PROTOCOL:    Yes.    TIME BAND OFF / DRESSING APPLIED:    21:45   SITE UPON ARRIVAL:    Level 0  SITE AFTER BAND REMOVAL:    Level 0  CIRCULATION SENSATION AND MOVEMENT:    Within Normal Limits   Yes.    COMMENTS:   Post TR band instructions given. Pt tolerated well. 

## 2016-06-13 ENCOUNTER — Encounter (HOSPITAL_COMMUNITY): Payer: Self-pay | Admitting: Interventional Cardiology

## 2016-06-13 ENCOUNTER — Telehealth: Payer: Self-pay | Admitting: Nurse Practitioner

## 2016-06-13 LAB — CBC
HEMATOCRIT: 43.8 % (ref 39.0–52.0)
Hemoglobin: 15.5 g/dL (ref 13.0–17.0)
MCH: 33.3 pg (ref 26.0–34.0)
MCHC: 35.4 g/dL (ref 30.0–36.0)
MCV: 94 fL (ref 78.0–100.0)
PLATELETS: 131 10*3/uL — AB (ref 150–400)
RBC: 4.66 MIL/uL (ref 4.22–5.81)
RDW: 13.4 % (ref 11.5–15.5)
WBC: 11.3 10*3/uL — ABNORMAL HIGH (ref 4.0–10.5)

## 2016-06-13 LAB — BASIC METABOLIC PANEL
Anion gap: 8 (ref 5–15)
BUN: 17 mg/dL (ref 6–20)
CHLORIDE: 106 mmol/L (ref 101–111)
CO2: 25 mmol/L (ref 22–32)
CREATININE: 0.87 mg/dL (ref 0.61–1.24)
Calcium: 9 mg/dL (ref 8.9–10.3)
GFR calc Af Amer: >60 mL/min (ref >60)
GLUCOSE: 151 mg/dL — AB (ref 65–99)
POTASSIUM: 3.4 mmol/L — AB (ref 3.5–5.1)
SODIUM: 139 mmol/L (ref 135–145)

## 2016-06-13 MED ORDER — IRBESARTAN 75 MG PO TABS
37.5000 mg | ORAL_TABLET | Freq: Every day | ORAL | Status: DC
  Administered 2016-06-13: 10:00:00 37.5 mg via ORAL
  Filled 2016-06-13: qty 1

## 2016-06-13 MED ORDER — CLOPIDOGREL BISULFATE 75 MG PO TABS: 75.0000 mg | ORAL_TABLET | Freq: Every day | ORAL | 11 refills | Status: DC

## 2016-06-13 MED ORDER — NITROGLYCERIN 0.4 MG SL SUBL: 0.4000 mg | SUBLINGUAL_TABLET | SUBLINGUAL | 3 refills | Status: DC | PRN

## 2016-06-13 MED ORDER — ASPIRIN 81 MG PO CHEW: 81.0000 mg | CHEWABLE_TABLET | Freq: Every day | ORAL | Status: AC

## 2016-06-13 MED ORDER — IRBESARTAN 75 MG PO TABS: 37.5000 mg | ORAL_TABLET | Freq: Every day | ORAL | 3 refills | Status: DC

## 2016-06-13 MED ORDER — POTASSIUM CHLORIDE CRYS ER 20 MEQ PO TBCR
40.0000 meq | EXTENDED_RELEASE_TABLET | Freq: Once | ORAL | Status: AC
  Administered 2016-06-13: 40 meq via ORAL
  Filled 2016-06-13: qty 2

## 2016-06-13 MED ORDER — ATORVASTATIN CALCIUM 80 MG PO TABS: 80.0000 mg | ORAL_TABLET | Freq: Every day | ORAL | 3 refills | Status: DC

## 2016-06-13 MED ORDER — CARVEDILOL 12.5 MG PO TABS: 12.5000 mg | ORAL_TABLET | Freq: Two times a day (BID) | ORAL | 3 refills | Status: DC

## 2016-06-13 NOTE — Progress Notes (Signed)
CARDIAC REHAB PHASE I   PRE:  Rate/Rhythm: 94 SR    BP: sitting 152/83    SaO2:   MODE:  Ambulation: 1000 ft   POST:  Rate/Rhythm: 102 ST    BP: sitting 179/91     SaO2:   Tolerated well, no c/o. BP elevated. Ed completed, understands importance of Plavix/ASA. Discussed risk factors. Will refer to G'sO CRPII. Encouraged pt to get an automatic BP cuff for home to monitor BP. Pt receptive. [id number]   Harriet MassonRandi Kristan Nahla Lukin CES, ACSM 06/13/2016 8:55 AM

## 2016-06-13 NOTE — Care Management Note (Signed)
Case Management Note  Patient Details  Name: Tyler Fletcher MRN: [id number] Date of Birth: 1955-04-04  Subjective/Objective:    From home, s/p coronary stent intervention, will be on plavix.                Action/Plan: NCM will follow for dc needs.  Expected Discharge Date:  06/13/16               Expected Discharge Plan:  Home/Self Care  In-House Referral:     Discharge planning Services  CM Consult  Post Acute Care Choice:    Choice offered to:     DME Arranged:    DME Agency:     HH Arranged:    HH Agency:     Status of Service:  Completed, signed off  If discussed at [employer] Length of Stay Meetings, dates discussed:    Additional Comments:  Leone Havenaylor, Wille Aubuchon Clinton, RN 06/13/2016, 8:20 AM

## 2016-06-13 NOTE — Telephone Encounter (Signed)
New message      TCM appt made on 06-20-16 with Norma FredricksonLori Gerhardt per Lillia AbedLindsay.

## 2016-06-13 NOTE — Discharge Summary (Signed)
Discharge Summary    Patient ID: Tyler Fletcher,  MRN: [id number], DOB/AGE: Aug 04, 1955 61 y.o.  Admit date: 06/09/2016 Discharge date: 06/13/2016  Primary Care Provider: No primary care provider on file. Primary Cardiologist: Katrinka Blazing  Discharge Diagnoses    Principal Problem:   Aborted myocardial infarction [facility]) Active Problems:   Acute ST elevation myocardial infarction (STEMI) involving left anterior descending (LAD) coronary artery (HCC)   STEMI (ST elevation myocardial infarction) (HCC)   Hyperlipidemia LDL goal <70   Coronary artery disease involving native coronary artery of native heart with angina pectoris (HCC)   Allergies No Known Allergies  Diagnostic Studies/Procedures    LHC: 06/09/16  Conclusion     Prox Cx lesion, 65 %stenosed.  Mid LAD lesion, 75 %stenosed.  Dist LAD lesion, 50 %stenosed.  The left ventricular ejection fraction is 50-55% by visual estimate.  The left ventricular systolic function is normal.  LV end diastolic pressure is normal.    Aborted anterior wall ST elevation myocardial infarction, spontaneously resolution of thrombus.  Mid eccentric 75% LAD stenosis, likely the culprit but with good flow. Decided against intervention given appearance. Beyond the mid lesion there is diffuse 50-70% narrowing in the LAD.  Tortuous circumflex with 50-60% proximal to mid narrowing.  Dominant RCA without evidence of obstructive disease.  Left ventriculography demonstrates distal anterior wall and apical hypokinesis/akinesis likely representing stunned myocardium. Estimated EF 50-60%. Mildly elevated LVEDP.  RECOMMENDATIONS:   IV nitroglycerin 24 hours  IV heparin to resume without bolus starting 8 hours after sheath removal. Heparin should be administered for 24-48 hours.  High intensity statin therapy.  Patient was loaded with Plavix which should be continued along with aspirin for at least 6 months.  Consider exercise  treadmill test prior to discharge to rule out inducible ischemia. If inducible ischemia or chest pain consistent with angina, the LAD should probably be stented electively.    LHC: 06/12/16  Conclusion    Successful stent mid LAD reducing a 90% stenosis to 0% with TIMI grade 3 flow. 3.0 x 15 Onyx was post dilated to 3.25 mm in diameter.  Recommendations:   Continue aspirin and Plavix  Likely discharge in a.m.   _____________   History of Present Illness     61 year old healthy gentleman with history of blood pressure elevation but no chronic illnesses requiring medication therapy. He worked hard in his yard around his property the day prior to admission. Had some shoulder discomfort that evening that was new and unusual but fleeting. The morning of admission after reaching up into his closet he developed left pectoral discomfort, nausea, diaphoresis, and a sense of apprehension. Because of his symptoms emergency medical services was called. An EKG was performed and demonstrated ST elevation in V3 through 6 without reciprocal change. Upon arrival and with the administration of nitroglycerin, chest discomfort gradually abated. Total duration of discomfort was less than 20 minutes. He was pain-free at the time of evaluation in the emergency room. Blood pressure is extremely elevated.  Hospital Course     He was taken to the cath lab emergently for cardiac catheterization noted above with 75% mLAD lesion. Decided against intervention given appearance. HE was continued on nitro for 24 hours, and heparin the same. Started on high dose statin, and loaded with plavix/ASA. He was sent for Exercise Treadmill Test to determined need for PCI. Remained hypertensive, and was converted to coreg for better BP control. While after walking on treadmill developed diffuse ST depressions with  TWI while in recovery. Given this finding he was taken for cardiac cath with PCI and DES x1 to mLAD. Post cath labs were  stable with Cr 0.87 and Hgb 15.5. Felt well post cath. Worked well with cardiac rehab. Irbesartan 37.5mg  was added on the day of discharge.   He was seen by Dr. Herbie Baltimore and determined stable for discharge home. Follow up in the office has been arranged. Medications are listed below.  _____________  Discharge Vitals Blood pressure (!) 163/87, pulse 78, temperature 98.1 F (36.7 C), temperature source Oral, resp. rate 12, height 6\' 3"  (1.905 m), weight 207 lb 3.7 oz (94 kg), SpO2 99 %.  Filed Weights   06/10/16 0509 06/11/16 0511 06/13/16 0437  Weight: 215 lb (97.5 kg) 211 lb 3.2 oz (95.8 kg) 207 lb 3.7 oz (94 kg)  General appearance: alert, cooperative, appears stated age, no distress. HEENT: [place]/AT, EOMI, MMM, anicteric sclera Neck: no adenopathy, no carotid bruit and no JVD Lungs: clear to auscultation bilaterally, normal percussion bilaterally and non-labored Heart: regular rate and rhythm, S1 &S2 normal, no murmur, click, rub or gallop;  Abdomen: soft, non-tender; bowel sounds normal; no masses,  no organomegaly;  Extremities: extremities normal, atraumatic, no cyanosis, and edema - none Neurologic: Mental status: Alert & oriented x 3, thought content appropriate; non-focal exam.  Pleasant mood & affect. Cranial nerves: normal (II-XII grossly intact)   Labs & Radiologic Studies    CBC  Recent Labs  06/11/16 0528 06/13/16 0428  WBC 11.4* 11.3*  HGB 16.4 15.5  HCT 46.7 43.8  MCV 93.8 94.0  PLT 129* 131*   Basic Metabolic Panel  Recent Labs  06/12/16 1845 06/13/16 0428  NA 138 139  K 3.3* 3.4*  CL 105 106  CO2 22 25  GLUCOSE 133* 151*  BUN 14 17  CREATININE 0.81 0.87  CALCIUM 8.9 9.0   Liver Function Tests No results for input(s): AST, ALT, ALKPHOS, BILITOT, PROT, ALBUMIN in the last 72 hours. No results for input(s): LIPASE, AMYLASE in the last 72 hours. Cardiac Enzymes No results for input(s): CKTOTAL, CKMB, CKMBINDEX, TROPONINI in the last 72  hours. BNP Invalid input(s): POCBNP D-Dimer No results for input(s): DDIMER in the last 72 hours. Hemoglobin A1C  Recent Labs  06/10/16 1150  HGBA1C 5.3   Fasting Lipid Panel No results for input(s): CHOL, HDL, LDLCALC, TRIG, CHOLHDL, LDLDIRECT in the last 72 hours. Thyroid Function Tests No results for input(s): TSH, T4TOTAL, T3FREE, THYROIDAB in the last 72 hours.  Invalid input(s): FREET3 _____________  No results found. Disposition   Pt is being discharged home today in good condition.  Follow-up Plans & Appointments    Follow-up Information    Rosalio Macadamia, NP Follow up on 06/20/2016.   Specialties:  Nurse Practitioner, Interventional Cardiology, Cardiology, Radiology Why:  at 2pm for your hospital follow up appt. Please arrive by 1:45 to check in Contact information: [address] [place] [address] [phone]          Discharge Instructions    Call MD for:  redness, tenderness, or signs of infection (pain, swelling, redness, odor or green/yellow discharge around incision site)    Complete by:  As directed    Diet - low sodium heart healthy    Complete by:  As directed    Discharge instructions    Complete by:  As directed    Radial Site Care Refer to this sheet in the next few weeks. These instructions  provide you with information on caring for yourself after your procedure. Your caregiver may also give you more specific instructions. Your treatment has been planned according to current medical practices, but problems sometimes occur. Call your caregiver if you have any problems or questions after your procedure. HOME CARE INSTRUCTIONS You may shower the day after the procedure.Remove the bandage (dressing) and gently wash the site with plain soap and water.Gently pat the site dry.  Do not apply powder or lotion to the site.  Do not submerge the affected site in water for 3 to 5 days.  Inspect the site at least twice daily.  Do  not flex or bend the affected arm for 24 hours.  No lifting over 5 pounds (2.3 kg) for 5 days after your procedure.  Do not drive home if you are discharged the same day of the procedure. Have someone else drive you.  You may drive 24 hours after the procedure unless otherwise instructed by your caregiver.  What to expect: Any bruising will usually fade within 1 to 2 weeks.  Blood that collects in the tissue (hematoma) may be painful to the touch. It should usually decrease in size and tenderness within 1 to 2 weeks.  SEEK IMMEDIATE MEDICAL CARE IF: You have unusual pain at the radial site.  You have redness, warmth, swelling, or pain at the radial site.  You have drainage (other than a small amount of blood on the dressing).  You have chills.  You have a fever or persistent symptoms for more than 72 hours.  You have a fever and your symptoms suddenly get worse.  Your arm becomes pale, cool, tingly, or numb.  You have heavy bleeding from the site. Hold pressure on the site.   Increase activity slowly    Complete by:  As directed       Discharge Medications   Current Discharge Medication List    START taking these medications   Details  aspirin 81 MG chewable tablet Chew 1 tablet (81 mg total) by mouth daily.    atorvastatin (LIPITOR) 80 MG tablet Take 1 tablet (80 mg total) by mouth daily at 6 PM. Qty: 30 tablet, Refills: 3    carvedilol (COREG) 12.5 MG tablet Take 1 tablet (12.5 mg total) by mouth 2 (two) times daily with a meal. Qty: 60 tablet, Refills: 3    clopidogrel (PLAVIX) 75 MG tablet Take 1 tablet (75 mg total) by mouth daily with breakfast. Qty: 30 tablet, Refills: 11    irbesartan (AVAPRO) 75 MG tablet Take 0.5 tablets (37.5 mg total) by mouth daily. Qty: 30 tablet, Refills: 3    nitroGLYCERIN (NITROSTAT) 0.4 MG SL tablet Place 1 tablet (0.4 mg total) under the tongue every 5 (five) minutes as needed. Qty: 25 tablet, Refills: 3      CONTINUE these medications  which have NOT CHANGED   Details  loratadine (CLARITIN) 10 MG tablet Take 10 mg by mouth daily.    ranitidine (ZANTAC) 150 MG tablet Take 150 mg by mouth 2 (two) times daily.    tamsulosin (FLOMAX) 0.4 MG CAPS capsule Take 0.4 mg by mouth daily.         Aspirin prescribed at discharge?  Yes High Intensity Statin Prescribed? (Lipitor 40-80mg  or Crestor 20-40mg ): Yes Beta Blocker Prescribed? Yes For EF <40%, was ACEI/ARB Prescribed? Yes ADP Receptor Inhibitor Prescribed? (i.e. Plavix etc.-Includes Medically Managed Patients): Yes For EF <40%, Aldosterone Inhibitor Prescribed? No: EF ok Was EF assessed during THIS  hospitalization? Yes Was Cardiac Rehab II ordered? (Included Medically managed Patients): Yes   Outstanding Labs/Studies   FLP/LFTs in 6 week, BMET at follow up appt.   Duration of Discharge Encounter   Greater than 30 minutes including physician time.  Signed, Laverda PageLindsay Roberts NP-C 06/13/2016, 8:13 AM    I did see and evaluate Mr. Lamorte in the postprocedure unit today. He was feeling notably better. All around people are happy that we did decide to proceed with PCI as he had had progression from initial Findings of the LAD lesion. He walked in the hall without any difficulty. He is relatively stable. We are making minimal just as to his medications as noted in the summary above.  He'll be seen in close follow-up for medication adjustment etc.  Bryan Lemmaavid Harding, M.D., M.S. Interventional Cardiologist   Pager # [phone] Phone # [phone] [address] [place], [place] [address]

## 2016-06-15 NOTE — Telephone Encounter (Signed)
Patient contacted regarding [facility] on Wednesday, 06/13/16.  Patient understands to follow up with provider Norma FredricksonLori Gerhardt  on 06/20/16 at 2:00 pm at [facility] [address] Office. Patient understands his discharge instructions. Patient understands his medications and regiment. Patient understands to bring all of his medications to this visit.  No c/o chest pain, dizziness or sob.

## 2016-06-20 ENCOUNTER — Ambulatory Visit (INDEPENDENT_AMBULATORY_CARE_PROVIDER_SITE_OTHER): Payer: BLUE CROSS/BLUE SHIELD | Admitting: Nurse Practitioner

## 2016-06-20 ENCOUNTER — Encounter: Payer: Self-pay | Admitting: Nurse Practitioner

## 2016-06-20 VITALS — BP 138/82 | HR 70 | Ht 75.0 in | Wt 216.1 lb

## 2016-06-20 DIAGNOSIS — I259 Chronic ischemic heart disease, unspecified: Secondary | ICD-10-CM

## 2016-06-20 NOTE — Patient Instructions (Addendum)
We will be checking the following labs today - BMET & CBC   Medication Instructions:    Continue with your current medicines.     Testing/Procedures To Be Arranged:  N/A  Follow-Up:   See Dr. Katrinka BlazingSmith in about 4 to 6 weeks with fasting labs    Other Special Instructions:   I have sent a message to cardiac rehab.  Ok to increase your walking - goal is 45 minutes each day    If you need a refill on your cardiac medications before your next appointment, please call your pharmacy.   Call the [facility] office at [phone] if you have any questions, problems or concerns.

## 2016-06-20 NOTE — Progress Notes (Signed)
CARDIOLOGY OFFICE NOTE  Date:  06/20/2016    Tyler Fletcher Date of Birth: 02-Dec-1955 Medical Record #[id number]  PCP:  Clovis Riley, L.August Saucer, MD  Cardiologist:  Kyra Manges    Chief Complaint  Patient presents with  . Coronary Artery Disease    TOC visit - seen for Dr. Katrinka Blazing    History of Present Illness: Tyler Fletcher is a 61 y.o. male who presents today for a TOC visit. Seen for Dr. Katrinka Blazing.   He has a history of elevated BP but no chronic illnesses requiring medication therapy.   Presented earlier this month to [facility] by EMS with chest pain. An EKG was performed and demonstrated ST elevation in V3 through 6 without reciprocal change. Upon arrival and with the administration of nitroglycerin, chest discomfort gradually abated. Total duration of discomfort was less than 20 minutes. He was pain-free at the time of initial cardiac evaluation in the emergency room. Blood pressure was extremely elevated.  He was taken to the cath lab emergently for cardiac catheterization and noted to have 75% mLAD lesion. Decided against intervention given appearance. He was continued on nitro & heparin for 24 hours. Started on high dose statin, and loaded with plavix/ASA. Remained hypertensive, and was converted to coreg for better BP control. He was sent for GXT to determined need for PCI. While after walking on treadmill developed diffuse ST depressions with TWI while in recovery. Given this finding he was taken for cardiac cath with PCI and DES x1 to mLAD. Did well post procedure. LV function noted to be normal by cath.   Comes in today. Here with his wife. Has been home about a week. No problems. No chest pain. Breathing is good. Some bruising in the left forearm - this is improving - this was due to what sounds like an IV infiltrating. His cath site from the right wrist are both just fine. He is walking. Stopped smoking about a year ago. Tolerating his medicines. Has no real concerns and feels like  he is doing well.   Past Medical History:  Diagnosis Date  . Blood pressure elevated without history of HTN 06/09/2016  . CAD (coronary artery disease), native coronary artery    06/12/16 PCI w/ DES -->mLAD  . Hyperlipidemia     Past Surgical History:  Procedure Laterality Date  . CORONARY STENT INTERVENTION N/A 06/12/2016   Procedure: Coronary Stent Intervention;  Surgeon: Lyn Records, MD;  Location: [facility] INVASIVE CV LAB;  Service: Cardiovascular;  Laterality: N/A;  . LEFT HEART CATH AND CORONARY ANGIOGRAPHY N/A 06/09/2016   Procedure: Left Heart Cath and Coronary Angiography;  Surgeon: Lyn Records, MD;  Location[facility] INVASIVE CV LAB;  Service: Cardiovascular;  Laterality: N/A;  . None       Medications: Current Outpatient Prescriptions  Medication Sig Dispense Refill  . aspirin 81 MG chewable tablet Chew 1 tablet (81 mg total) by mouth daily.    Marland Kitchen atorvastatin (LIPITOR) 80 MG tablet Take 1 tablet (80 mg total) by mouth daily at 6 PM. 30 tablet 3  . carvedilol (COREG) 12.5 MG tablet Take 1 tablet (12.5 mg total) by mouth 2 (two) times daily with a meal. 60 tablet 3  . clopidogrel (PLAVIX) 75 MG tablet Take 1 tablet (75 mg total) by mouth daily with breakfast. 30 tablet 11  . irbesartan (AVAPRO) 75 MG tablet Take 0.5 tablets (37.5 mg total) by mouth daily. 30 tablet 3  . loratadine (CLARITIN) 10 MG  tablet Take 10 mg by mouth daily.    . nitroGLYCERIN (NITROSTAT) 0.4 MG SL tablet Place 1 tablet (0.4 mg total) under the tongue every 5 (five) minutes as needed. 25 tablet 3  . ranitidine (ZANTAC) 150 MG tablet Take 150 mg by mouth 2 (two) times daily.    . tamsulosin (FLOMAX) 0.4 MG CAPS capsule Take 0.4 mg by mouth daily.     No current facility-administered medications for this visit.     Allergies: No Known Allergies  Social History: The patient  reports that he has never smoked. He has never used smokeless tobacco. He reports that he does not drink alcohol.   Family  History: The patient's family history includes Heart attack in his father; Heart disease in his father; Hypertension in his mother.   Review of Systems: Please see the history of present illness.   Otherwise, the review of systems is positive for none.   All other systems are reviewed and negative.   Physical Exam: VS:  BP 138/82 (BP Location: Left Arm, Patient Position: Sitting, Cuff Size: Normal)   Pulse 70   Ht 6\' 3"  (1.905 m)   Wt 216 lb 1.9 oz (98 kg)   BMI 27.01 kg/m  .  BMI Body mass index is 27.01 kg/m.  Wt Readings from Last 3 Encounters:  06/20/16 216 lb 1.9 oz (98 kg)  06/13/16 207 lb 3.7 oz (94 kg)    General: Pleasant. Well developed, well nourished and in no acute distress.   HEENT: Normal.  Neck: Supple, no JVD, carotid bruits, or masses noted.  Cardiac: Regular rate and rhythm. No murmurs, rubs, or gallops. No edema.  Respiratory:  Lungs are clear to auscultation bilaterally with normal work of breathing.  GI: Soft and nontender.  MS: No deformity or atrophy. Gait and ROM intact.  Skin: Warm and dry. Color is normal.  Neuro:  Strength and sensation are intact and no gross focal deficits noted.  Psych: Alert, appropriate and with normal affect. His cath sites (x2) in the right wrist are ok. Bruising in the left forearm noted.    LABORATORY DATA:  EKG:  EKG is ordered today. NSR with diffuse anterior T wave changes noted.   Lab Results  Component Value Date   WBC 11.3 (H) 06/13/2016   HGB 15.5 06/13/2016   HCT 43.8 06/13/2016   PLT 131 (L) 06/13/2016   GLUCOSE 151 (H) 06/13/2016   CHOL 155 06/09/2016   TRIG 176 (H) 06/09/2016   HDL 38 (L) 06/09/2016   LDLCALC 82 06/09/2016   ALT 17 06/09/2016   AST 20 06/09/2016   NA 139 06/13/2016   K 3.4 (L) 06/13/2016   CL 106 06/13/2016   CREATININE 0.87 06/13/2016   BUN 17 06/13/2016   CO2 25 06/13/2016   INR 1.26 06/12/2016   HGBA1C 5.3 06/10/2016   Lab Results  Component Value Date   TROPONINI 1.66  (HH) 06/10/2016    BNP (last 3 results) No results for input(s): BNP in the last 8760 hours.  ProBNP (last 3 results) No results for input(s): PROBNP in the last 8760 hours.   Other Studies Reviewed Today:  LHC: 06/09/16  Conclusion     Prox Cx lesion, 65 %stenosed.  Mid LAD lesion, 75 %stenosed.  Dist LAD lesion, 50 %stenosed.  The left ventricular ejection fraction is 50-55% by visual estimate.  The left ventricular systolic function is normal.  LV end diastolic pressure is normal.   Aborted anterior wall ST  elevation myocardial infarction, spontaneously resolution of thrombus.  Mid eccentric 75% LAD stenosis, likely the culprit but with good flow. Decided against intervention given appearance. Beyond the mid lesion there is diffuse 50-70% narrowing in the LAD.  Tortuous circumflex with 50-60% proximal to mid narrowing.  Dominant RCA without evidence of obstructive disease.  Left ventriculography demonstrates distal anterior wall and apical hypokinesis/akinesis likely representing stunned myocardium. Estimated EF 50-60%. Mildly elevated LVEDP.  RECOMMENDATIONS:   IV nitroglycerin 24 hours  IV heparin to resume without bolus starting 8 hours after sheath removal. Heparin should be administered for 24-48 hours.  High intensity statin therapy.  Patient was loaded with Plavix which should be continued along with aspirin for at least 6 months.  Consider exercise treadmill test prior to discharge to rule out inducible ischemia. If inducible ischemia or chest pain consistent with angina, the LAD should probably be stented electively.    LHC: 06/12/16  Conclusion    Successful stent mid LAD reducing a 90% stenosis to 0% with TIMI grade 3 flow. 3.0 x 15 Onyx was post dilated to 3.25 mm in diameter.  Recommendations:   Continue aspirin and Plavix  Likely discharge in a.m.     Assessment/Plan:  1. Recent STEMI with subsequent PCI/stent to the  LAD - he is felt to be doing well clinically. Ok to go to cardiac rehab. Reviewed CV risk factor modifications that are needed. Follow up lab today. No change with his current regimen.   2. HTN - BP looks good on his current regimen. They are planning on getting a monitor to follow at home as well. No changes made today.   3. HLD - now on high dose therapy - will need labs on return  4. Prior smoker    Current medicines are reviewed with the patient today.  The patient does not have concerns regarding medicines other than what has been noted above.  The following changes have been made:  See above.  Labs/ tests ordered today include:   No orders of the defined types were placed in this encounter.    Disposition:   FU with Dr. Katrinka BlazingSmith in about 4 to 6 weeks with fasting labs.   Patient is agreeable to this plan and will call if any problems develop in the interim.   SignedNorma Fredrickson: Roseline Ebarb, NP  06/20/2016 2:36 PM  [facility] [address] [place], [place]  [address] Phone: [phone] Fax: [phone]

## 2016-06-21 ENCOUNTER — Encounter (HOSPITAL_COMMUNITY): Payer: Self-pay

## 2016-06-21 ENCOUNTER — Telehealth (HOSPITAL_COMMUNITY): Payer: Self-pay

## 2016-06-21 LAB — BASIC METABOLIC PANEL
BUN/Creatinine Ratio: 12 (ref 10–24)
BUN: 14 mg/dL (ref 8–27)
CO2: 27 mmol/L (ref 18–29)
Calcium: 9.2 mg/dL (ref 8.6–10.2)
Chloride: 99 mmol/L (ref 96–106)
Creatinine, Ser: 1.19 mg/dL (ref 0.76–1.27)
GFR calc Af Amer: 75 mL/min/{1.73_m2} (ref >59)
GFR calc non Af Amer: 65 mL/min/{1.73_m2} (ref >59)
Glucose: 97 mg/dL (ref 65–99)
Potassium: 4.4 mmol/L (ref 3.5–5.2)
Sodium: 139 mmol/L (ref 134–144)

## 2016-06-21 LAB — CBC
Hematocrit: 43.1 % (ref 37.5–51.0)
Hemoglobin: 15.2 g/dL (ref 13.0–17.7)
MCH: 33.3 pg — ABNORMAL HIGH (ref 26.6–33.0)
MCHC: 35.3 g/dL (ref 31.5–35.7)
MCV: 95 fL (ref 79–97)
Platelets: 176 10*3/uL (ref 150–379)
RBC: 4.56 x10E6/uL (ref 4.14–5.80)
RDW: 14 % (ref 12.3–15.4)
WBC: 9.8 10*3/uL (ref 3.4–10.8)

## 2016-06-21 NOTE — Telephone Encounter (Signed)
I called patient discuss scheduling for orientation and cardiac rehab classes. Patient voicemail is full and unable to leave a message. I will try again later to contact patient. I have mailed patient a letter and brochure about cardiac rehab.

## 2016-06-22 ENCOUNTER — Telehealth: Payer: Self-pay | Admitting: Nurse Practitioner

## 2016-06-22 NOTE — Telephone Encounter (Signed)
Patient returning your call.

## 2016-07-03 ENCOUNTER — Other Ambulatory Visit: Payer: Self-pay | Admitting: *Deleted

## 2016-07-03 MED ORDER — IRBESARTAN 75 MG PO TABS: 37.5000 mg | ORAL_TABLET | Freq: Every day | ORAL | 3 refills | Status: DC

## 2016-07-03 MED ORDER — ATORVASTATIN CALCIUM 80 MG PO TABS
80.0000 mg | ORAL_TABLET | Freq: Every day | ORAL | 3 refills | Status: DC
Start: 2016-07-03 — End: 2016-08-22

## 2016-07-03 MED ORDER — CLOPIDOGREL BISULFATE 75 MG PO TABS: 75.0000 mg | ORAL_TABLET | Freq: Every day | ORAL | 3 refills | Status: DC

## 2016-07-03 MED ORDER — CARVEDILOL 12.5 MG PO TABS: 12.5000 mg | ORAL_TABLET | Freq: Two times a day (BID) | ORAL | 3 refills | Status: DC

## 2016-07-04 ENCOUNTER — Telehealth (HOSPITAL_COMMUNITY): Payer: Self-pay

## 2016-07-04 NOTE — Telephone Encounter (Signed)
Update: Verified [employer] insurance benefits through [employer]. No Copay, Coinsurance 30%, Deductible $500.00 which pt has met all... Out of Pocket $3500.00, pt has met all.... Reference [phone].... KJ

## 2016-07-05 ENCOUNTER — Encounter (HOSPITAL_COMMUNITY): Payer: Self-pay

## 2016-07-05 ENCOUNTER — Encounter (HOSPITAL_COMMUNITY)
Admission: RE | Admit: 2016-07-05 | Discharge: 2016-07-05 | Disposition: A | Discharge status: Home / Self Care | Payer: BLUE CROSS/BLUE SHIELD | Source: Ambulatory Visit | Attending: Interventional Cardiology | Admitting: Interventional Cardiology

## 2016-07-05 VITALS — BP 132/88 | HR 68 | Ht 74.0 in | Wt 216.3 lb

## 2016-07-05 DIAGNOSIS — I2102 ST elevation (STEMI) myocardial infarction involving left anterior descending coronary artery: Secondary | ICD-10-CM

## 2016-07-05 DIAGNOSIS — I200 Unstable angina: Secondary | ICD-10-CM | POA: Diagnosis not present

## 2016-07-05 DIAGNOSIS — Z955 Presence of coronary angioplasty implant and graft: Secondary | ICD-10-CM | POA: Diagnosis present

## 2016-07-05 NOTE — Progress Notes (Signed)
Cardiac Rehab Medication Review by a Pharmacist  Does the patient  feel that his/her medications are working for him/her?  yes  Has the patient been experiencing any side effects to the medications prescribed?  no  Does the patient measure his/her own blood pressure or blood glucose at home?  yes   Does the patient have any problems obtaining medications due to transportation or finances?   no  Understanding of regimen: good Understanding of indications: good Potential of compliance: good  Pharmacist comments: [age] YOM presenting for cardiac rehab orientation in good spirits. He manages his own medications and does not have issues with compliance. He doesn't have any complaints about his medications. He asked if he would have to take these medications forever and we discussed that the plavix may be stopped after 6 months to a year based on his cardiologist.  Gwyndolyn KaufmanKai Cari Burgo Bernette Redbird(Kenny), PharmD  PGY1 Pharmacy Resident Pager: [phone] 07/05/2016 8:21 AM

## 2016-07-05 NOTE — Progress Notes (Signed)
Cardiac Individual Treatment Plan  Patient Details  Name: Tyler Fletcher MRN: [id number] Date of Birth: May 10, 1955 Referring Provider:     CARDIAC REHAB PHASE II ORIENTATION from 07/05/2016 in [facility] CARD[facility]AC REHAB  Referring Provider  Verdis Prime MD      Initial Encounter Date:    CARDIAC REHAB PHASE II ORIENTATION from 07/05/2016 in [facility] CARDIAC REHAB  Date  07/05/16  Referring Provider  Verdis Prime MD      Visit Diagnosis: 06/09/16 ST elevation myocardial infarction involving left anterior descending (LAD) coronary artery (HCC)  06/12/16 Status post coronary artery stent placement  Patient's Home Medications on Admission:  Current Outpatient Prescriptions:  .  aspirin 81 MG chewable tablet, Chew 1 tablet (81 mg total) by mouth daily., Disp: , Rfl:  .  atorvastatin (LIPITOR) 80 MG tablet, Take 1 tablet (80 mg total) by mouth daily at 6 PM., Disp: 90 tablet, Rfl: 3 .  carvedilol (COREG) 12.5 MG tablet, Take 1 tablet (12.5 mg total) by mouth 2 (two) times daily with a meal., Disp: 180 tablet, Rfl: 3 .  clopidogrel (PLAVIX) 75 MG tablet, Take 1 tablet (75 mg total) by mouth daily with breakfast., Disp: 90 tablet, Rfl: 3 .  irbesartan (AVAPRO) 75 MG tablet, Take 0.5 tablets (37.5 mg total) by mouth daily., Disp: 45 tablet, Rfl: 3 .  loratadine (CLARITIN) 10 MG tablet, Take 10 mg by mouth daily., Disp: , Rfl:  .  nitroGLYCERIN (NITROSTAT) 0.4 MG SL tablet, Place 1 tablet (0.4 mg total) under the tongue every 5 (five) minutes as needed., Disp: 25 tablet, Rfl: 3 .  ranitidine (ZANTAC) 150 MG tablet, Take 150 mg by mouth daily as needed. , Disp: , Rfl:  .  tamsulosin (FLOMAX) 0.4 MG CAPS capsule, Take 0.4 mg by mouth daily., Disp: , Rfl:   Past Medical History: Past Medical History:  Diagnosis Date  . Blood pressure elevated without history of HTN 06/09/2016  . CAD (coronary artery disease), native coronary artery    06/12/16 PCI w/ DES -->mLAD   . Hyperlipidemia     Tobacco Use: History  Smoking Status  . Never Smoker  Smokeless Tobacco  . Never Used    Labs: Recent Review Flowsheet Data    Labs for ITP Cardiac and Pulmonary Rehab Latest Ref Rng & Units 06/09/2016 06/10/2016   Cholestrol 0 - 200 mg/dL 409 -   LDLCALC 0 - 99 mg/dL 82 -   HDL >81 mg/dL 19(J) -   Trlycerides <478 mg/dL 295(A) -   Hemoglobin O1H 4.8 - 5.6 % 5.4 5.3   TCO2 0 - 100 mmol/L 23 -      Capillary Blood Glucose: No results found for: GLUCAP   Exercise Target Goals: Date: 07/05/16  Exercise Program Goal: Individual exercise prescription set with THRR, safety & activity barriers. Participant demonstrates ability to understand and report RPE using BORG scale, to self-measure pulse accurately, and to acknowledge the importance of the exercise prescription.  Exercise Prescription Goal: Starting with aerobic activity 30 plus minutes a day, 3 days per week for initial exercise prescription. Provide home exercise prescription and guidelines that participant acknowledges understanding prior to discharge.  Activity Barriers & Risk Stratification:     Activity Barriers & Cardiac Risk Stratification - 07/05/16 0824      Activity Barriers & Cardiac Risk Stratification   Activity Barriers Arthritis;Other (comment);Back Problems   Comments arthritis in L knee, lower back and R RTC   Cardiac  Risk Stratification High      6 Minute Walk:     6 Minute Walk    Row Name 07/05/16 0921 07/05/16 1010       6 Minute Walk   Phase Initial  -    Distance 1767 feet  -    Walk Time 6 minutes  -    # of Rest Breaks 0  -    MPH 3.35  -    METS 4.1  -    RPE 9  -    VO2 Peak 14.4  -    Symptoms No  -    Resting HR 68 bpm  -    Resting BP 132/88  -    Max Ex. HR 87 bpm  -    Max Ex. BP 130/86  -    2 Minute Post BP  - 110/76       Oxygen Initial Assessment:   Oxygen Re-Evaluation:   Oxygen Discharge (Final Oxygen Re-Evaluation):   Initial  Exercise Prescription:     Initial Exercise Prescription - 07/05/16 0900      Date of Initial Exercise RX and Referring Provider   Date 07/05/16   Referring Provider Verdis PrimeSmith, Henry MD     Treadmill   MPH 3.2   Grade 1   Minutes 10   METs 3.89     Bike   Level 1.2   Minutes 10   METs 3.26     NuStep   Level 3   SPM 80   Minutes 10   METs 2.5     Prescription Details   Frequency (times per week) 3   Duration Progress to 30 minutes of continuous aerobic without signs/symptoms of physical distress     Intensity   THRR 40-80% of Max Heartrate 63-126   Ratings of Perceived Exertion 11-13   Perceived Dyspnea 0-4     Progression   Progression Continue to progress workloads to maintain intensity without signs/symptoms of physical distress.     Resistance Training   Training Prescription Yes   Weight 5lbs   Reps 10-15      Perform Capillary Blood Glucose checks as needed.  Exercise Prescription Changes:   Exercise Comments:   Exercise Goals and Review:     Exercise Goals    Row Name 07/05/16 0825             Exercise Goals   Increase Physical Activity Yes       Intervention Provide advice, education, support and counseling about physical activity/exercise needs.;Develop an individualized exercise prescription for aerobic and resistive training based on initial evaluation findings, risk stratification, comorbidities and participant's personal goals.       Expected Outcomes Achievement of increased cardiorespiratory fitness and enhanced flexibility, muscular endurance and strength shown through measurements of functional capacity and personal statement of participant.       Increase Strength and Stamina Yes  learn exercise limitations and learn risk factors       Intervention Provide advice, education, support and counseling about physical activity/exercise needs.;Develop an individualized exercise prescription for aerobic and resistive training based on initial  evaluation findings, risk stratification, comorbidities and participant's personal goals.       Expected Outcomes Achievement of increased cardiorespiratory fitness and enhanced flexibility, muscular endurance and strength shown through measurements of functional capacity and personal statement of participant.          Exercise Goals Re-Evaluation :    Discharge Exercise Prescription (Final Exercise  Prescription Changes):   Nutrition:  Target Goals: Understanding of nutrition guidelines, daily intake of sodium 1500mg , cholesterol 200mg , calories 30% from fat and 7% or less from saturated fats, daily to have 5 or more servings of fruits and vegetables.  Biometrics:     Pre Biometrics - 07/05/16 1610      Pre Biometrics   Height 6\' 2"  (1.88 m)   Weight 216 lb 4.3 oz (98.1 kg)   Waist Circumference 43 inches   Hip Circumference 42.5 inches   Waist to Hip Ratio 1.01 %   BMI (Calculated) 27.8   Triceps Skinfold 20 mm   % Body Fat 29.4 %   Grip Strength 35 kg   Flexibility 13 in   Single Leg Stand 30 seconds       Nutrition Therapy Plan and Nutrition Goals:   Nutrition Discharge: Nutrition Scores:   Nutrition Goals Re-Evaluation:   Nutrition Goals Re-Evaluation:   Nutrition Goals Discharge (Final Nutrition Goals Re-Evaluation):   Psychosocial: Target Goals: Acknowledge presence or absence of significant depression and/or stress, maximize coping skills, provide positive support system. Participant is able to verbalize types and ability to use techniques and skills needed for reducing stress and depression.  Initial Review & Psychosocial Screening:     Initial Psych Review & Screening - 07/05/16 1204      Initial Review   Current issues with Current Stress Concerns   Source of Stress Concerns Family   Comments Pt has 17 y o son; Father paseed away on the day of pt cardiac event/Stemi     Family Dynamics   Good Support System? Yes   Concerns Recent loss of  significant other   Comments father passed away - lived in [facility]     Barriers   Psychosocial barriers to participate in program The patient should benefit from training in stress management and relaxation.      Quality of Life Scores:   PHQ-9: Recent Review Flowsheet Data    There is no flowsheet data to display.     Interpretation of Total Score  Total Score Depression Severity:  1-4 = Minimal depression, 5-9 = Mild depression, 10-14 = Moderate depression, 15-19 = Moderately severe depression, 20-27 = Severe depression   Psychosocial Evaluation and Intervention:   Psychosocial Re-Evaluation:   Psychosocial Discharge (Final Psychosocial Re-Evaluation):   Vocational Rehabilitation: Provide vocational rehab assistance to qualifying candidates.   Vocational Rehab Evaluation & Intervention:   Education: Education Goals: Education classes will be provided on a weekly basis, covering required topics. Participant will state understanding/return demonstration of topics presented.  Learning Barriers/Preferences:     Learning Barriers/Preferences - 07/05/16 9604      Learning Barriers/Preferences   Learning Barriers Sight   Learning Preferences Skilled Demonstration      Education Topics: Count Your Pulse:  -Group instruction provided by verbal instruction, demonstration, patient participation and written materials to support subject.  Instructors address importance of being able to find your pulse and how to count your pulse when at home without a heart monitor.  Patients get hands on experience counting their pulse with staff help and individually.   Heart Attack, Angina, and Risk Factor Modification:  -Group instruction provided by verbal instruction, video, and written materials to support subject.  Instructors address signs and symptoms of angina and heart attacks.    Also discuss risk factors for heart disease and how to make changes to improve heart health risk  factors.   Functional Fitness:  -Group instruction  provided by verbal instruction, demonstration, patient participation, and written materials to support subject.  Instructors address safety measures for doing things around the house.  Discuss how to get up and down off the floor, how to pick things up properly, how to safely get out of a chair without assistance, and balance training.   Meditation and Mindfulness:  -Group instruction provided by verbal instruction, patient participation, and written materials to support subject.  Instructor addresses importance of mindfulness and meditation practice to help reduce stress and improve awareness.  Instructor also leads participants through a meditation exercise.    Stretching for Flexibility and Mobility:  -Group instruction provided by verbal instruction, patient participation, and written materials to support subject.  Instructors lead participants through series of stretches that are designed to increase flexibility thus improving mobility.  These stretches are additional exercise for major muscle groups that are typically performed during regular warm up and cool down.   Hands Only CPR:  -Group verbal, video, and participation provides a basic overview of AHA guidelines for community CPR. Role-play of emergencies allow participants the opportunity to practice calling for help and chest compression technique with discussion of AED use.   Hypertension: -Group verbal and written instruction that provides a basic overview of hypertension including the most recent diagnostic guidelines, risk factor reduction with self-care instructions and medication management.    Nutrition I class: Heart Healthy Eating:  -Group instruction provided by PowerPoint slides, verbal discussion, and written materials to support subject matter. The instructor gives an explanation and review of the Therapeutic Lifestyle Changes diet recommendations, which includes a  discussion on lipid goals, dietary fat, sodium, fiber, plant stanol/sterol esters, sugar, and the components of a well-balanced, healthy diet.   Nutrition II class: Lifestyle Skills:  -Group instruction provided by PowerPoint slides, verbal discussion, and written materials to support subject matter. The instructor gives an explanation and review of label reading, grocery shopping for heart health, heart healthy recipe modifications, and ways to make healthier choices when eating out.   Diabetes Question & Answer:  -Group instruction provided by PowerPoint slides, verbal discussion, and written materials to support subject matter. The instructor gives an explanation and review of diabetes co-morbidities, pre- and post-prandial blood glucose goals, pre-exercise blood glucose goals, signs, symptoms, and treatment of hypoglycemia and hyperglycemia, and foot care basics.   Diabetes Blitz:  -Group instruction provided by PowerPoint slides, verbal discussion, and written materials to support subject matter. The instructor gives an explanation and review of the physiology behind type 1 and type 2 diabetes, diabetes medications and rational behind using different medications, pre- and post-prandial blood glucose recommendations and Hemoglobin A1c goals, diabetes diet, and exercise including blood glucose guidelines for exercising safely.    Portion Distortion:  -Group instruction provided by PowerPoint slides, verbal discussion, written materials, and food models to support subject matter. The instructor gives an explanation of serving size versus portion size, changes in portions sizes over the last 20 years, and what consists of a serving from each food group.   Stress Management:  -Group instruction provided by verbal instruction, video, and written materials to support subject matter.  Instructors review role of stress in heart disease and how to cope with stress positively.     Exercising on Your  Own:  -Group instruction provided by verbal instruction, power point, and written materials to support subject.  Instructors discuss benefits of exercise, components of exercise, frequency and intensity of exercise, and end points for exercise.  Also discuss  use of nitroglycerin and activating EMS.  Review options of places to exercise outside of rehab.  Review guidelines for sex with heart disease.   Cardiac Drugs I:  -Group instruction provided by verbal instruction and written materials to support subject.  Instructor reviews cardiac drug classes: antiplatelets, anticoagulants, beta blockers, and statins.  Instructor discusses reasons, side effects, and lifestyle considerations for each drug class.   Cardiac Drugs II:  -Group instruction provided by verbal instruction and written materials to support subject.  Instructor reviews cardiac drug classes: angiotensin converting enzyme inhibitors (ACE-I), angiotensin II receptor blockers (ARBs), nitrates, and calcium channel blockers.  Instructor discusses reasons, side effects, and lifestyle considerations for each drug class.   Anatomy and Physiology of the Circulatory System:  Group verbal and written instruction and models provide basic cardiac anatomy and physiology, with the coronary electrical and arterial systems. Review of: AMI, Angina, Valve disease, Heart Failure, Peripheral Artery Disease, Cardiac Arrhythmia, Pacemakers, and the ICD.   Other Education:  -Group or individual verbal, written, or video instructions that support the educational goals of the cardiac rehab program.   Knowledge Questionnaire Score:   Core Components/Risk Factors/Patient Goals at Admission:     Personal Goals and Risk Factors at Admission - 07/05/16 1011      Core Components/Risk Factors/Patient Goals on Admission    Weight Management Yes;Weight Maintenance;Weight Loss   Intervention Weight Management: Develop a combined nutrition and exercise program  designed to reach desired caloric intake, while maintaining appropriate intake of nutrient and fiber, sodium and fats, and appropriate energy expenditure required for the weight goal.;Weight Management: Provide education and appropriate resources to help participant work on and attain dietary goals.;Weight Management/Obesity: Establish reasonable short term and long term weight goals.   Expected Outcomes Short Term: Continue to assess and modify interventions until short term weight is achieved;Long Term: Adherence to nutrition and physical activity/exercise program aimed toward attainment of established weight goal;Weight Maintenance: Understanding of the daily nutrition guidelines, which includes 25-35% calories from fat, 7% or less cal from saturated fats, less than 200mg  cholesterol, less than 1.5gm of sodium, & 5 or more servings of fruits and vegetables daily;Weight Loss: Understanding of general recommendations for a balanced deficit meal plan, which promotes 1-2 lb weight loss per week and includes a negative energy balance of [phone] kcal/d;Understanding of distribution of calorie intake throughout the day with the consumption of 4-5 meals/snacks;Understanding recommendations for meals to include 15-35% energy as protein, 25-35% energy from fat, 35-60% energy from carbohydrates, less than 200mg  of dietary cholesterol, 20-35 gm of total fiber daily   Stress Yes   Intervention Offer individual and/or small group education and counseling on adjustment to heart disease, stress management and health-related lifestyle change. Teach and support self-help strategies.;Refer participants experiencing significant psychosocial distress to appropriate mental health specialists for further evaluation and treatment. When possible, include family members and significant others in education/counseling sessions.   Expected Outcomes Short Term: Participant demonstrates changes in health-related behavior, relaxation and  other stress management skills, ability to obtain effective social support, and compliance with psychotropic medications if prescribed.;Long Term: Emotional wellbeing is indicated by absence of clinically significant psychosocial distress or social isolation.   Personal Goal Other Yes   Personal Goal Learn risk factor modifications and improve knowledge on CVD   Intervention Provide cardiac education classes geared towards reducing modifiable risk factors and improving awareness on CVD's   Expected Outcomes Pt will have increase understanding of CVD's and apply risk factor modifications to  lufestyle to reduce future occurrences of CVD's.      Core Components/Risk Factors/Patient Goals Review:    Core Components/Risk Factors/Patient Goals at Discharge (Final Review):    ITP Comments:     ITP Comments    Row Name 07/05/16 0819           ITP Comments Medical Director, Dr. Armanda Magic          Comments:  Patient attended orientation from 0800 to 0930 to review rules and guidelines for program. Completed 6 minute walk test, Intitial ITP, and exercise prescription.  VSS. Telemetry-SR.  Asymptomatic. Brief Psychosocial Assessment reveals supportive family. Pt recently lost his father who was declining in health for several months.  Patient father lived in [place] and he spent a great deal of time traveling back and forth. Pt found this to be very stressful.  Pt has a 36 yo son and he is involved in a great number of extracurricular activties. Pt is loking forward to beginning CR and feels more at ease now that he has been to the orientation appt. Alanson Aly, BSN Cardiac and Emergency planning/management officer

## 2016-07-09 ENCOUNTER — Encounter (HOSPITAL_COMMUNITY): Payer: BLUE CROSS/BLUE SHIELD

## 2016-07-11 ENCOUNTER — Encounter (HOSPITAL_COMMUNITY)
Admission: RE | Admit: 2016-07-11 | Discharge: 2016-07-11 | Disposition: A | Discharge status: Home / Self Care | Payer: BLUE CROSS/BLUE SHIELD | Source: Ambulatory Visit | Attending: Interventional Cardiology | Admitting: Interventional Cardiology

## 2016-07-11 ENCOUNTER — Encounter (HOSPITAL_COMMUNITY): Payer: BLUE CROSS/BLUE SHIELD

## 2016-07-11 DIAGNOSIS — Z955 Presence of coronary angioplasty implant and graft: Secondary | ICD-10-CM | POA: Diagnosis not present

## 2016-07-11 DIAGNOSIS — I2102 ST elevation (STEMI) myocardial infarction involving left anterior descending coronary artery: Secondary | ICD-10-CM

## 2016-07-11 NOTE — Progress Notes (Signed)
Daily Session Note  Patient Details  Name: Tyler Fletcher MRN: [id number] Date of Birth: October 19, 1955 Referring Provider:     CARDIAC REHAB PHASE II ORIENTATION from 07/05/2016 in [facility]  Referring Provider  Daneen Schick MD      Encounter Date: 07/11/2016  Check In:     Session Check In - 07/11/16 1239      Check-In   Location [facility]   Staff Present Cleda Mccreedy, MS, Exercise Physiologist;Olinty Celesta Aver, MS, ACSM CEP, Exercise Physiologist;Maria Whitaker, RN, BSN;Carlette Wilber Oliphant, RN, BSN   Supervising physician immediately available to respond to emergencies [facility] immediately available   Physician(s) Dr. Sloan Leiter   Medication changes reported     No   Fall or balance concerns reported    No   Tobacco Cessation No Change   Warm-up and Cool-down Performed as group-led instruction   Resistance Training Performed No   VAD Patient? No     Pain Assessment   Currently in Pain? No/denies      Capillary Blood Glucose: No results found for this or any previous visit (from the past 24 hour(s)).    History  Smoking Status  . Never Smoker  Smokeless Tobacco  . Never Used    Goals Met:  Exercise tolerated well Personal goals reviewed No report of cardiac concerns or symptoms  Goals Unmet:  O2 Sat Not Applicable  Comments:  Pt started full exercise in phase II cardiac rehab today.  Pt tolerated light exercise without difficulty. VSS, telemetry-SR, asymptomatic.  Medication list reconciled. Pt denies barriers to medication compliance.  PSYCHOSOCIAL ASSESSMENT:  PHQ-0. Pt exhibits positive coping skills, hopeful outlook with supportive family.  No psychosocial needs identified at this time, no psychosocial interventions necessary.    Pt enjoys playing golf, watching kids play basketball. Pt desires to learn risk factor modifications and exercise limitations.  Pt oriented to exercise equipment and routine.  Understanding verbalized. [name] RN, BSN Cardiac and Pulmonary Rehab Nurse Navigator      Dr. Fransico Him is Medical Director for Cardiac Rehab at [facility].

## 2016-07-16 ENCOUNTER — Encounter (HOSPITAL_COMMUNITY): Payer: BLUE CROSS/BLUE SHIELD

## 2016-07-16 ENCOUNTER — Encounter (HOSPITAL_COMMUNITY)
Admission: RE | Admit: 2016-07-16 | Discharge: 2016-07-16 | Disposition: A | Discharge status: Home / Self Care | Payer: BLUE CROSS/BLUE SHIELD | Source: Ambulatory Visit | Attending: Interventional Cardiology | Admitting: Interventional Cardiology

## 2016-07-16 DIAGNOSIS — I2102 ST elevation (STEMI) myocardial infarction involving left anterior descending coronary artery: Secondary | ICD-10-CM

## 2016-07-16 DIAGNOSIS — Z955 Presence of coronary angioplasty implant and graft: Secondary | ICD-10-CM

## 2016-07-16 NOTE — Progress Notes (Signed)
Cardiac Individual Treatment Plan  Patient Details  Name: CALLAHAN WILD MRN: [id number] Date of Birth: 02/16/1955 Referring Provider:     CARDIAC REHAB PHASE II ORIENTATION from 07/05/2016 in [facility] CARDIAC REHAB  Referring Provider  Verdis Prime MD      Initial Encounter Date:    CARDIAC REHAB PHASE II ORIENTATION from 07/05/2016 in [facility] CARDIAC REHAB  Date  07/05/16  Referring Provider  Verdis Prime MD      Visit Diagnosis: 06/09/16 ST elevation myocardial infarction involving left anterior descending (LAD) coronary artery (HCC)  06/12/16 Status post coronary artery stent placement  Patient's Home Medications on Admission:  Current Outpatient Prescriptions:  .  aspirin 81 MG chewable tablet, Chew 1 tablet (81 mg total) by mouth daily., Disp: , Rfl:  .  atorvastatin (LIPITOR) 80 MG tablet, Take 1 tablet (80 mg total) by mouth daily at 6 PM., Disp: 90 tablet, Rfl: 3 .  carvedilol (COREG) 12.5 MG tablet, Take 1 tablet (12.5 mg total) by mouth 2 (two) times daily with a meal., Disp: 180 tablet, Rfl: 3 .  clopidogrel (PLAVIX) 75 MG tablet, Take 1 tablet (75 mg total) by mouth daily with breakfast., Disp: 90 tablet, Rfl: 3 .  irbesartan (AVAPRO) 75 MG tablet, Take 0.5 tablets (37.5 mg total) by mouth daily., Disp: 45 tablet, Rfl: 3 .  loratadine (CLARITIN) 10 MG tablet, Take 10 mg by mouth daily., Disp: , Rfl:  .  nitroGLYCERIN (NITROSTAT) 0.4 MG SL tablet, Place 1 tablet (0.4 mg total) under the tongue every 5 (five) minutes as needed., Disp: 25 tablet, Rfl: 3 .  ranitidine (ZANTAC) 150 MG tablet, Take 150 mg by mouth daily as needed. , Disp: , Rfl:  .  tamsulosin (FLOMAX) 0.4 MG CAPS capsule, Take 0.4 mg by mouth daily., Disp: , Rfl:   Past Medical History: Past Medical History:  Diagnosis Date  . Blood pressure elevated without history of HTN 06/09/2016  . CAD (coronary artery disease), native coronary artery    06/12/16 PCI w/ DES -->mLAD   . Hyperlipidemia     Tobacco Use: History  Smoking Status  . Never Smoker  Smokeless Tobacco  . Never Used    Labs: Recent Review Flowsheet Data    Labs for ITP Cardiac and Pulmonary Rehab Latest Ref Rng & Units 06/09/2016 06/10/2016   Cholestrol 0 - 200 mg/dL 725 -   LDLCALC 0 - 99 mg/dL 82 -   HDL >36 mg/dL 64(Q) -   Trlycerides <034 mg/dL 742(V) -   Hemoglobin Z5G 4.8 - 5.6 % 5.4 5.3   TCO2 0 - 100 mmol/L 23 -      Capillary Blood Glucose: No results found for: GLUCAP   Exercise Target Goals:    Exercise Program Goal: Individual exercise prescription set with THRR, safety & activity barriers. Participant demonstrates ability to understand and report RPE using BORG scale, to self-measure pulse accurately, and to acknowledge the importance of the exercise prescription.  Exercise Prescription Goal: Starting with aerobic activity 30 plus minutes a day, 3 days per week for initial exercise prescription. Provide home exercise prescription and guidelines that participant acknowledges understanding prior to discharge.  Activity Barriers & Risk Stratification:     Activity Barriers & Cardiac Risk Stratification - 07/05/16 0824      Activity Barriers & Cardiac Risk Stratification   Activity Barriers Arthritis;Other (comment);Back Problems   Comments arthritis in L knee, lower back and R RTC   Cardiac  Risk Stratification High      6 Minute Walk:     6 Minute Walk    Row Name 07/05/16 0921 07/05/16 1010       6 Minute Walk   Phase Initial  -    Distance 1767 feet  -    Walk Time 6 minutes  -    # of Rest Breaks 0  -    MPH 3.35  -    METS 4.1  -    RPE 9  -    VO2 Peak 14.4  -    Symptoms No  -    Resting HR 68 bpm  -    Resting BP 132/88  -    Max Ex. HR 87 bpm  -    Max Ex. BP 130/86  -    2 Minute Post BP  - 110/76       Oxygen Initial Assessment:   Oxygen Re-Evaluation:   Oxygen Discharge (Final Oxygen Re-Evaluation):   Initial Exercise  Prescription:     Initial Exercise Prescription - 07/05/16 0900      Date of Initial Exercise RX and Referring Provider   Date 07/05/16   Referring Provider Verdis PrimeSmith, Henry MD     Treadmill   MPH 3.2   Grade 1   Minutes 10   METs 3.89     Bike   Level 1.2   Minutes 10   METs 3.26     NuStep   Level 3   SPM 80   Minutes 10   METs 2.5     Prescription Details   Frequency (times per week) 3   Duration Progress to 30 minutes of continuous aerobic without signs/symptoms of physical distress     Intensity   THRR 40-80% of Max Heartrate 63-126   Ratings of Perceived Exertion 11-13   Perceived Dyspnea 0-4     Progression   Progression Continue to progress workloads to maintain intensity without signs/symptoms of physical distress.     Resistance Training   Training Prescription Yes   Weight 5lbs   Reps 10-15      Perform Capillary Blood Glucose checks as needed.  Exercise Prescription Changes:      Exercise Prescription Changes    Row Name 07/17/16 1200             Response to Exercise   Blood Pressure (Admit) 120/64       Blood Pressure (Exercise) 134/72       Blood Pressure (Exit) 112/80       Heart Rate (Admit) 78 bpm       Heart Rate (Exercise) 106 bpm       Heart Rate (Exit) 78 bpm       Rating of Perceived Exertion (Exercise) 11       Symptoms none       Comments Pt was oriented to exercise equipment and responded well to exercise       Duration Continue with 30 min of aerobic exercise without signs/symptoms of physical distress.       Intensity THRR unchanged         Progression   Progression Continue to progress workloads to maintain intensity without signs/symptoms of physical distress.       Average METs 3.2         Resistance Training   Training Prescription Yes       Weight 4lbs       Reps 10-15  Time 10 Minutes         Treadmill   MPH 3.2       Grade 1       Minutes 10       METs 3.89         Bike   Level 1.2        Minutes 10       METs 3.26         NuStep   Level 3       SPM 80       Minutes 10       METs 2.5          Exercise Comments:      Exercise Comments    Row Name 07/17/16 1226           Exercise Comments Pt is responding well to exercise in cardiac rehab. Will continue to f/u with pt's activty levels and exercise progression          Exercise Goals and Review:      Exercise Goals    Row Name 07/05/16 0825             Exercise Goals   Increase Physical Activity Yes       Intervention Provide advice, education, support and counseling about physical activity/exercise needs.;Develop an individualized exercise prescription for aerobic and resistive training based on initial evaluation findings, risk stratification, comorbidities and participant's personal goals.       Expected Outcomes Achievement of increased cardiorespiratory fitness and enhanced flexibility, muscular endurance and strength shown through measurements of functional capacity and personal statement of participant.       Increase Strength and Stamina Yes  learn exercise limitations and learn risk factors       Intervention Provide advice, education, support and counseling about physical activity/exercise needs.;Develop an individualized exercise prescription for aerobic and resistive training based on initial evaluation findings, risk stratification, comorbidities and participant's personal goals.       Expected Outcomes Achievement of increased cardiorespiratory fitness and enhanced flexibility, muscular endurance and strength shown through measurements of functional capacity and personal statement of participant.          Exercise Goals Re-Evaluation :    Discharge Exercise Prescription (Final Exercise Prescription Changes):     Exercise Prescription Changes - 07/17/16 1200      Response to Exercise   Blood Pressure (Admit) 120/64   Blood Pressure (Exercise) 134/72   Blood Pressure (Exit) 112/80    Heart Rate (Admit) 78 bpm   Heart Rate (Exercise) 106 bpm   Heart Rate (Exit) 78 bpm   Rating of Perceived Exertion (Exercise) 11   Symptoms none   Comments Pt was oriented to exercise equipment and responded well to exercise   Duration Continue with 30 min of aerobic exercise without signs/symptoms of physical distress.   Intensity THRR unchanged     Progression   Progression Continue to progress workloads to maintain intensity without signs/symptoms of physical distress.   Average METs 3.2     Resistance Training   Training Prescription Yes   Weight 4lbs   Reps 10-15   Time 10 Minutes     Treadmill   MPH 3.2   Grade 1   Minutes 10   METs 3.89     Bike   Level 1.2   Minutes 10   METs 3.26     NuStep   Level 3   SPM 80   Minutes 10  METs 2.5      Nutrition:  Target Goals: Understanding of nutrition guidelines, daily intake of sodium 1500mg , cholesterol 200mg , calories 30% from fat and 7% or less from saturated fats, daily to have 5 or more servings of fruits and vegetables.  Biometrics:     Pre Biometrics - 07/05/16 6962      Pre Biometrics   Height 6\' 2"  (1.88 m)   Weight 216 lb 4.3 oz (98.1 kg)   Waist Circumference 43 inches   Hip Circumference 42.5 inches   Waist to Hip Ratio 1.01 %   BMI (Calculated) 27.8   Triceps Skinfold 20 mm   % Body Fat 29.4 %   Grip Strength 35 kg   Flexibility 13 in   Single Leg Stand 30 seconds       Nutrition Therapy Plan and Nutrition Goals:     Nutrition Therapy & Goals - 07/17/16 0935      Nutrition Therapy   Diet Therapeutic Lifestyle Changes     Personal Nutrition Goals   Nutrition Goal Wt loss of 1-2 lb/week to a wt loss goal of 5 lb at graduation from Cardiac Rehab     Intervention Plan   Intervention Prescribe, educate and counsel regarding individualized specific dietary modifications aiming towards targeted core components such as weight, hypertension, lipid management, diabetes, heart failure and  other comorbidities.   Expected Outcomes Short Term Goal: Understand basic principles of dietary content, such as calories, fat, sodium, cholesterol and nutrients.;Long Term Goal: Adherence to prescribed nutrition plan.      Nutrition Discharge: Nutrition Scores:   Nutrition Goals Re-Evaluation:   Nutrition Goals Re-Evaluation:   Nutrition Goals Discharge (Final Nutrition Goals Re-Evaluation):   Psychosocial: Target Goals: Acknowledge presence or absence of significant depression and/or stress, maximize coping skills, provide positive support system. Participant is able to verbalize types and ability to use techniques and skills needed for reducing stress and depression.  Initial Review & Psychosocial Screening:     Initial Psych Review & Screening - 07/05/16 1204      Initial Review   Current issues with Current Stress Concerns   Source of Stress Concerns Family   Comments Pt has 53 y o son; Father paseed away on the day of pt cardiac event/Stemi     Family Dynamics   Good Support System? Yes   Concerns Recent loss of significant other   Comments father passed away - lived in [facility]     Barriers   Psychosocial barriers to participate in program The patient should benefit from training in stress management and relaxation.      Quality of Life Scores:   PHQ-9: Recent Review Flowsheet Data    Depression screen [facility] 2/9 07/11/2016   Decreased Interest 0   Down, Depressed, Hopeless 0   PHQ - 2 Score 0     Interpretation of Total Score  Total Score Depression Severity:  1-4 = Minimal depression, 5-9 = Mild depression, 10-14 = Moderate depression, 15-19 = Moderately severe depression, 20-27 = Severe depression   Psychosocial Evaluation and Intervention:     Psychosocial Evaluation - 07/16/16 2304      Psychosocial Evaluation & Interventions   Interventions Stress management education;Relaxation education   Comments Pt with supportive family, feels good about life.    Continue Psychosocial Services  Follow up required by staff      Psychosocial Re-Evaluation:   Psychosocial Discharge (Final Psychosocial Re-Evaluation):   Vocational Rehabilitation: Provide vocational rehab assistance to qualifying candidates.  Vocational Rehab Evaluation & Intervention:   Education: Education Goals: Education classes will be provided on a weekly basis, covering required topics. Participant will state understanding/return demonstration of topics presented.  Learning Barriers/Preferences:     Learning Barriers/Preferences - 07/05/16 4098      Learning Barriers/Preferences   Learning Barriers Sight   Learning Preferences Skilled Demonstration      Education Topics: Count Your Pulse:  -Group instruction provided by verbal instruction, demonstration, patient participation and written materials to support subject.  Instructors address importance of being able to find your pulse and how to count your pulse when at home without a heart monitor.  Patients get hands on experience counting their pulse with staff help and individually.   Heart Attack, Angina, and Risk Factor Modification:  -Group instruction provided by verbal instruction, video, and written materials to support subject.  Instructors address signs and symptoms of angina and heart attacks.    Also discuss risk factors for heart disease and how to make changes to improve heart health risk factors.   Functional Fitness:  -Group instruction provided by verbal instruction, demonstration, patient participation, and written materials to support subject.  Instructors address safety measures for doing things around the house.  Discuss how to get up and down off the floor, how to pick things up properly, how to safely get out of a chair without assistance, and balance training.   Meditation and Mindfulness:  -Group instruction provided by verbal instruction, patient participation, and written materials to  support subject.  Instructor addresses importance of mindfulness and meditation practice to help reduce stress and improve awareness.  Instructor also leads participants through a meditation exercise.    Stretching for Flexibility and Mobility:  -Group instruction provided by verbal instruction, patient participation, and written materials to support subject.  Instructors lead participants through series of stretches that are designed to increase flexibility thus improving mobility.  These stretches are additional exercise for major muscle groups that are typically performed during regular warm up and cool down.   Hands Only CPR:  -Group verbal, video, and participation provides a basic overview of AHA guidelines for community CPR. Role-play of emergencies allow participants the opportunity to practice calling for help and chest compression technique with discussion of AED use.   Hypertension: -Group verbal and written instruction that provides a basic overview of hypertension including the most recent diagnostic guidelines, risk factor reduction with self-care instructions and medication management.    Nutrition I class: Heart Healthy Eating:  -Group instruction provided by PowerPoint slides, verbal discussion, and written materials to support subject matter. The instructor gives an explanation and review of the Therapeutic Lifestyle Changes diet recommendations, which includes a discussion on lipid goals, dietary fat, sodium, fiber, plant stanol/sterol esters, sugar, and the components of a well-balanced, healthy diet.   Nutrition II class: Lifestyle Skills:  -Group instruction provided by PowerPoint slides, verbal discussion, and written materials to support subject matter. The instructor gives an explanation and review of label reading, grocery shopping for heart health, heart healthy recipe modifications, and ways to make healthier choices when eating out.   Diabetes Question & Answer:   -Group instruction provided by PowerPoint slides, verbal discussion, and written materials to support subject matter. The instructor gives an explanation and review of diabetes co-morbidities, pre- and post-prandial blood glucose goals, pre-exercise blood glucose goals, signs, symptoms, and treatment of hypoglycemia and hyperglycemia, and foot care basics.   Diabetes Blitz:  -Group instruction provided by [employer], verbal discussion,  and written materials to support subject matter. The instructor gives an explanation and review of the physiology behind type 1 and type 2 diabetes, diabetes medications and rational behind using different medications, pre- and post-prandial blood glucose recommendations and Hemoglobin A1c goals, diabetes diet, and exercise including blood glucose guidelines for exercising safely.    Portion Distortion:  -Group instruction provided by PowerPoint slides, verbal discussion, written materials, and food models to support subject matter. The instructor gives an explanation of serving size versus portion size, changes in portions sizes over the last 20 years, and what consists of a serving from each food group.   Stress Management:  -Group instruction provided by verbal instruction, video, and written materials to support subject matter.  Instructors review role of stress in heart disease and how to cope with stress positively.     Exercising on Your Own:  -Group instruction provided by verbal instruction, power point, and written materials to support subject.  Instructors discuss benefits of exercise, components of exercise, frequency and intensity of exercise, and end points for exercise.  Also discuss use of nitroglycerin and activating EMS.  Review options of places to exercise outside of rehab.  Review guidelines for sex with heart disease.   Cardiac Drugs I:  -Group instruction provided by verbal instruction and written materials to support subject.   Instructor reviews cardiac drug classes: antiplatelets, anticoagulants, beta blockers, and statins.  Instructor discusses reasons, side effects, and lifestyle considerations for each drug class.   Cardiac Drugs II:  -Group instruction provided by verbal instruction and written materials to support subject.  Instructor reviews cardiac drug classes: angiotensin converting enzyme inhibitors (ACE-I), angiotensin II receptor blockers (ARBs), nitrates, and calcium channel blockers.  Instructor discusses reasons, side effects, and lifestyle considerations for each drug class.   CARDIAC REHAB PHASE II EXERCISE from 07/11/2016 in [facility] CARDIAC REHAB  Date  07/11/16  Instruction Review Code  2- meets goals/outcomes      Anatomy and Physiology of the Circulatory System:  Group verbal and written instruction and models provide basic cardiac anatomy and physiology, with the coronary electrical and arterial systems. Review of: AMI, Angina, Valve disease, Heart Failure, Peripheral Artery Disease, Cardiac Arrhythmia, Pacemakers, and the ICD.   Other Education:  -Group or individual verbal, written, or video instructions that support the educational goals of the cardiac rehab program.   Knowledge Questionnaire Score:   Core Components/Risk Factors/Patient Goals at Admission:     Personal Goals and Risk Factors at Admission - 07/05/16 1011      Core Components/Risk Factors/Patient Goals on Admission    Weight Management Yes;Weight Maintenance;Weight Loss   Intervention Weight Management: Develop a combined nutrition and exercise program designed to reach desired caloric intake, while maintaining appropriate intake of nutrient and fiber, sodium and fats, and appropriate energy expenditure required for the weight goal.;Weight Management: Provide education and appropriate resources to help participant work on and attain dietary goals.;Weight Management/Obesity: Establish reasonable  short term and long term weight goals.   Expected Outcomes Short Term: Continue to assess and modify interventions until short term weight is achieved;Long Term: Adherence to nutrition and physical activity/exercise program aimed toward attainment of established weight goal;Weight Maintenance: Understanding of the daily nutrition guidelines, which includes 25-35% calories from fat, 7% or less cal from saturated fats, less than 200mg  cholesterol, less than 1.5gm of sodium, & 5 or more servings of fruits and vegetables daily;Weight Loss: Understanding of general recommendations for a balanced deficit meal plan,  which promotes 1-2 lb weight loss per week and includes a negative energy balance of [phone] kcal/d;Understanding of distribution of calorie intake throughout the day with the consumption of 4-5 meals/snacks;Understanding recommendations for meals to include 15-35% energy as protein, 25-35% energy from fat, 35-60% energy from carbohydrates, less than 200mg  of dietary cholesterol, 20-35 gm of total fiber daily   Stress Yes   Intervention Offer individual and/or small group education and counseling on adjustment to heart disease, stress management and health-related lifestyle change. Teach and support self-help strategies.;Refer participants experiencing significant psychosocial distress to appropriate mental health specialists for further evaluation and treatment. When possible, include family members and significant others in education/counseling sessions.   Expected Outcomes Short Term: Participant demonstrates changes in health-related behavior, relaxation and other stress management skills, ability to obtain effective social support, and compliance with psychotropic medications if prescribed.;Long Term: Emotional wellbeing is indicated by absence of clinically significant psychosocial distress or social isolation.   Personal Goal Other Yes   Personal Goal Learn risk factor modifications and improve  knowledge on CVD   Intervention Provide cardiac education classes geared towards reducing modifiable risk factors and improving awareness on CVD's   Expected Outcomes Pt will have increase understanding of CVD's and apply risk factor modifications to lufestyle to reduce future occurrences of CVD's.      Core Components/Risk Factors/Patient Goals Review:      Goals and Risk Factor Review    Row Name 07/16/16 2302             Core Components/Risk Factors/Patient Goals Review   Personal Goals Review Weight Management/Obesity;Lipids;Hypertension;Stress       Review Pt is off to a great start with the completion of 3 exercise sessions.   Continue to encourage attendance to education classess.       Expected Outcomes Pt will maintain healthy weight, lipids and bp within normal limits and positive and healthy coping skills for stress management.          Core Components/Risk Factors/Patient Goals at Discharge (Final Review):      Goals and Risk Factor Review - 07/16/16 2302      Core Components/Risk Factors/Patient Goals Review   Personal Goals Review Weight Management/Obesity;Lipids;Hypertension;Stress   Review Pt is off to a great start with the completion of 3 exercise sessions.   Continue to encourage attendance to education classess.   Expected Outcomes Pt will maintain healthy weight, lipids and bp within normal limits and positive and healthy coping skills for stress management.      ITP Comments:     ITP Comments    Row Name 07/05/16 0819           ITP Comments Medical Director, Dr. Armanda Magic          Comments: Pt is making expected progress toward personal goals after completing 3 sessions. Recommend continued exercise and life style modification education including  stress management and relaxation techniques to decrease cardiac risk profile. Psychosocial Assessment Pt wife had outpatient surgery on Friday and is recovering well. Pt exhibits positive coping  skills, hopeful outlook with supportive family.  No psychosocial needs identified at this time, no psychosocial interventions necessary. Alanson Aly, BSN Cardiac and Emergency planning/management officer

## 2016-07-18 ENCOUNTER — Encounter (HOSPITAL_COMMUNITY)
Admission: RE | Admit: 2016-07-18 | Discharge: 2016-07-18 | Disposition: A | Discharge status: Home / Self Care | Payer: BLUE CROSS/BLUE SHIELD | Source: Ambulatory Visit | Attending: Interventional Cardiology | Admitting: Interventional Cardiology

## 2016-07-18 ENCOUNTER — Encounter: Payer: Self-pay | Admitting: Interventional Cardiology

## 2016-07-18 ENCOUNTER — Encounter (HOSPITAL_COMMUNITY): Payer: BLUE CROSS/BLUE SHIELD

## 2016-07-18 DIAGNOSIS — Z955 Presence of coronary angioplasty implant and graft: Secondary | ICD-10-CM | POA: Diagnosis not present

## 2016-07-18 DIAGNOSIS — I2102 ST elevation (STEMI) myocardial infarction involving left anterior descending coronary artery: Secondary | ICD-10-CM

## 2016-07-20 ENCOUNTER — Encounter (HOSPITAL_COMMUNITY)
Admission: RE | Admit: 2016-07-20 | Discharge: 2016-07-20 | Disposition: A | Discharge status: Home / Self Care | Payer: BLUE CROSS/BLUE SHIELD | Source: Ambulatory Visit | Attending: Interventional Cardiology | Admitting: Interventional Cardiology

## 2016-07-20 DIAGNOSIS — Z955 Presence of coronary angioplasty implant and graft: Secondary | ICD-10-CM

## 2016-07-20 DIAGNOSIS — I2102 ST elevation (STEMI) myocardial infarction involving left anterior descending coronary artery: Secondary | ICD-10-CM

## 2016-07-23 ENCOUNTER — Encounter (HOSPITAL_COMMUNITY): Payer: BLUE CROSS/BLUE SHIELD

## 2016-07-23 ENCOUNTER — Encounter (HOSPITAL_COMMUNITY)
Admission: RE | Admit: 2016-07-23 | Discharge: 2016-07-23 | Disposition: A | Discharge status: Home / Self Care | Payer: BLUE CROSS/BLUE SHIELD | Source: Ambulatory Visit | Attending: Interventional Cardiology | Admitting: Interventional Cardiology

## 2016-07-23 DIAGNOSIS — Z955 Presence of coronary angioplasty implant and graft: Secondary | ICD-10-CM

## 2016-07-23 DIAGNOSIS — I2102 ST elevation (STEMI) myocardial infarction involving left anterior descending coronary artery: Secondary | ICD-10-CM

## 2016-07-25 ENCOUNTER — Encounter (HOSPITAL_COMMUNITY): Payer: BLUE CROSS/BLUE SHIELD

## 2016-07-27 ENCOUNTER — Encounter (HOSPITAL_COMMUNITY)
Admission: RE | Admit: 2016-07-27 | Discharge: 2016-07-27 | Disposition: A | Discharge status: Home / Self Care | Payer: BLUE CROSS/BLUE SHIELD | Source: Ambulatory Visit | Attending: Interventional Cardiology | Admitting: Interventional Cardiology

## 2016-07-27 DIAGNOSIS — Z955 Presence of coronary angioplasty implant and graft: Secondary | ICD-10-CM | POA: Diagnosis not present

## 2016-07-30 ENCOUNTER — Encounter (HOSPITAL_COMMUNITY)
Admission: RE | Admit: 2016-07-30 | Discharge: 2016-07-30 | Disposition: A | Discharge status: Home / Self Care | Payer: BLUE CROSS/BLUE SHIELD | Source: Ambulatory Visit | Attending: Interventional Cardiology | Admitting: Interventional Cardiology

## 2016-07-30 ENCOUNTER — Encounter (HOSPITAL_COMMUNITY): Payer: BLUE CROSS/BLUE SHIELD

## 2016-07-30 DIAGNOSIS — I2102 ST elevation (STEMI) myocardial infarction involving left anterior descending coronary artery: Secondary | ICD-10-CM

## 2016-07-30 DIAGNOSIS — Z955 Presence of coronary angioplasty implant and graft: Secondary | ICD-10-CM

## 2016-07-30 DIAGNOSIS — I200 Unstable angina: Secondary | ICD-10-CM | POA: Diagnosis not present

## 2016-08-02 NOTE — Progress Notes (Signed)
Cardiology Office Note    Date:  08/03/2016   ID:  CEASAR Fletcher, DOB 1955-10-13, MRN [id number]  PCP:  Asencion Gowda.August Saucer, MD  Cardiologist: Lesleigh Noe, MD   Chief Complaint  Patient presents with  . Coronary Artery Disease    History of Present Illness:  Tyler Fletcher is a 61 y.o. male with recent acute coronary syndrome, aborted LAD ST elevation myocardial infarction, and treated with drug-eluting stent during the acute episode 06/09/2016.  He has been asymptomatic since the acute coronary syndrome where he had total occlusion of the LAD for short time frame and presented after pain resolution and was found to have a 70-80% focal LAD stenosis with thrombus. 36 hours of anticoagulation followed by repeat cath and stenting of the LAD after he failed an exercise treadmill test prior to discharge. The symptoms since discharge from the hospital. No medication side effects.  Past Medical History:  Diagnosis Date  . Blood pressure elevated without history of HTN 06/09/2016  . CAD (coronary artery disease), native coronary artery    06/12/16 PCI w/ DES -->mLAD  . Hyperlipidemia     Past Surgical History:  Procedure Laterality Date  . CORONARY STENT INTERVENTION N/A 06/12/2016   Procedure: Coronary Stent Intervention;  Surgeon: Lyn Records, MD;  Location: [facility] INVASIVE CV LAB;  Service: Cardiovascular;  Laterality: N/A;  . LEFT HEART CATH AND CORONARY ANGIOGRAPHY N/A 06/09/2016   Procedure: Left Heart Cath and Coronary Angiography;  Surgeon: Lyn Records, MD;  Location: [facility] INVASIVE CV LAB;  Service: Cardiovascular;  Laterality: N/A;  . None      Current Medications: Outpatient Medications Prior to Visit  Medication Sig Dispense Refill  . aspirin 81 MG chewable tablet Chew 1 tablet (81 mg total) by mouth daily.    Marland Kitchen atorvastatin (LIPITOR) 80 MG tablet Take 1 tablet (80 mg total) by mouth daily at 6 PM. 90 tablet 3  . carvedilol (COREG) 12.5 MG tablet Take 1 tablet (12.5 mg  total) by mouth 2 (two) times daily with a meal. 180 tablet 3  . clopidogrel (PLAVIX) 75 MG tablet Take 1 tablet (75 mg total) by mouth daily with breakfast. 90 tablet 3  . irbesartan (AVAPRO) 75 MG tablet Take 0.5 tablets (37.5 mg total) by mouth daily. 45 tablet 3  . loratadine (CLARITIN) 10 MG tablet Take 10 mg by mouth daily.    . ranitidine (ZANTAC) 150 MG tablet Take 150 mg by mouth daily as needed for heartburn.     . tamsulosin (FLOMAX) 0.4 MG CAPS capsule Take 0.4 mg by mouth daily.    . nitroGLYCERIN (NITROSTAT) 0.4 MG SL tablet Place 1 tablet (0.4 mg total) under the tongue every 5 (five) minutes as needed. (Patient not taking: Reported on 08/03/2016) 25 tablet 3   No facility-administered medications prior to visit.      Allergies:   Patient has no known allergies.   Social History   Social History  . Marital status: Married    Spouse name: N/A  . Number of children: 2  . Years of education: N/A   Occupational History  . Unemployed    Social History Main Topics  . Smoking status: Never Smoker  . Smokeless tobacco: Never Used  . Alcohol use No  . Drug use: No  . Sexual activity: Not Asked   Other Topics Concern  . None   Social History Narrative  . None     Family History:  The  patient's family history includes Heart attack in his father; Heart disease in his father; Hypertension in his mother.   ROS:   Please see the history of present illness.    No complaints.  All other systems reviewed and are negative.   PHYSICAL EXAM:   VS:  BP 120/70 (BP Location: Left Arm)   Pulse 68   Ht 6\' 3"  (1.905 m)   Wt 220 lb (99.8 kg)   BMI 27.50 kg/m    GEN: Well nourished, well developed, in no acute distress  HEENT: normal  Neck: no JVD, carotid bruits, or masses Cardiac: RRR; no murmurs, rubs, or gallops,no edema  Respiratory:  clear to auscultation bilaterally, normal work of breathing GI: soft, nontender, nondistended, + BS MS: no deformity or atrophy  Skin:  warm and dry, no rash Neuro:  Alert and Oriented x 3, Strength and sensation are intact Psych: euthymic mood, full affect  Wt Readings from Last 3 Encounters:  08/03/16 220 lb (99.8 kg)  07/05/16 216 lb 4.3 oz (98.1 kg)  06/20/16 216 lb 1.9 oz (98 kg)      Studies/Labs Reviewed:   EKG:  EKG  Not repeated  Recent Labs: 06/09/2016: ALT 17 06/20/2016: BUN 14; Creatinine, Ser 1.19; Hemoglobin 15.2; Platelets 176; Potassium 4.4; Sodium 139   Lipid Panel    Component Value Date/Time   CHOL 155 06/09/2016 0930   TRIG 176 (H) 06/09/2016 0930   HDL 38 (L) 06/09/2016 0930   CHOLHDL 4.1 06/09/2016 0930   VLDL 35 06/09/2016 0930   LDLCALC 82 06/09/2016 0930    Additional studies/ records that were reviewed today include:  Coronary intervention May 15th 2018  Coronary Diagrams   Diagnostic Diagram       Post-Intervention Diagram          ASSESSMENT:    1. Coronary artery disease involving native coronary artery of native heart with angina pectoris (HCC)   2. Aborted myocardial infarction (HCC)   3. Hyperlipidemia LDL goal <70      PLAN:  In order of problems listed above:  1. Asymptomatic. Risk factor modification including aggressive lipid-lowering, diet, aerobic activity, blood pressure less than or equal to 130/85 mmHg. 2. No recurrence of symptoms. 3. LDL target less than 70. Fasting lipid panel will be obtained within the next several weeks along with a liver panel.  Clinical follow-up in 6 months. Liberalize physical activity. Call if symptoms.    Medication Adjustments/Labs and Tests Ordered: Current medicines are reviewed at length with the patient today.  Concerns regarding medicines are outlined above.  Medication changes, Labs and Tests ordered today are listed in the Patient Instructions below. There are no Patient Instructions on file for this visit.   Signed, Lesleigh NoeHenry W Kelin Borum III, MD  08/03/2016 8:41 AM    [facility] [address], [place], [place]  [address] Phone: [phone]; Fax: [phone]

## 2016-08-03 ENCOUNTER — Ambulatory Visit (INDEPENDENT_AMBULATORY_CARE_PROVIDER_SITE_OTHER): Payer: BLUE CROSS/BLUE SHIELD | Admitting: Interventional Cardiology

## 2016-08-03 ENCOUNTER — Telehealth: Payer: Self-pay | Admitting: Interventional Cardiology

## 2016-08-03 ENCOUNTER — Encounter: Payer: Self-pay | Admitting: Interventional Cardiology

## 2016-08-03 ENCOUNTER — Encounter (HOSPITAL_COMMUNITY)
Admission: RE | Admit: 2016-08-03 | Discharge: 2016-08-03 | Disposition: A | Discharge status: Home / Self Care | Payer: BLUE CROSS/BLUE SHIELD | Source: Ambulatory Visit | Attending: Interventional Cardiology | Admitting: Interventional Cardiology

## 2016-08-03 VITALS — BP 120/70 | HR 68 | Ht 75.0 in | Wt 220.0 lb

## 2016-08-03 DIAGNOSIS — Z955 Presence of coronary angioplasty implant and graft: Secondary | ICD-10-CM

## 2016-08-03 DIAGNOSIS — I200 Unstable angina: Secondary | ICD-10-CM

## 2016-08-03 DIAGNOSIS — E785 Hyperlipidemia, unspecified: Secondary | ICD-10-CM

## 2016-08-03 DIAGNOSIS — I2102 ST elevation (STEMI) myocardial infarction involving left anterior descending coronary artery: Secondary | ICD-10-CM

## 2016-08-03 DIAGNOSIS — I25119 Atherosclerotic heart disease of native coronary artery with unspecified angina pectoris: Secondary | ICD-10-CM

## 2016-08-03 NOTE — Telephone Encounter (Signed)
New message    Pt is calling to ask a question he forgot to ask. He said he had heart attack in May and was going to file for disability, he is asking if Dr. Katrinka BlazingSmith would sign off on it.

## 2016-08-03 NOTE — Patient Instructions (Signed)
Medication Instructions:  None  Labwork: Your physician recommends that you return for lab work when you are fasting (Lipid, liver)    Testing/Procedures: None  Follow-Up: Your physician wants you to follow-up in: 6 months with Dr. Katrinka BlazingSmith.  You will receive a reminder letter in the mail two months in advance. If you don't receive a letter, please call our office to schedule the follow-up appointment.   Any Other Special Instructions Will Be Listed Below (If Applicable).     If you need a refill on your cardiac medications before your next appointment, please call your pharmacy.

## 2016-08-03 NOTE — Telephone Encounter (Signed)
Spoke with Dr. Katrinka BlazingSmith and he said pt could work.  Spoke with pt and he wanted to know if Dr.Smith would fill out full disability papers for him.  Advised pt that Dr. Katrinka BlazingSmith said he could work.  Advised if work required any paperwork for anything to contact our medical records dept so they could get this started for him.  Pt appreciative for call.

## 2016-08-06 ENCOUNTER — Encounter (HOSPITAL_COMMUNITY): Payer: BLUE CROSS/BLUE SHIELD

## 2016-08-06 ENCOUNTER — Encounter (HOSPITAL_COMMUNITY)
Admission: RE | Admit: 2016-08-06 | Discharge: 2016-08-06 | Disposition: A | Discharge status: Home / Self Care | Payer: BLUE CROSS/BLUE SHIELD | Source: Ambulatory Visit | Attending: Interventional Cardiology | Admitting: Interventional Cardiology

## 2016-08-06 DIAGNOSIS — Z955 Presence of coronary angioplasty implant and graft: Secondary | ICD-10-CM

## 2016-08-06 DIAGNOSIS — I2102 ST elevation (STEMI) myocardial infarction involving left anterior descending coronary artery: Secondary | ICD-10-CM

## 2016-08-08 ENCOUNTER — Encounter (HOSPITAL_COMMUNITY): Payer: BLUE CROSS/BLUE SHIELD

## 2016-08-12 NOTE — Progress Notes (Signed)
Cardiac Individual Treatment Plan  Patient Details  Name: Tyler Fletcher MRN: [id number] Date of Birth: 24-Nov-1955 Referring Provider:     CARDIAC REHAB PHASE II ORIENTATION from 07/05/2016 in [place]  Referring Provider  Daneen Schick MD      Initial Encounter Date:    CARDIAC REHAB PHASE II ORIENTATION from 07/05/2016 in [place]  Date  07/05/16  Referring Provider  Daneen Schick MD      Visit Diagnosis: 06/12/16 Status post coronary artery stent placement  06/09/16 ST elevation myocardial infarction involving left anterior descending (LAD) coronary artery ([place])  Patient's Home Medications on Admission:  Current Outpatient Prescriptions:  .  aspirin 81 MG chewable tablet, Chew 1 tablet (81 mg total) by mouth daily., Disp: , Rfl:  .  atorvastatin (LIPITOR) 80 MG tablet, Take 1 tablet (80 mg total) by mouth daily at 6 PM., Disp: 90 tablet, Rfl: 3 .  carvedilol (COREG) 12.5 MG tablet, Take 1 tablet (12.5 mg total) by mouth 2 (two) times daily with a meal., Disp: 180 tablet, Rfl: 3 .  clopidogrel (PLAVIX) 75 MG tablet, Take 1 tablet (75 mg total) by mouth daily with breakfast., Disp: 90 tablet, Rfl: 3 .  irbesartan (AVAPRO) 75 MG tablet, Take 0.5 tablets (37.5 mg total) by mouth daily., Disp: 45 tablet, Rfl: 3 .  loratadine (CLARITIN) 10 MG tablet, Take 10 mg by mouth daily., Disp: , Rfl:  .  nitroGLYCERIN (NITROSTAT) 0.4 MG SL tablet, Place 0.4 mg under the tongue every 5 (five) minutes as needed for chest pain., Disp: , Rfl:  .  ranitidine (ZANTAC) 150 MG tablet, Take 150 mg by mouth daily as needed for heartburn. , Disp: , Rfl:  .  tamsulosin (FLOMAX) 0.4 MG CAPS capsule, Take 0.4 mg by mouth daily., Disp: , Rfl:   Past Medical History: Past Medical History:  Diagnosis Date  . Blood pressure elevated without history of HTN 06/09/2016  . CAD (coronary artery disease), native coronary artery    06/12/16 PCI w/ DES  -->mLAD  . Hyperlipidemia     Tobacco Use: History  Smoking Status  . Never Smoker  Smokeless Tobacco  . Never Used    Labs: Recent Review Flowsheet Data    Labs for ITP Cardiac and Pulmonary Rehab Latest Ref Rng & Units 06/09/2016 06/10/2016   Cholestrol 0 - 200 mg/dL 155 -   LDLCALC 0 - 99 mg/dL 82 -   HDL >40 mg/dL 38(L) -   Trlycerides <150 mg/dL 176(H) -   Hemoglobin A1c 4.8 - 5.6 % 5.4 5.3   TCO2 0 - 100 mmol/L 23 -      Capillary Blood Glucose: No results found for: GLUCAP   Exercise Target Goals:    Exercise Program Goal: Individual exercise prescription set with THRR, safety & activity barriers. Participant demonstrates ability to understand and report RPE using BORG scale, to self-measure pulse accurately, and to acknowledge the importance of the exercise prescription.  Exercise Prescription Goal: Starting with aerobic activity 30 plus minutes a day, 3 days per week for initial exercise prescription. Provide home exercise prescription and guidelines that participant acknowledges understanding prior to discharge.  Activity Barriers & Risk Stratification:     Activity Barriers & Cardiac Risk Stratification - 07/05/16 0824      Activity Barriers & Cardiac Risk Stratification   Activity Barriers Arthritis;Other (comment);Back Problems   Comments arthritis in L knee, lower back and R RTC  Cardiac Risk Stratification High      6 Minute Walk:     6 Minute Walk    Row Name 07/05/16 0921 07/05/16 1010       6 Minute Walk   Phase Initial  -    Distance 1767 feet  -    Walk Time 6 minutes  -    # of Rest Breaks 0  -    MPH 3.35  -    METS 4.1  -    RPE 9  -    VO2 Peak 14.4  -    Symptoms No  -    Resting HR 68 bpm  -    Resting BP 132/88  -    Max Ex. HR 87 bpm  -    Max Ex. BP 130/86  -    2 Minute Post BP  - 110/76       Oxygen Initial Assessment:   Oxygen Re-Evaluation:   Oxygen Discharge (Final Oxygen Re-Evaluation):   Initial  Exercise Prescription:     Initial Exercise Prescription - 07/05/16 0900      Date of Initial Exercise RX and Referring Provider   Date 07/05/16   Referring Provider Daneen Schick MD     Treadmill   MPH 3.2   Grade 1   Minutes 10   METs 3.89     Bike   Level 1.2   Minutes 10   METs 3.26     NuStep   Level 3   SPM 80   Minutes 10   METs 2.5     Prescription Details   Frequency (times per week) 3   Duration Progress to 30 minutes of continuous aerobic without signs/symptoms of physical distress     Intensity   THRR 40-80% of Max Heartrate 63-126   Ratings of Perceived Exertion 11-13   Perceived Dyspnea 0-4     Progression   Progression Continue to progress workloads to maintain intensity without signs/symptoms of physical distress.     Resistance Training   Training Prescription Yes   Weight 5lbs   Reps 10-15      Perform Capillary Blood Glucose checks as needed.  Exercise Prescription Changes:      Exercise Prescription Changes    Row Name 07/17/16 1200 07/27/16 1522 08/13/16 1500         Response to Exercise   Blood Pressure (Admit) 120/64 108/78 128/70     Blood Pressure (Exercise) 134/72 140/74 140/80     Blood Pressure (Exit) 112/80 124/62 108/80     Heart Rate (Admit) 78 bpm 74 bpm 63 bpm     Heart Rate (Exercise) 106 bpm 106 bpm 95 bpm     Heart Rate (Exit) 78 bpm 74 bpm 60 bpm     Rating of Perceived Exertion (Exercise) '11 11 11     ' Symptoms none none none     Comments Pt was oriented to exercise equipment and responded well to exercise  - -     Duration Continue with 30 min of aerobic exercise without signs/symptoms of physical distress. Continue with 30 min of aerobic exercise without signs/symptoms of physical distress. Continue with 30 min of aerobic exercise without signs/symptoms of physical distress.     Intensity THRR unchanged THRR unchanged THRR unchanged       Progression   Progression Continue to progress workloads to maintain  intensity without signs/symptoms of physical distress. Continue to progress workloads to maintain intensity without signs/symptoms of  physical distress. Continue to progress workloads to maintain intensity without signs/symptoms of physical distress.     Average METs 3.2 3.5 3.6       Resistance Training   Training Prescription Yes Yes Yes     Weight 4lbs 5lbs 5lbs     Reps 10-15 10-15 10-15     Time 10 Minutes 10 Minutes 10 Minutes       Treadmill   MPH 3.2 3.2 3.2     Grade '1 1 1     ' Minutes '10 10 10     ' METs 3.89 3.89 3.89       Bike   Level 1.2 1.2 1.2     Minutes '10 10 10     ' METs 3.26 3.31 3.26       NuStep   Level '3 5 5     ' SPM 80 90 90     Minutes '10 10 10     ' METs 2.5 3.2 3.6        Exercise Comments:      Exercise Comments    Row Name 07/17/16 1226 08/13/16 1526         Exercise Comments Pt is responding well to exercise in cardiac rehab. Will continue to f/u with pt's activty levels and exercise progression Reviewed METs and goals. Pt is tolerating exercise very well; will continue to monitor pt's progress.         Exercise Goals and Review:      Exercise Goals    Row Name 07/05/16 0825             Exercise Goals   Increase Physical Activity Yes       Intervention Provide advice, education, support and counseling about physical activity/exercise needs.;Develop an individualized exercise prescription for aerobic and resistive training based on initial evaluation findings, risk stratification, comorbidities and participant's personal goals.       Expected Outcomes Achievement of increased cardiorespiratory fitness and enhanced flexibility, muscular endurance and strength shown through measurements of functional capacity and personal statement of participant.       Increase Strength and Stamina Yes  learn exercise limitations and learn risk factors       Intervention Provide advice, education, support and counseling about physical activity/exercise  needs.;Develop an individualized exercise prescription for aerobic and resistive training based on initial evaluation findings, risk stratification, comorbidities and participant's personal goals.       Expected Outcomes Achievement of increased cardiorespiratory fitness and enhanced flexibility, muscular endurance and strength shown through measurements of functional capacity and personal statement of participant.          Exercise Goals Re-Evaluation :     Exercise Goals Re-Evaluation    Row Name 08/13/16 1525 08/13/16 1526           Exercise Goal Re-Evaluation   Exercise Goals Review Increase Physical Activity;Increase Strenth and Stamina  -      Comments Pt is tolerating exercise very well. Pt MET level on stepper has increased from  Pt is tolerating exercise very well. Pt MET level on stepper has increased from 2.5 to 3.6      Expected Outcomes  - Pt will continue to improve in cardiorespiratory fitness.          Discharge Exercise Prescription (Final Exercise Prescription Changes):     Exercise Prescription Changes - 08/13/16 1500      Response to Exercise   Blood Pressure (Admit) 128/70   Blood Pressure (Exercise) 140/80  Blood Pressure (Exit) 108/80   Heart Rate (Admit) 63 bpm   Heart Rate (Exercise) 95 bpm   Heart Rate (Exit) 60 bpm   Rating of Perceived Exertion (Exercise) 11   Symptoms none   Comments --   Duration Continue with 30 min of aerobic exercise without signs/symptoms of physical distress.   Intensity THRR unchanged     Progression   Progression Continue to progress workloads to maintain intensity without signs/symptoms of physical distress.   Average METs 3.6     Resistance Training   Training Prescription Yes   Weight 5lbs   Reps 10-15   Time 10 Minutes     Treadmill   MPH 3.2   Grade 1   Minutes 10   METs 3.89     Bike   Level 1.2   Minutes 10   METs 3.26     NuStep   Level 5   SPM 90   Minutes 10   METs 3.6      Nutrition:   Target Goals: Understanding of nutrition guidelines, daily intake of sodium <1572m, cholesterol <2024m calories 30% from fat and 7% or less from saturated fats, daily to have 5 or more servings of fruits and vegetables.  Biometrics:     Pre Biometrics - 07/05/16 097591    Pre Biometrics   Height '6\' 2"'  (1.88 m)   Weight 216 lb 4.3 oz (98.1 kg)   Waist Circumference 43 inches   Hip Circumference 42.5 inches   Waist to Hip Ratio 1.01 %   BMI (Calculated) 27.8   Triceps Skinfold 20 mm   % Body Fat 29.4 %   Grip Strength 35 kg   Flexibility 13 in   Single Leg Stand 30 seconds       Nutrition Therapy Plan and Nutrition Goals:     Nutrition Therapy & Goals - 08/13/16 1215      Nutrition Therapy   Diet Therapeutic Lifestyle Changes     Personal Nutrition Goals   Nutrition Goal Wt loss of 1-2 lb/week to a wt loss goal of 5 lb [place] regarding individualized specific dietary modifications aiming towards targeted core components such as weight, hypertension, lipid management, diabetes, heart failure and other comorbidities.   Expected Outcomes Short Term Goal: Understand basic principles of dietary content, such as calories, fat, sodium, cholesterol and nutrients.;Long Term Goal: Adherence to prescribed nutrition plan.      Nutrition Discharge: Nutrition Scores:     Nutrition Assessments - 08/13/16 1215      MEDFICTS Scores   Pre Score --  Pt to return Wed 08/15/16      Nutrition Goals Re-Evaluation:   Nutrition Goals Re-Evaluation:   Nutrition Goals Discharge (Final Nutrition Goals Re-Evaluation):   Psychosocial: Target Goals: Acknowledge presence or absence of significant depression and/or stress, maximize coping skills, provide positive support system. Participant is able to verbalize types and ability to use techniques and skills needed for reducing stress and  depression.  Initial Review & Psychosocial Screening:     Initial Psych Review & Screening - 07/05/16 1204      Initial Review   Current issues with Current Stress Concerns   Source of Stress Concerns Family   Comments Pt has 1411 o son; Father paseed away on the day of pt cardiac event/[place]     [place]  Concerns Recent loss of significant other   Comments father passed away - lived in [facility]     Barriers   Psychosocial barriers to participate in program The patient should benefit from training in stress management and relaxation.      Quality of Life Scores:   PHQ-9: Recent Review Flowsheet Data    Depression screen [facility] 2/9 07/11/2016   Decreased Interest 0   Down, Depressed, Hopeless 0   PHQ - 2 Score 0     Interpretation of Total Score  Total Score Depression Severity:  1-4 = Minimal depression, 5-9 = Mild depression, 10-14 = Moderate depression, 15-19 = Moderately severe depression, 20-27 = Severe depression   Psychosocial Evaluation and Intervention:     Psychosocial Evaluation - 08/12/16 2104      Psychosocial Evaluation & Interventions   Interventions Stress management education;Relaxation education   Comments Pt with supportive family, feels good about life.   Continue Psychosocial Services  Follow up required by staff      Psychosocial Re-Evaluation:     Psychosocial Re-Evaluation    Row Name 08/12/16 2105             Psychosocial Re-Evaluation   Current issues with None Identified       Interventions Stress management education;Encouraged to attend Cardiac Rehabilitation for the exercise;Relaxation education       Continue Psychosocial Services  Follow up required by staff          Psychosocial Discharge (Final Psychosocial Re-Evaluation):     Psychosocial Re-Evaluation - 08/12/16 2105      Psychosocial Re-Evaluation   Current issues with None Identified   Interventions Stress management  education;Encouraged to attend Cardiac Rehabilitation for the exercise;Relaxation education   Continue Psychosocial Services  Follow up required by staff      Vocational Rehabilitation: Provide vocational rehab assistance to qualifying candidates.   Vocational Rehab Evaluation & Intervention:   Education: Education Goals: Education classes will be provided on a weekly basis, covering required topics. Participant will state understanding/return demonstration of topics presented.  Learning Barriers/Preferences:     Learning Barriers/Preferences - 07/05/16 7672      Learning Barriers/Preferences   Learning Barriers Sight   Learning Preferences Skilled Demonstration      Education Topics: Count Your Pulse:  -Group instruction provided by verbal instruction, demonstration, patient participation and written materials to support subject.  Instructors address importance of being able to find your pulse and how to count your pulse when at home without a heart monitor.  Patients get hands on experience counting their pulse with staff help and individually.   CARDIAC REHAB PHASE II EXERCISE from 08/03/2016 in [place]  Date  08/03/16  Instruction Review Code  2- meets goals/outcomes      Heart Attack, Angina, and Risk Factor Modification:  -Group instruction provided by verbal instruction, video, and written materials to support subject.  Instructors address signs and symptoms of angina and heart attacks.    Also discuss risk factors for heart disease and how to make changes to improve heart health risk factors.   Functional Fitness:  -Group instruction provided by verbal instruction, demonstration, patient participation, and written materials to support subject.  Instructors address safety measures for doing things around the house.  Discuss how to get up and down off the floor, how to pick things up properly, how to safely get out of a chair without  assistance, and balance training.   Meditation and Mindfulness:  -  Group instruction provided by verbal instruction, patient participation, and written materials to support subject.  Instructor addresses importance of mindfulness and meditation practice to help reduce stress and improve awareness.  Instructor also leads participants through a meditation exercise.    Stretching for Flexibility and Mobility:  -Group instruction provided by verbal instruction, patient participation, and written materials to support subject.  Instructors lead participants through series of stretches that are designed to increase flexibility thus improving mobility.  These stretches are additional exercise for major muscle groups that are typically performed during regular warm up and cool down.   CARDIAC REHAB PHASE II EXERCISE from 08/03/2016 in [place]  Date  07/20/16  Instruction Review Code  2- meets goals/outcomes      Hands Only CPR:  -Group verbal, video, and participation provides a basic overview of AHA guidelines for community CPR. Role-play of emergencies allow participants the opportunity to practice calling for help and chest compression technique with discussion of AED use.   Hypertension: -Group verbal and written instruction that provides a basic overview of hypertension including the most recent diagnostic guidelines, risk factor reduction with self-care instructions and medication management.    Nutrition I class: Heart Healthy Eating:  -Group instruction provided by PowerPoint slides, verbal discussion, and written materials to support subject matter. The instructor gives an explanation and review of the Therapeutic Lifestyle Changes diet recommendations, which includes a discussion on lipid goals, dietary fat, sodium, fiber, plant stanol/sterol esters, sugar, and the components of a well-balanced, healthy diet.   Nutrition II class: Lifestyle Skills:   -Group instruction provided by PowerPoint slides, verbal discussion, and written materials to support subject matter. The instructor gives an explanation and review of label reading, grocery shopping for heart health, heart healthy recipe modifications, and ways to make healthier choices when eating out.   Diabetes Question & Answer:  -Group instruction provided by PowerPoint slides, verbal discussion, and written materials to support subject matter. The instructor gives an explanation and review of diabetes co-morbidities, pre- and post-prandial blood glucose goals, pre-exercise blood glucose goals, signs, symptoms, and treatment of hypoglycemia and hyperglycemia, and foot care basics.   Diabetes Blitz:  -Group instruction provided by PowerPoint slides, verbal discussion, and written materials to support subject matter. The instructor gives an explanation and review of the physiology behind type 1 and type 2 diabetes, diabetes medications and rational behind using different medications, pre- and post-prandial blood glucose recommendations and Hemoglobin A1c goals, diabetes diet, and exercise including blood glucose guidelines for exercising safely.    Portion Distortion:  -Group instruction provided by PowerPoint slides, verbal discussion, written materials, and food models to support subject matter. The instructor gives an explanation of serving size versus portion size, changes in portions sizes over the last 20 years, and what consists of a serving from each food group.   CARDIAC REHAB PHASE II EXERCISE from 08/03/2016 in [place]  Date  07/18/16  Educator  RD  Instruction Review Code  2- meets goals/outcomes      Stress Management:  -Group instruction provided by verbal instruction, video, and written materials to support subject matter.  Instructors review role of stress in heart disease and how to cope with stress positively.     Exercising on Your  Own:  -Group instruction provided by verbal instruction, power point, and written materials to support subject.  Instructors discuss benefits of exercise, components of exercise, frequency and intensity of exercise, and end  points for exercise.  Also discuss use of nitroglycerin and activating EMS.  Review options of places to exercise outside of rehab.  Review guidelines for sex with heart disease.   Cardiac Drugs I:  -Group instruction provided by verbal instruction and written materials to support subject.  Instructor reviews cardiac drug classes: antiplatelets, anticoagulants, beta blockers, and statins.  Instructor discusses reasons, side effects, and lifestyle considerations for each drug class.   Cardiac Drugs II:  -Group instruction provided by verbal instruction and written materials to support subject.  Instructor reviews cardiac drug classes: angiotensin converting enzyme inhibitors (ACE-I), angiotensin II receptor blockers (ARBs), nitrates, and calcium channel blockers.  Instructor discusses reasons, side effects, and lifestyle considerations for each drug class.   CARDIAC REHAB PHASE II EXERCISE from 08/03/2016 in [place]  Date  07/11/16  Instruction Review Code  2- meets goals/outcomes      Anatomy and Physiology of the Circulatory System:  Group verbal and written instruction and models provide basic cardiac anatomy and physiology, with the coronary electrical and arterial systems. Review of: AMI, Angina, Valve disease, Heart Failure, Peripheral Artery Disease, Cardiac Arrhythmia, Pacemakers, and the ICD.   Other Education:  -Group or individual verbal, written, or video instructions that support the educational goals of the cardiac rehab program.   Knowledge Questionnaire Score:   Core Components/Risk Factors/Patient Goals at Admission:     Personal Goals and Risk Factors at Admission - 07/05/16 1011      Core Components/Risk  Factors/Patient Goals on Admission    Weight Management Yes;Weight Maintenance;Weight Loss   Intervention Weight Management: Develop a combined nutrition and exercise program designed to reach desired caloric intake, while maintaining appropriate intake of nutrient and fiber, sodium and fats, and appropriate energy expenditure required for the weight goal.;Weight Management: Provide education and appropriate resources to help participant work on and attain dietary goals.;Weight Management/Obesity: Establish reasonable short term and long term weight goals.   Expected Outcomes Short Term: Continue to assess and modify interventions until short term weight is achieved;Long Term: Adherence to nutrition and physical activity/exercise program aimed toward attainment of established weight goal;Weight Maintenance: Understanding of the daily nutrition guidelines, which includes 25-35% calories from fat, 7% or less cal from saturated fats, less than 255m cholesterol, less than 1.5gm of sodium, & 5 or more servings of fruits and vegetables daily;Weight Loss: Understanding of general recommendations for a balanced deficit meal plan, which promotes 1-2 lb weight loss per week and includes a negative energy balance of [phone] kcal/d;Understanding of distribution of calorie intake throughout the day with the consumption of 4-5 meals/snacks;Understanding recommendations for meals to include 15-35% energy as protein, 25-35% energy from fat, 35-60% energy from carbohydrates, less than 2052mof dietary cholesterol, 20-35 gm of total fiber daily   Stress Yes   Intervention Offer individual and/or small group education and counseling on adjustment to heart disease, stress management and health-related lifestyle change. Teach and support self-help strategies.;Refer participants experiencing significant psychosocial distress to appropriate mental health specialists for further evaluation and treatment. When possible, include  family members and significant others in education/counseling sessions.   Expected Outcomes Short Term: Participant demonstrates changes in health-related behavior, relaxation and other stress management skills, ability to obtain effective social support, and compliance with psychotropic medications if prescribed.;Long Term: Emotional wellbeing is indicated by absence of clinically significant psychosocial distress or social isolation.   Personal Goal Other Yes   Personal Goal Learn risk factor modifications and improve knowledge  on CVD   Intervention Provide cardiac education classes geared towards reducing modifiable risk factors and improving awareness on CVD's   Expected Outcomes Pt will have increase understanding of CVD's and apply risk factor modifications to lufestyle to reduce future occurrences of CVD's.      Core Components/Risk Factors/Patient Goals Review:      Goals and Risk Factor Review    Row Name 07/16/16 2302 08/12/16 2103           Core Components/Risk Factors/Patient Goals Review   Personal Goals Review Weight Management/Obesity;Lipids;Hypertension;Stress Weight Management/Obesity;Lipids;Hypertension;Stress      Review Pt is off to a great start with the completion of 3 exercise sessions.   Continue to encourage attendance to education classess.  Continue to encourage attendance to education classess.      Expected Outcomes Pt will maintain healthy weight, lipids and bp within normal limits and positive and healthy coping skills for stress management. Pt will maintain healthy weight, lipids and bp within normal limits and positive and healthy coping skills for stress management.         Core Components/Risk Factors/Patient Goals at Discharge (Final Review):      Goals and Risk Factor Review - 08/12/16 2103      Core Components/Risk Factors/Patient Goals Review   Personal Goals Review Weight Management/Obesity;Lipids;Hypertension;Stress   Review  Continue to  encourage attendance to education classess.   Expected Outcomes Pt will maintain healthy weight, lipids and bp within normal limits and positive and healthy coping skills for stress management.      ITP Comments:     ITP Comments    Row Name 07/05/16 0819           ITP Comments Medical Director, Dr. Fransico Him          Comments:  Aurelio is making expected progress toward personal goals after completing 9 sessions. [place]. Psychosocial Assessment Pt exhibits positive coping skills, hopeful outlook with supportive family. No psychosocial needs identified at this time, no psychosocial interventions necessary.  Recommend continued exercise and life style modification education including  stress management and relaxation techniques to decrease cardiac risk profile. Cherre Huger, BSN Cardiac and Training and development officer

## 2016-08-13 ENCOUNTER — Encounter (HOSPITAL_COMMUNITY)
Admission: RE | Admit: 2016-08-13 | Discharge: 2016-08-13 | Disposition: A | Discharge status: Home / Self Care | Payer: BLUE CROSS/BLUE SHIELD | Source: Ambulatory Visit | Attending: Interventional Cardiology | Admitting: Interventional Cardiology

## 2016-08-13 ENCOUNTER — Encounter (HOSPITAL_COMMUNITY): Payer: BLUE CROSS/BLUE SHIELD

## 2016-08-13 DIAGNOSIS — Z955 Presence of coronary angioplasty implant and graft: Secondary | ICD-10-CM | POA: Diagnosis not present

## 2016-08-13 DIAGNOSIS — I2102 ST elevation (STEMI) myocardial infarction involving left anterior descending coronary artery: Secondary | ICD-10-CM

## 2016-08-13 NOTE — Progress Notes (Signed)
Tyler Fletcher 61 y.o. male       Nutrition Screen & Note  1. 06/09/16 ST elevation myocardial infarction involving left anterior descending (LAD) coronary artery (HCC)   2. 06/12/16 Status post coronary artery stent placement    Past Medical History:  Diagnosis Date  . Blood pressure elevated without history of HTN 06/09/2016  . CAD (coronary artery disease), native coronary artery    06/12/16 PCI w/ DES -->mLAD  . Hyperlipidemia    Meds reviewed.  HT: Ht Readings from Last 1 Encounters:  08/03/16 6\' 3"  (1.905 m)    WT: Wt Readings from Last 3 Encounters:  08/03/16 220 lb (99.8 kg)  07/05/16 216 lb 4.3 oz (98.1 kg)  06/20/16 216 lb 1.9 oz (98 kg)     BMI 27.6   Current tobacco use? No  Labs:  Lipid Panel     Component Value Date/Time   CHOL 155 06/09/2016 0930   TRIG 176 (H) 06/09/2016 0930   HDL 38 (L) 06/09/2016 0930   CHOLHDL 4.1 06/09/2016 0930   VLDL 35 06/09/2016 0930   LDLCALC 82 06/09/2016 0930    Lab Results  Component Value Date   HGBA1C 5.3 06/10/2016   CBG (last 3)  No results for input(s): GLUCAP in the last 72 hours.  Nutrition Note Spoke with pt. Pt has not returned his nutrition survey at this time. Pt reports he completed the survey and left it at home. Pt states he has changed his diet "some" since his MI. Pt is baking and grilling more. Pt went on vacation last week and ate "a lot of fried food."  Pt expressed understanding of the information reviewed. Pt aware of nutrition education classes offered.  Nutrition Diagnosis ? Food-and nutrition-related knowledge deficit related to lack of exposure to information as related to diagnosis of: ? CVD ? Overweight related to excessive energy intake as evidenced by a BMI of 27.6  Nutrition Intervention ? Pt's individual nutrition plan and goals reviewed with pt. ? Pt given handouts for: ? Nutrition I class ? Nutrition II class   Nutrition Goal(s):  ? Pt to identify and limit food sources of  saturated fat, trans fat, and sodium ? Pt to identify food quantities necessary to achieve weight loss of 5 lb at graduation from cardiac rehab.   Plan:  Pt to attend nutrition classes ? Nutrition I ? Nutrition II ? Portion Distortion  Will provide client-centered nutrition education as part of interdisciplinary care.   Monitor and evaluate progress toward nutrition goal with team.  Tyler PlumbEdna Hanad Fletcher, M.Ed, RD, LDN, CDE 08/13/2016 12:17 PM

## 2016-08-15 ENCOUNTER — Encounter (HOSPITAL_COMMUNITY): Payer: BLUE CROSS/BLUE SHIELD

## 2016-08-15 ENCOUNTER — Telehealth (HOSPITAL_COMMUNITY): Payer: Self-pay | Admitting: Family Medicine

## 2016-08-17 ENCOUNTER — Encounter (HOSPITAL_COMMUNITY)
Admission: RE | Admit: 2016-08-17 | Discharge: 2016-08-17 | Disposition: A | Discharge status: Home / Self Care | Payer: BLUE CROSS/BLUE SHIELD | Source: Ambulatory Visit | Attending: Interventional Cardiology | Admitting: Interventional Cardiology

## 2016-08-17 ENCOUNTER — Other Ambulatory Visit: Payer: BLUE CROSS/BLUE SHIELD | Admitting: *Deleted

## 2016-08-17 DIAGNOSIS — I2102 ST elevation (STEMI) myocardial infarction involving left anterior descending coronary artery: Secondary | ICD-10-CM

## 2016-08-17 DIAGNOSIS — I25119 Atherosclerotic heart disease of native coronary artery with unspecified angina pectoris: Secondary | ICD-10-CM

## 2016-08-17 DIAGNOSIS — Z955 Presence of coronary angioplasty implant and graft: Secondary | ICD-10-CM

## 2016-08-17 DIAGNOSIS — E785 Hyperlipidemia, unspecified: Secondary | ICD-10-CM

## 2016-08-17 LAB — LIPID PANEL
CHOL/HDL RATIO: 2.2 ratio (ref 0.0–5.0)
CHOLESTEROL TOTAL: 85 mg/dL — AB (ref 100–199)
HDL: 39 mg/dL — ABNORMAL LOW (ref >39)
LDL Calculated: 21 mg/dL (ref 0–99)
TRIGLYCERIDES: 127 mg/dL (ref 0–149)
VLDL Cholesterol Cal: 25 mg/dL (ref 5–40)

## 2016-08-17 LAB — HEPATIC FUNCTION PANEL
ALT: 21 IU/L (ref 0–44)
AST: 19 IU/L (ref 0–40)
Albumin: 4.6 g/dL (ref 3.6–4.8)
Alkaline Phosphatase: 65 IU/L (ref 39–117)
BILIRUBIN TOTAL: 1.7 mg/dL — AB (ref 0.0–1.2)
BILIRUBIN, DIRECT: 0.47 mg/dL — AB (ref 0.00–0.40)
TOTAL PROTEIN: 6.4 g/dL (ref 6.0–8.5)

## 2016-08-20 ENCOUNTER — Encounter (HOSPITAL_COMMUNITY): Payer: BLUE CROSS/BLUE SHIELD

## 2016-08-22 ENCOUNTER — Encounter (HOSPITAL_COMMUNITY): Payer: BLUE CROSS/BLUE SHIELD

## 2016-08-22 ENCOUNTER — Encounter (HOSPITAL_COMMUNITY)
Admission: RE | Admit: 2016-08-22 | Discharge: 2016-08-22 | Disposition: A | Discharge status: Home / Self Care | Payer: BLUE CROSS/BLUE SHIELD | Source: Ambulatory Visit | Attending: Interventional Cardiology | Admitting: Interventional Cardiology

## 2016-08-22 ENCOUNTER — Telehealth: Payer: Self-pay | Admitting: *Deleted

## 2016-08-22 DIAGNOSIS — Z955 Presence of coronary angioplasty implant and graft: Secondary | ICD-10-CM | POA: Diagnosis not present

## 2016-08-22 DIAGNOSIS — I2102 ST elevation (STEMI) myocardial infarction involving left anterior descending coronary artery: Secondary | ICD-10-CM

## 2016-08-22 DIAGNOSIS — E785 Hyperlipidemia, unspecified: Secondary | ICD-10-CM

## 2016-08-22 MED ORDER — ATORVASTATIN CALCIUM 20 MG PO TABS: 20.0000 mg | ORAL_TABLET | Freq: Every day | ORAL | 3 refills | Status: DC

## 2016-08-22 NOTE — Telephone Encounter (Signed)
Spoke with pt and made him aware of results and recommendations per Dr. Katrinka BlazingSmith.  Pt verbalized understanding and was in agreement with this plan.  Sent prescription to preferred pharmacy.  Pt will have labs 10/17/16.

## 2016-08-22 NOTE — Telephone Encounter (Signed)
-----   Message from Lyn RecordsHenry W Smith, MD sent at 08/20/2016 12:43 PM EDT ----- Let the patient know decrease atorvastatin to 20 mg per day. Liver and lipid panel 8 week later. A copy will be [facility] to [facility] Inc[facility]chell, L.August Saucerean, MD

## 2016-08-27 ENCOUNTER — Encounter (HOSPITAL_COMMUNITY)
Admission: RE | Admit: 2016-08-27 | Discharge: 2016-08-27 | Disposition: A | Discharge status: Home / Self Care | Payer: BLUE CROSS/BLUE SHIELD | Source: Ambulatory Visit | Attending: Interventional Cardiology | Admitting: Interventional Cardiology

## 2016-08-27 ENCOUNTER — Encounter (HOSPITAL_COMMUNITY): Payer: BLUE CROSS/BLUE SHIELD

## 2016-08-27 DIAGNOSIS — Z955 Presence of coronary angioplasty implant and graft: Secondary | ICD-10-CM

## 2016-08-27 DIAGNOSIS — I2102 ST elevation (STEMI) myocardial infarction involving left anterior descending coronary artery: Secondary | ICD-10-CM

## 2016-08-27 NOTE — Progress Notes (Signed)
Reviewed home exercise with pt today.  Pt plans to walk and do handheld weights for exercise.  Reviewed THR, pulse, RPE, sign and symptoms, NTG use, and when to call 911 or MD.  Also discussed weather considerations and indoor options.  Pt voiced understanding.     Mykalah Saari [employer],MS,ACSM RCEP

## 2016-08-29 ENCOUNTER — Encounter (HOSPITAL_COMMUNITY): Admission: RE | Admit: 2016-08-29 | Payer: BLUE CROSS/BLUE SHIELD | Source: Ambulatory Visit

## 2016-08-29 ENCOUNTER — Encounter (HOSPITAL_COMMUNITY): Payer: BLUE CROSS/BLUE SHIELD

## 2016-09-03 ENCOUNTER — Encounter (HOSPITAL_COMMUNITY): Payer: BLUE CROSS/BLUE SHIELD

## 2016-09-03 ENCOUNTER — Encounter (HOSPITAL_COMMUNITY)
Admission: RE | Admit: 2016-09-03 | Discharge: 2016-09-03 | Disposition: A | Discharge status: Home / Self Care | Payer: BLUE CROSS/BLUE SHIELD | Source: Ambulatory Visit | Attending: Interventional Cardiology | Admitting: Interventional Cardiology

## 2016-09-03 DIAGNOSIS — Z955 Presence of coronary angioplasty implant and graft: Secondary | ICD-10-CM | POA: Diagnosis not present

## 2016-09-03 DIAGNOSIS — I200 Unstable angina: Secondary | ICD-10-CM | POA: Diagnosis not present

## 2016-09-03 DIAGNOSIS — I2102 ST elevation (STEMI) myocardial infarction involving left anterior descending coronary artery: Secondary | ICD-10-CM

## 2016-09-05 ENCOUNTER — Encounter (HOSPITAL_COMMUNITY): Payer: BLUE CROSS/BLUE SHIELD

## 2016-09-07 ENCOUNTER — Encounter (HOSPITAL_COMMUNITY)
Admission: RE | Admit: 2016-09-07 | Discharge: 2016-09-07 | Disposition: A | Discharge status: Home / Self Care | Payer: BLUE CROSS/BLUE SHIELD | Source: Ambulatory Visit | Attending: Interventional Cardiology | Admitting: Interventional Cardiology

## 2016-09-07 DIAGNOSIS — Z955 Presence of coronary angioplasty implant and graft: Secondary | ICD-10-CM

## 2016-09-07 DIAGNOSIS — I2102 ST elevation (STEMI) myocardial infarction involving left anterior descending coronary artery: Secondary | ICD-10-CM

## 2016-09-10 ENCOUNTER — Encounter (HOSPITAL_COMMUNITY)
Admission: RE | Admit: 2016-09-10 | Discharge: 2016-09-10 | Disposition: A | Discharge status: Home / Self Care | Payer: BLUE CROSS/BLUE SHIELD | Source: Ambulatory Visit | Attending: Interventional Cardiology | Admitting: Interventional Cardiology

## 2016-09-10 ENCOUNTER — Encounter (HOSPITAL_COMMUNITY): Payer: BLUE CROSS/BLUE SHIELD

## 2016-09-10 DIAGNOSIS — I2102 ST elevation (STEMI) myocardial infarction involving left anterior descending coronary artery: Secondary | ICD-10-CM

## 2016-09-10 DIAGNOSIS — Z955 Presence of coronary angioplasty implant and graft: Secondary | ICD-10-CM | POA: Diagnosis not present

## 2016-09-10 NOTE — Progress Notes (Signed)
Cardiac Individual Treatment Plan  Patient Details  Name: Tyler Fletcher MRN: [id number] Date of Birth: 02/25/1955 Referring Provider:     CARDIAC REHAB PHASE II ORIENTATION from 07/05/2016 in [facility]  Referring Provider  Smith, Henry MD      Initial Encounter Date:    CARDIAC REHAB PHASE II ORIENTATION from 07/05/2016 in [facility]  Date  07/05/16  Referring Provider  Smith, Henry MD      Visit Diagnosis: 06/12/16 Status post coronary artery stent placement  06/09/16 ST elevation myocardial infarction involving left anterior descending (LAD) coronary artery (HCC)  Patient's Home Medications on Admission:  Current Outpatient Prescriptions:  .  aspirin 81 MG chewable tablet, Chew 1 tablet (81 mg total) by mouth daily., Disp: , Rfl:  .  atorvastatin (LIPITOR) 20 MG tablet, Take 1 tablet (20 mg total) by mouth daily., Disp: 90 tablet, Rfl: 3 .  carvedilol (COREG) 12.5 MG tablet, Take 1 tablet (12.5 mg total) by mouth 2 (two) times daily with a meal., Disp: 180 tablet, Rfl: 3 .  clopidogrel (PLAVIX) 75 MG tablet, Take 1 tablet (75 mg total) by mouth daily with breakfast., Disp: 90 tablet, Rfl: 3 .  irbesartan (AVAPRO) 75 MG tablet, Take 0.5 tablets (37.5 mg total) by mouth daily., Disp: 45 tablet, Rfl: 3 .  loratadine (CLARITIN) 10 MG tablet, Take 10 mg by mouth daily., Disp: , Rfl:  .  nitroGLYCERIN (NITROSTAT) 0.4 MG SL tablet, Place 0.4 mg under the tongue every 5 (five) minutes as needed for chest pain., Disp: , Rfl:  .  ranitidine (ZANTAC) 150 MG tablet, Take 150 mg by mouth daily as needed for heartburn. , Disp: , Rfl:  .  tamsulosin (FLOMAX) 0.4 MG CAPS capsule, Take 0.4 mg by mouth daily., Disp: , Rfl:   Past Medical History: Past Medical History:  Diagnosis Date  . Blood pressure elevated without history of HTN 06/09/2016  . CAD (coronary artery disease), native coronary artery    06/12/16 PCI w/ DES -->mLAD  .  Hyperlipidemia     Tobacco Use: History  Smoking Status  . Never Smoker  Smokeless Tobacco  . Never Used    Labs: Recent Review Flowsheet Data    Labs for ITP Cardiac and Pulmonary Rehab Latest Ref Rng & Units 06/09/2016 06/10/2016 08/17/2016   Cholestrol 100 - 199 mg/dL 155 - 85(L)   LDLCALC 0 - 99 mg/dL 82 - 21   HDL >39 mg/dL 38(L) - 39(L)   Trlycerides 0 - 149 mg/dL 176(H) - 127   Hemoglobin A1c 4.8 - 5.6 % 5.4 5.3 -   TCO2 0 - 100 mmol/L 23 - -      Capillary Blood Glucose: No results found for: GLUCAP   Exercise Target Goals:    Exercise Program Goal: Individual exercise prescription set with THRR, safety & activity barriers. Participant demonstrates ability to understand and report RPE using BORG scale, to self-measure pulse accurately, and to acknowledge the importance of the exercise prescription.  Exercise Prescription Goal: Starting with aerobic activity 30 plus minutes a day, 3 days per week for initial exercise prescription. Provide home exercise prescription and guidelines that participant acknowledges understanding prior to discharge.  Activity Barriers & Risk Stratification:     Activity Barriers & Cardiac Risk Stratification - 07/05/60 0824      Activity Barriers & Cardiac Risk Stratification   Activity Barriers Arthritis;Other (comment);Back Problems   Comments arthritis in L knee, lower   back and R RTC   Cardiac Risk Stratification High      6 Minute Walk:     6 Minute Walk    Row Name 07/05/16 0921 07/05/16 1010       6 Minute Walk   Phase Initial  -    Distance 1767 feet  -    Walk Time 6 minutes  -    # of Rest Breaks 0  -    MPH 3.35  -    METS 4.1  -    RPE 9  -    VO2 Peak 14.4  -    Symptoms No  -    Resting HR 68 bpm  -    Resting BP 132/88  -    Max Ex. HR 87 bpm  -    Max Ex. BP 130/86  -    2 Minute Post BP  - 110/76       Oxygen Initial Assessment:   Oxygen Re-Evaluation:   Oxygen Discharge (Final Oxygen  Re-Evaluation):   Initial Exercise Prescription:     Initial Exercise Prescription - 07/05/16 0900      Date of Initial Exercise RX and Referring Provider   Date 07/05/16   Referring Provider Smith, Henry MD     Treadmill   MPH 3.2   Grade 1   Minutes 10   METs 3.89     Bike   Level 1.2   Minutes 10   METs 3.26     NuStep   Level 3   SPM 80   Minutes 10   METs 2.5     Prescription Details   Frequency (times per week) 3   Duration Progress to 30 minutes of continuous aerobic without signs/symptoms of physical distress     Intensity   THRR 40-80% of Max Heartrate 63-126   Ratings of Perceived Exertion 11-13   Perceived Dyspnea 0-4     Progression   Progression Continue to progress workloads to maintain intensity without signs/symptoms of physical distress.     Resistance Training   Training Prescription Yes   Weight 5lbs   Reps 10-15      Perform Capillary Blood Glucose checks as needed.  Exercise Prescription Changes:      Exercise Prescription Changes    Row Name 07/17/16 1200 07/27/16 1522 08/13/16 1500 08/27/16 1555 09/11/16 1500     Response to Exercise   Blood Pressure (Admit) 120/64 108/78 128/70 114/70 128/72   Blood Pressure (Exercise) 134/72 140/74 140/80 118/74 140/70   Blood Pressure (Exit) 112/80 124/62 108/80 126/68 126/80   Heart Rate (Admit) 78 bpm 74 bpm 63 bpm 72 bpm 76 bpm   Heart Rate (Exercise) 106 bpm 106 bpm 95 bpm 102 bpm 102 bpm   Heart Rate (Exit) 78 bpm 74 bpm 60 bpm 70 bpm 74 bpm   Rating of Perceived Exertion (Exercise) 11 11 11 11 11   Symptoms none none none none none   Comments Pt was oriented to exercise equipment and responded well to exercise  - -  -  -   Duration Continue with 30 min of aerobic exercise without signs/symptoms of physical distress. Continue with 30 min of aerobic exercise without signs/symptoms of physical distress. Continue with 30 min of aerobic exercise without signs/symptoms of physical distress.  Continue with 30 min of aerobic exercise without signs/symptoms of physical distress. Continue with 30 min of aerobic exercise without signs/symptoms of physical distress.   Intensity THRR unchanged   THRR unchanged THRR unchanged THRR unchanged THRR unchanged     Progression   Progression Continue to progress workloads to maintain intensity without signs/symptoms of physical distress. Continue to progress workloads to maintain intensity without signs/symptoms of physical distress. Continue to progress workloads to maintain intensity without signs/symptoms of physical distress. Continue to progress workloads to maintain intensity without signs/symptoms of physical distress. Continue to progress workloads to maintain intensity without signs/symptoms of physical distress.   Average METs 3.2 3.5 3.6 4.7 4.5     Resistance Training   Training Prescription _0    Weight 4lbs 5lbs 5lbs 5lbs 5lbs   Reps 10-15 10-15 10-15 10-15 10-15   Time 10 Minutes 10 Minutes 10 Minutes 10 Minutes 10 Minutes     Treadmill   MPH 3.2 3.2 3.2 3.2 3.2   Grade _1 Minutes _2 METs 3.89 3.89 3.89 3.89 3.89     Bike   Level 1.2 1.2 1.2 1.7 1.7   Minutes _3 METs 3.26 3.31 3.26 4.25 4.25     NuStep   Level _4 -  -   SPM 80 90 90  -  -   Minutes _5 -  -   METs 2.5 3.2 3.6  -  -     Rower   Level  -  -  - 3 3   Watts  -  -  - 60 45   Minutes  -  -  - 10 10   METs  -  -  - 6 5.5     Home Exercise Plan   Plans to continue exercise at  -  -  - Home (comment)  walking and small handheld weights Home (comment)  walking and small handheld weights   Frequency  -  -  - Add 2 additional days to program exercise sessions. Add 2 additional days to program exercise sessions.   Initial Home Exercises Provided  -  -  - 08/27/16 08/27/16      Exercise Comments:      Exercise Comments    Row Name 07/17/16 1226 08/13/16 1526 09/10/16 1630       Exercise  Comments Pt is responding well to exercise in cardiac rehab. Will continue to f/u with pt's activty levels and exercise progression Reviewed METs and goals. Pt is tolerating exercise very well; will continue to monitor pt's progress. Reviewed METs and goals. Pt is tolerating exercise very well; will continue to monitor pt's progress.        Exercise Goals and Review:      Exercise Goals    Row Name 07/05/16 0825             Exercise Goals   Increase Physical Activity Yes       Intervention Provide advice, education, support and counseling about physical activity/exercise needs.;Develop an individualized exercise prescription for aerobic and resistive training based on initial evaluation findings, risk stratification, comorbidities and participant's personal goals.       Expected Outcomes Achievement of increased cardiorespiratory fitness and enhanced flexibility, muscular endurance and strength shown through measurements of functional capacity and personal statement of participant.       Increase Strength and Stamina Yes  learn exercise limitations and learn risk factors       Intervention Provide advice, education, support and counseling about physical activity/exercise needs.;Develop  an individualized exercise prescription for aerobic and resistive training based on initial evaluation findings, risk stratification, comorbidities and participant's personal goals.       Expected Outcomes Achievement of increased cardiorespiratory fitness and enhanced flexibility, muscular endurance and strength shown through measurements of functional capacity and personal statement of participant.          Exercise Goals Re-Evaluation :     Exercise Goals Re-Evaluation    Row Name 08/13/16 1525 08/13/16 1526 09/10/16 1629         Exercise Goal Re-Evaluation   Exercise Goals Review Increase Physical Activity;Increase Strenth and Stamina  - Increase Physical Activity;Increase Strenth and Stamina      Comments Pt is tolerating exercise very well. Pt MET level on stepper has increased from  Pt is tolerating exercise very well. Pt MET level on stepper has increased from 2.5 to 3.6 Pt is walking and doing small handheld weights for exercise at home. Pt has noticed an increase in strength and stamina since starting the program.     Expected Outcomes  - Pt will continue to improve in cardiorespiratory fitness. Pt will continue to improve in cardiorespiratory fitness.         Discharge Exercise Prescription (Final Exercise Prescription Changes):     Exercise Prescription Changes - 09/11/16 1500      Response to Exercise   Blood Pressure (Admit) 128/72   Blood Pressure (Exercise) 140/70   Blood Pressure (Exit) 126/80   Heart Rate (Admit) 76 bpm   Heart Rate (Exercise) 102 bpm   Heart Rate (Exit) 74 bpm   Rating of Perceived Exertion (Exercise) 11   Symptoms none   Duration Continue with 30 min of aerobic exercise without signs/symptoms of physical distress.   Intensity THRR unchanged     Progression   Progression Continue to progress workloads to maintain intensity without signs/symptoms of physical distress.   Average METs 4.5     Resistance Training   Training Prescription Yes   Weight 5lbs   Reps 10-15   Time 10 Minutes     Treadmill   MPH 3.2   Grade 1   Minutes 10   METs 3.89     Bike   Level 1.7   Minutes 10   METs 4.25     Rower   Level 3   Watts 45   Minutes 10   METs 5.5     Home Exercise Plan   Plans to continue exercise at Home (comment)  walking and small handheld weights   Frequency Add 2 additional days to program exercise sessions.   Initial Home Exercises Provided 08/27/16      Nutrition:  Target Goals: Understanding of nutrition guidelines, daily intake of sodium <1546m, cholesterol <2040m calories 30% from fat and 7% or less from saturated fats, daily to have 5 or more servings of fruits and vegetables.  Biometrics:     Pre Biometrics  - 07/05/16 0922      Pre Biometrics   Height 6' 2" (1.88 m)   Weight 216 lb 4.3 oz (98.1 kg)   Waist Circumference 43 inches   Hip Circumference 42.5 inches   Waist to Hip Ratio 1.01 %   BMI (Calculated) 27.8   Triceps Skinfold 20 mm   % Body Fat 29.4 %   Grip Strength 35 kg   Flexibility 13 in   Single Leg Stand 30 seconds       Nutrition Therapy Plan and Nutrition Goals:  Nutrition Therapy & Goals - 08/13/16 1215      Nutrition Therapy   Diet Therapeutic Lifestyle Changes     Personal Nutrition Goals   Nutrition Goal Wt loss of 1-2 lb/week to a wt loss goal of 5 lb at graduation from [place], educate and counsel regarding individualized specific dietary modifications aiming towards targeted core components such as weight, hypertension, lipid management, diabetes, heart failure and other comorbidities.   Expected Outcomes Short Term Goal: Understand basic principles of dietary content, such as calories, fat, sodium, cholesterol and nutrients.;Long Term Goal: Adherence to prescribed nutrition plan.      Nutrition Discharge: Nutrition Scores:     Nutrition Assessments - 08/13/16 1215      MEDFICTS Scores   Pre Score --  Pt to return Wed 08/15/16      Nutrition Goals Re-Evaluation:   Nutrition Goals Re-Evaluation:   Nutrition Goals Discharge (Final Nutrition Goals Re-Evaluation):   Psychosocial: Target Goals: Acknowledge presence or absence of significant depression and/or stress, maximize coping skills, provide positive support system. Participant is able to verbalize types and ability to use techniques and skills needed for reducing stress and depression.  Initial Review & Psychosocial Screening:     Initial Psych Review & Screening - 07/05/16 1204      Initial Review   Current issues with Current Stress Concerns   Source of Stress Concerns Family   Comments Pt has 20 y o son; Father paseed away  on the day of pt cardiac event/Stemi     [place]? Yes   Concerns Recent loss of significant other   Comments father passed away - lived in [facility]     Barriers   Psychosocial barriers to participate in program The patient should benefit from training in stress management and relaxation.      Quality of Life Scores:     Quality of Life - 08/27/16 1233      Quality of Life Scores   Health/Function Pre 27.6 %   Socioeconomic Pre 29.14 %   Psych/Spiritual Pre 25.71 %   Family Pre 30 %   GLOBAL Pre 27.88 %      PHQ-9: Recent Review Flowsheet Data    Depression screen [facility] 2/9 07/11/2016   Decreased Interest 0   Down, Depressed, Hopeless 0   PHQ - 2 Score 0     Interpretation of Total Score  Total Score Depression Severity:  1-4 = Minimal depression, 5-9 = Mild depression, 10-14 = Moderate depression, 15-19 = Moderately severe depression, 20-27 = Severe depression   Psychosocial Evaluation and Intervention:     Psychosocial Evaluation - 09/10/16 1253      Psychosocial Evaluation & Interventions   Interventions Stress management education;Relaxation education   Continue Psychosocial Services  No Follow up required      Psychosocial Re-Evaluation:     Psychosocial Re-Evaluation    [place] Name 08/12/16 2105 09/10/16 1253           Psychosocial Re-Evaluation   Current issues with None Identified None Identified      Interventions Stress management education;Encouraged to attend Cardiac Rehabilitation for the exercise;Relaxation education Stress management education;Encouraged to attend Cardiac Rehabilitation for the exercise;Relaxation education      Continue Psychosocial Services  Follow up required by staff No Follow up required         Psychosocial Discharge (Final Psychosocial Re-Evaluation):  Psychosocial Re-Evaluation - 09/10/16 1253      Psychosocial Re-Evaluation   Current issues with None Identified   Interventions Stress  management education;Encouraged to attend Cardiac Rehabilitation for the exercise;Relaxation education   Continue Psychosocial Services  No Follow up required      Vocational Rehabilitation: Provide vocational rehab assistance to qualifying candidates.   Vocational Rehab Evaluation & Intervention:   Education: Education Goals: Education classes will be provided on a weekly basis, covering required topics. Participant will state understanding/return demonstration of topics presented.  Learning Barriers/Preferences:     Learning Barriers/Preferences - 07/05/16 5883      Learning Barriers/Preferences   Learning Barriers Sight   Learning Preferences Skilled Demonstration      Education Topics: Count Your Pulse:  -Group instruction provided by verbal instruction, demonstration, patient participation and written materials to support subject.  Instructors address importance of being able to find your pulse and how to count your pulse when at home without a heart monitor.  Patients get hands on experience counting their pulse with staff help and individually.   CARDIAC REHAB PHASE II EXERCISE from 09/07/2016 in [place]  Date  08/03/16  Instruction Review Code  2- meets goals/outcomes      Heart Attack, Angina, and Risk Factor Modification:  -Group instruction provided by verbal instruction, video, and written materials to support subject.  Instructors address signs and symptoms of angina and heart attacks.    Also discuss risk factors for heart disease and how to make changes to improve heart health risk factors.   CARDIAC REHAB PHASE II EXERCISE from 09/07/2016 in [place]  Date  08/22/16  Instruction Review Code  2- meets goals/outcomes      Functional Fitness:  -Group instruction provided by verbal instruction, demonstration, patient participation, and written materials to support subject.  Instructors address  safety measures for doing things around the house.  Discuss how to get up and down off the floor, how to pick things up properly, how to safely get out of a chair without assistance, and balance training.   CARDIAC REHAB PHASE II EXERCISE from 09/07/2016 in [place]  Date  08/17/16  Educator  ep  Instruction Review Code  2- meets goals/outcomes      Meditation and Mindfulness:  -Group instruction provided by verbal instruction, patient participation, and written materials to support subject.  Instructor addresses importance of mindfulness and meditation practice to help reduce stress and improve awareness.  Instructor also leads participants through a meditation exercise.    Stretching for Flexibility and Mobility:  -Group instruction provided by verbal instruction, patient participation, and written materials to support subject.  Instructors lead participants through series of stretches that are designed to increase flexibility thus improving mobility.  These stretches are additional exercise for major muscle groups that are typically performed during regular warm up and cool down.   CARDIAC REHAB PHASE II EXERCISE from 09/07/2016 in [place]  Date  07/20/16  Instruction Review Code  2- meets goals/outcomes      Hands Only CPR:  -Group verbal, video, and participation provides a basic overview of AHA guidelines for community CPR. Role-play of emergencies allow participants the opportunity to practice calling for help and chest compression technique with discussion of AED use.   Hypertension: -Group verbal and written instruction that provides a basic overview of hypertension including the most recent diagnostic guidelines, risk factor  reduction with self-care instructions and medication management.   CARDIAC REHAB PHASE II EXERCISE from 09/07/2016 in [place]  Date  09/07/16  Instruction  Review Code  2- meets goals/outcomes       Nutrition I class: Heart Healthy Eating:  -Group instruction provided by PowerPoint slides, verbal discussion, and written materials to support subject matter. The instructor gives an explanation and review of the Therapeutic Lifestyle Changes diet recommendations, which includes a discussion on lipid goals, dietary fat, sodium, fiber, plant stanol/sterol esters, sugar, and the components of a well-balanced, healthy diet.   CARDIAC REHAB PHASE II EXERCISE from 09/07/2016 in [place]  Date  08/13/16  Educator  RD  Instruction Review Code  Not applicable      Nutrition II class: Lifestyle Skills:  -Group instruction provided by PowerPoint slides, verbal discussion, and written materials to support subject matter. The instructor gives an explanation and review of label reading, grocery shopping for heart health, heart healthy recipe modifications, and ways to make healthier choices when eating out.   CARDIAC REHAB PHASE II EXERCISE from 09/07/2016 in [place]  Date  08/13/16  Educator  RD  Instruction Review Code  Not applicable      Diabetes Question & Answer:  -Group instruction provided by PowerPoint slides, verbal discussion, and written materials to support subject matter. The instructor gives an explanation and review of diabetes co-morbidities, pre- and post-prandial blood glucose goals, pre-exercise blood glucose goals, signs, symptoms, and treatment of hypoglycemia and hyperglycemia, and foot care basics.   Diabetes Blitz:  -Group instruction provided by PowerPoint slides, verbal discussion, and written materials to support subject matter. The instructor gives an explanation and review of the physiology behind type 1 and type 2 diabetes, diabetes medications and rational behind using different medications, pre- and post-prandial blood glucose recommendations and Hemoglobin  A1c goals, diabetes diet, and exercise including blood glucose guidelines for exercising safely.    Portion Distortion:  -Group instruction provided by PowerPoint slides, verbal discussion, written materials, and food models to support subject matter. The instructor gives an explanation of serving size versus portion size, changes in portions sizes over the last 20 years, and what consists of a serving from each food group.   CARDIAC REHAB PHASE II EXERCISE from 09/07/2016 in [place]  Date  07/18/16  Educator  RD  Instruction Review Code  2- meets goals/outcomes      Stress Management:  -Group instruction provided by verbal instruction, video, and written materials to support subject matter.  Instructors review role of stress in heart disease and how to cope with stress positively.     Exercising on Your Own:  -Group instruction provided by verbal instruction, power point, and written materials to support subject.  Instructors discuss benefits of exercise, components of exercise, frequency and intensity of exercise, and end points for exercise.  Also discuss use of nitroglycerin and activating EMS.  Review options of places to exercise outside of rehab.  Review guidelines for sex with heart disease.   Cardiac Drugs I:  -Group instruction provided by verbal instruction and written materials to support subject.  Instructor reviews cardiac drug classes: antiplatelets, anticoagulants, beta blockers, and statins.  Instructor discusses reasons, side effects, and lifestyle considerations for each drug class.   Cardiac Drugs II:  -Group instruction provided by verbal instruction and written materials to support subject.  Instructor reviews cardiac drug classes:  angiotensin converting enzyme inhibitors (ACE-I), angiotensin II receptor blockers (ARBs), nitrates, and calcium channel blockers.  Instructor discusses reasons, side effects, and lifestyle considerations for  each drug class.   CARDIAC REHAB PHASE II EXERCISE from 09/07/2016 in [place]  Date  07/11/16  Instruction Review Code  2- meets goals/outcomes      Anatomy and Physiology of the Circulatory System:  Group verbal and written instruction and models provide basic cardiac anatomy and physiology, with the coronary electrical and arterial systems. Review of: AMI, Angina, Valve disease, Heart Failure, Peripheral Artery Disease, Cardiac Arrhythmia, Pacemakers, and the ICD.   Other Education:  -Group or individual verbal, written, or video instructions that support the educational goals of the cardiac rehab program.   Knowledge Questionnaire Score:   Core Components/Risk Factors/Patient Goals at Admission:     Personal Goals and Risk Factors at Admission - 07/05/16 1011      Core Components/Risk Factors/Patient Goals on Admission    Weight Management Yes;Weight Maintenance;Weight Loss   Intervention Weight Management: Develop a combined nutrition and exercise program designed to reach desired caloric intake, while maintaining appropriate intake of nutrient and fiber, sodium and fats, and appropriate energy expenditure required for the weight goal.;Weight Management: Provide education and appropriate resources to help participant work on and attain dietary goals.;Weight Management/Obesity: Establish reasonable short term and long term weight goals.   Expected Outcomes Short Term: Continue to assess and modify interventions until short term weight is achieved;Long Term: Adherence to nutrition and physical activity/exercise program aimed toward attainment of established weight goal;Weight Maintenance: Understanding of the daily nutrition guidelines, which includes 25-35% calories from fat, 7% or less cal from saturated fats, less than 296m cholesterol, less than 1.5gm of sodium, & 5 or more servings of fruits and vegetables daily;Weight Loss: Understanding of general  recommendations for a balanced deficit meal plan, which promotes 1-2 lb weight loss per week and includes a negative energy balance of [phone] kcal/d;Understanding of distribution of calorie intake throughout the day with the consumption of 4-5 meals/snacks;Understanding recommendations for meals to include 15-35% energy as protein, 25-35% energy from fat, 35-60% energy from carbohydrates, less than 2079mof dietary cholesterol, 20-35 gm of total fiber daily   Stress Yes   Intervention Offer individual and/or small group education and counseling on adjustment to heart disease, stress management and health-related lifestyle change. Teach and support self-help strategies.;Refer participants experiencing significant psychosocial distress to appropriate mental health specialists for further evaluation and treatment. When possible, include family members and significant others in education/counseling sessions.   Expected Outcomes Short Term: Participant demonstrates changes in health-related behavior, relaxation and other stress management skills, ability to obtain effective social support, and compliance with psychotropic medications if prescribed.;Long Term: Emotional wellbeing is indicated by absence of clinically significant psychosocial distress or social isolation.   Personal Goal Other Yes   Personal Goal Learn risk factor modifications and improve knowledge on CVD   Intervention Provide cardiac education classes geared towards reducing modifiable risk factors and improving awareness on CVD's   Expected Outcomes Pt will have increase understanding of CVD's and apply risk factor modifications to lufestyle to reduce future occurrences of CVD's.      Core Components/Risk Factors/Patient Goals Review:      Goals and Risk Factor Review    Row Name 07/16/16 2302 08/12/16 2103 09/10/16 1251         Core Components/Risk Factors/Patient Goals Review   Personal Goals Review Weight  Management/Obesity;Lipids;Hypertension;Stress Weight  Management/Obesity;Lipids;Hypertension;Stress Weight Management/Obesity;Lipids;Hypertension;Stress     Review Pt is off to a great start with the completion of 3 exercise sessions.   Continue to encourage attendance to education classess.  Continue to encourage attendance to education classess.  Continue to encourage attendance to education classess. Pt with improved bp readings at cardiac rehab. Imrpoved stress level. Making progress toward weight loss.     Expected Outcomes Pt will maintain healthy weight, lipids and bp within normal limits and positive and healthy coping skills for stress management. Pt will maintain healthy weight, lipids and bp within normal limits and positive and healthy coping skills for stress management. Pt will maintain healthy weight, lipids and bp within normal limits and positive and healthy coping skills for stress management.        Core Components/Risk Factors/Patient Goals at Discharge (Final Review):      Goals and Risk Factor Review - 09/10/16 1251      Core Components/Risk Factors/Patient Goals Review   Personal Goals Review Weight Management/Obesity;Lipids;Hypertension;Stress   Review  Continue to encourage attendance to education classess. Pt with improved bp readings at cardiac rehab. Imrpoved stress level. Making progress toward weight loss.   Expected Outcomes Pt will maintain healthy weight, lipids and bp within normal limits and positive and healthy coping skills for stress management.      ITP Comments:     ITP Comments    Row Name 07/05/16 0819           ITP Comments Medical Director, Dr. Traci Turner          Comments:  Keyler is making expected progress toward personal goals after completing 17 sessions. Psychosocial Assessment Pt exhibits positive coping skills, hopeful outlook with supportive family. No psychosocial needs identified at this time, no psychosocial interventions  necessary.  Recommend continued exercise and life style modification education including  stress management and relaxation techniques to decrease cardiac risk profile. Carlette Carlton RN, BSN Cardiac and Pulmonary Rehab Nurse Navigator    

## 2016-09-10 NOTE — Progress Notes (Signed)
Tyler Fletcher 61 y.o. male       Nutrition Note  1. 06/12/16 Status post coronary artery stent placement   2. 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 Nutrition survey completed with pt. Nutrition survey and nutrition plan reviewed with pt. Pt is following Step 2 of the TLC diet. Pt expressed understanding of the information reviewed. Pt aware of nutrition education classes offered.  Nutrition Diagnosis ? Food-and nutrition-related knowledge deficit related to lack of exposure to information as related to diagnosis of: ? CVD ? Overweight related to excessive energy intake as evidenced by a BMI of 27.6  Nutrition Intervention ? Pt's individual nutrition plan and goals reviewed with pt. ? Pt given handouts for: ? Nutrition I class ? Nutrition II class   Nutrition Goal(s):  ? Pt to identify and limit food sources of saturated fat, trans fat, and sodium - meeting ? Pt to identify food quantities necessary to achieve weight loss of 5 lb at graduation from cardiac rehab. - continue  Plan:  Pt to attend nutrition classes ? Portion Distortion - met 07/18/16 Will provide client-centered nutrition education as part of interdisciplinary care.   Monitor and evaluate progress toward nutrition goal with team.  Derek Mound, M.Ed, RD, LDN, CDE 09/10/2016 12:07 PM

## 2016-09-12 ENCOUNTER — Encounter (HOSPITAL_COMMUNITY): Payer: BLUE CROSS/BLUE SHIELD

## 2016-09-14 ENCOUNTER — Encounter (HOSPITAL_COMMUNITY)
Admission: RE | Admit: 2016-09-14 | Discharge: 2016-09-14 | Disposition: A | Discharge status: Home / Self Care | Payer: BLUE CROSS/BLUE SHIELD | Source: Ambulatory Visit | Attending: Interventional Cardiology | Admitting: Interventional Cardiology

## 2016-09-14 DIAGNOSIS — Z955 Presence of coronary angioplasty implant and graft: Secondary | ICD-10-CM | POA: Diagnosis not present

## 2016-09-17 ENCOUNTER — Encounter (HOSPITAL_COMMUNITY): Payer: BLUE CROSS/BLUE SHIELD

## 2016-09-19 ENCOUNTER — Encounter (HOSPITAL_COMMUNITY)
Admission: RE | Admit: 2016-09-19 | Discharge: 2016-09-19 | Disposition: A | Discharge status: Home / Self Care | Payer: BLUE CROSS/BLUE SHIELD | Source: Ambulatory Visit | Attending: Interventional Cardiology | Admitting: Interventional Cardiology

## 2016-09-19 ENCOUNTER — Encounter (HOSPITAL_COMMUNITY): Payer: BLUE CROSS/BLUE SHIELD

## 2016-09-19 DIAGNOSIS — Z955 Presence of coronary angioplasty implant and graft: Secondary | ICD-10-CM | POA: Diagnosis not present

## 2016-09-19 DIAGNOSIS — I2102 ST elevation (STEMI) myocardial infarction involving left anterior descending coronary artery: Secondary | ICD-10-CM

## 2016-09-24 ENCOUNTER — Encounter (HOSPITAL_COMMUNITY): Payer: BLUE CROSS/BLUE SHIELD

## 2016-09-24 ENCOUNTER — Encounter (HOSPITAL_COMMUNITY)
Admission: RE | Admit: 2016-09-24 | Discharge: 2016-09-24 | Disposition: A | Discharge status: Home / Self Care | Payer: BLUE CROSS/BLUE SHIELD | Source: Ambulatory Visit | Attending: Interventional Cardiology | Admitting: Interventional Cardiology

## 2016-09-24 DIAGNOSIS — I2102 ST elevation (STEMI) myocardial infarction involving left anterior descending coronary artery: Secondary | ICD-10-CM

## 2016-09-24 DIAGNOSIS — Z955 Presence of coronary angioplasty implant and graft: Secondary | ICD-10-CM

## 2016-09-26 ENCOUNTER — Encounter (HOSPITAL_COMMUNITY)
Admission: RE | Admit: 2016-09-26 | Discharge: 2016-09-26 | Disposition: A | Discharge status: Home / Self Care | Payer: BLUE CROSS/BLUE SHIELD | Source: Ambulatory Visit | Attending: Interventional Cardiology | Admitting: Interventional Cardiology

## 2016-09-26 ENCOUNTER — Encounter (HOSPITAL_COMMUNITY): Payer: BLUE CROSS/BLUE SHIELD

## 2016-09-26 DIAGNOSIS — Z955 Presence of coronary angioplasty implant and graft: Secondary | ICD-10-CM

## 2016-09-26 DIAGNOSIS — I2102 ST elevation (STEMI) myocardial infarction involving left anterior descending coronary artery: Secondary | ICD-10-CM

## 2016-10-03 ENCOUNTER — Encounter (HOSPITAL_COMMUNITY)
Admission: RE | Admit: 2016-10-03 | Discharge: 2016-10-03 | Disposition: A | Discharge status: Home / Self Care | Payer: 59 | Source: Ambulatory Visit | Attending: Interventional Cardiology | Admitting: Interventional Cardiology

## 2016-10-03 ENCOUNTER — Encounter (HOSPITAL_COMMUNITY): Payer: 59

## 2016-10-03 DIAGNOSIS — I200 Unstable angina: Secondary | ICD-10-CM | POA: Diagnosis not present

## 2016-10-03 DIAGNOSIS — I2102 ST elevation (STEMI) myocardial infarction involving left anterior descending coronary artery: Secondary | ICD-10-CM

## 2016-10-03 DIAGNOSIS — Z955 Presence of coronary angioplasty implant and graft: Secondary | ICD-10-CM | POA: Insufficient documentation

## 2016-10-08 ENCOUNTER — Encounter (HOSPITAL_COMMUNITY): Payer: 59

## 2016-10-08 ENCOUNTER — Encounter (HOSPITAL_COMMUNITY)
Admission: RE | Admit: 2016-10-08 | Discharge: 2016-10-08 | Disposition: A | Discharge status: Home / Self Care | Payer: 59 | Source: Ambulatory Visit | Attending: Interventional Cardiology | Admitting: Interventional Cardiology

## 2016-10-08 DIAGNOSIS — I2102 ST elevation (STEMI) myocardial infarction involving left anterior descending coronary artery: Secondary | ICD-10-CM

## 2016-10-08 DIAGNOSIS — Z955 Presence of coronary angioplasty implant and graft: Secondary | ICD-10-CM | POA: Diagnosis not present

## 2016-10-09 NOTE — Progress Notes (Signed)
Cardiac Individual Treatment Plan  Patient Details  Name: Tyler Fletcher MRN: [id number] Date of Birth: 08-15-1955 Referring Provider:     CARDIAC REHAB PHASE II ORIENTATION from 07/05/2016 in [facility]  Referring Provider  Daneen Schick MD      Initial Encounter Date:    [place] from 07/05/2016 in [facility]  Date  07/05/16  Referring Provider  Daneen Schick MD      Visit Diagnosis: 06/09/16 ST elevation myocardial infarction involving left anterior descending (LAD) coronary artery ([place])  06/12/16 Status post coronary artery stent placement  Patient's Home Medications on Admission:  Current Outpatient Prescriptions:  .  aspirin 81 MG chewable tablet, Chew 1 tablet (81 mg total) by mouth daily., Disp: , Rfl:  .  atorvastatin (LIPITOR) 20 MG tablet, Take 1 tablet (20 mg total) by mouth daily., Disp: 90 tablet, Rfl: 3 .  carvedilol (COREG) 12.5 MG tablet, Take 1 tablet (12.5 mg total) by mouth 2 (two) times daily with a meal., Disp: 180 tablet, Rfl: 3 .  clopidogrel (PLAVIX) 75 MG tablet, Take 1 tablet (75 mg total) by mouth daily with breakfast., Disp: 90 tablet, Rfl: 3 .  irbesartan (AVAPRO) 75 MG tablet, Take 0.5 tablets (37.5 mg total) by mouth daily., Disp: 45 tablet, Rfl: 3 .  loratadine (CLARITIN) 10 MG tablet, Take 10 mg by mouth daily., Disp: , Rfl:  .  nitroGLYCERIN (NITROSTAT) 0.4 MG SL tablet, Place 0.4 mg under the tongue every 5 (five) minutes as needed for chest pain., Disp: , Rfl:  .  ranitidine (ZANTAC) 150 MG tablet, Take 150 mg by mouth daily as needed for heartburn. , Disp: , Rfl:  .  tamsulosin (FLOMAX) 0.4 MG CAPS capsule, Take 0.4 mg by mouth daily., Disp: , Rfl:   Past Medical History: Past Medical History:  Diagnosis Date  . Blood pressure elevated without history of HTN 06/09/2016  . CAD (coronary artery disease), native coronary artery    06/12/16 PCI w/ DES -->mLAD  .  Hyperlipidemia     Tobacco Use: History  Smoking Status  . Never Smoker  Smokeless Tobacco  . Never Used    Labs: Recent Review Flowsheet Data    Labs for ITP Cardiac and Pulmonary Rehab Latest Ref Rng & Units 06/09/2016 06/10/2016 08/17/2016   Cholestrol 100 - 199 mg/dL 155 - 85(L)   LDLCALC 0 - 99 mg/dL 82 - 21   HDL >39 mg/dL 38(L) - 39(L)   Trlycerides 0 - 149 mg/dL 176(H) - 127   Hemoglobin A1c 4.8 - 5.6 % 5.4 5.3 -   TCO2 0 - 100 mmol/L 23 - -      Capillary Blood Glucose: No results found for: GLUCAP   Exercise Target Goals:    Exercise Program Goal: Individual exercise prescription set with THRR, safety & activity barriers. Participant demonstrates ability to understand and report RPE using BORG scale, to self-measure pulse accurately, and to acknowledge the importance of the exercise prescription.  Exercise Prescription Goal: Starting with aerobic activity 30 plus minutes a day, 3 days per week for initial exercise prescription. Provide home exercise prescription and guidelines that participant acknowledges understanding prior to discharge.  Activity Barriers & Risk Stratification:     Activity Barriers & Cardiac Risk Stratification - 07/05/16 0824      Activity Barriers & Cardiac Risk Stratification   Activity Barriers Arthritis;Other (comment);Back Problems   Comments arthritis in L knee, lower  back and R RTC   Cardiac Risk Stratification High      6 Minute Walk:     6 Minute Walk    Row Name 07/05/16 0921 07/05/16 1010       6 Minute Walk   Phase Initial  -    Distance 1767 feet  -    Walk Time 6 minutes  -    # of Rest Breaks 0  -    MPH 3.35  -    METS 4.1  -    RPE 9  -    VO2 Peak 14.4  -    Symptoms No  -    Resting HR 68 bpm  -    Resting BP 132/88  -    Max Ex. HR 87 bpm  -    Max Ex. BP 130/86  -    2 Minute Post BP  - 110/76       Oxygen Initial Assessment:   Oxygen Re-Evaluation:   Oxygen Discharge (Final Oxygen  Re-Evaluation):   Initial Exercise Prescription:     Initial Exercise Prescription - 07/05/16 0900      Date of Initial Exercise RX and Referring Provider   Date 07/05/16   Referring Provider Smith, Henry MD     Treadmill   MPH 3.2   Grade 1   Minutes 10   METs 3.89     Bike   Level 1.2   Minutes 10   METs 3.26     NuStep   Level 3   SPM 80   Minutes 10   METs 2.5     Prescription Details   Frequency (times per week) 3   Duration Progress to 30 minutes of continuous aerobic without signs/symptoms of physical distress     Intensity   THRR 40-80% of Max Heartrate 63-126   Ratings of Perceived Exertion 11-13   Perceived Dyspnea 0-4     Progression   Progression Continue to progress workloads to maintain intensity without signs/symptoms of physical distress.     Resistance Training   Training Prescription Yes   Weight 5lbs   Reps 10-15      Perform Capillary Blood Glucose checks as needed.  Exercise Prescription Changes:      Exercise Prescription Changes    Row Name 07/17/16 1200 07/27/16 1522 08/13/16 1500 08/27/16 1555 09/11/16 1500     Response to Exercise   Blood Pressure (Admit) 120/64 108/78 128/70 114/70 128/72   Blood Pressure (Exercise) 134/72 140/74 140/80 118/74 140/70   Blood Pressure (Exit) 112/80 124/62 108/80 126/68 126/80   Heart Rate (Admit) 78 bpm 74 bpm 63 bpm 72 bpm 76 bpm   Heart Rate (Exercise) 106 bpm 106 bpm 95 bpm 102 bpm 102 bpm   Heart Rate (Exit) 78 bpm 74 bpm 60 bpm 70 bpm 74 bpm   Rating of Perceived Exertion (Exercise) 11 11 11 11 11   Symptoms none none none none none   Comments Pt was oriented to exercise equipment and responded well to exercise  - -  -  -   Duration Continue with 30 min of aerobic exercise without signs/symptoms of physical distress. Continue with 30 min of aerobic exercise without signs/symptoms of physical distress. Continue with 30 min of aerobic exercise without signs/symptoms of physical distress.  Continue with 30 min of aerobic exercise without signs/symptoms of physical distress. Continue with 30 min of aerobic exercise without signs/symptoms of physical distress.   Intensity THRR unchanged   THRR unchanged THRR unchanged THRR unchanged THRR unchanged     Progression   Progression Continue to progress workloads to maintain intensity without signs/symptoms of physical distress. Continue to progress workloads to maintain intensity without signs/symptoms of physical distress. Continue to progress workloads to maintain intensity without signs/symptoms of physical distress. Continue to progress workloads to maintain intensity without signs/symptoms of physical distress. Continue to progress workloads to maintain intensity without signs/symptoms of physical distress.   Average METs 3.2 3.5 3.6 4.7 4.5     Resistance Training   Training Prescription _0    Weight 4lbs 5lbs 5lbs 5lbs 5lbs   Reps 10-15 10-15 10-15 10-15 10-15   Time 10 Minutes 10 Minutes 10 Minutes 10 Minutes 10 Minutes     Treadmill   MPH 3.2 3.2 3.2 3.2 3.2   Grade _1 Minutes _2 METs 3.89 3.89 3.89 3.89 3.89     Bike   Level 1.2 1.2 1.2 1.7 1.7   Minutes _3 METs 3.26 3.31 3.26 4.25 4.25     NuStep   Level _4 -  -   SPM 80 90 90  -  -   Minutes _5 -  -   METs 2.5 3.2 3.6  -  -     Rower   Level  -  -  - 3 3   Watts  -  -  - 60 45   Minutes  -  -  - 10 10   METs  -  -  - 6 5.5     Home Exercise Plan   Plans to continue exercise at  -  -  - Home (comment)  walking and small handheld weights Home (comment)  walking and small handheld weights   Frequency  -  -  - Add 2 additional days to program exercise sessions. Add 2 additional days to program exercise sessions.   Initial Home Exercises Provided  -  -  - 08/27/16 08/27/16   Row Name 09/26/16 1606 10/04/16 1600           Response to Exercise   Blood Pressure (Admit) 118/64 130/70      Blood  Pressure (Exercise) 148/62 134/70      Blood Pressure (Exit) 134/70 120/68      Heart Rate (Admit) 89 bpm 73 bpm      Heart Rate (Exercise) 111 bpm 102 bpm      Heart Rate (Exit) 89 bpm 72 bpm      Rating of Perceived Exertion (Exercise) 11 11      Symptoms none none      Duration Continue with 30 min of aerobic exercise without signs/symptoms of physical distress. Continue with 30 min of aerobic exercise without signs/symptoms of physical distress.      Intensity THRR unchanged THRR unchanged        Progression   Progression Continue to progress workloads to maintain intensity without signs/symptoms of physical distress. Continue to progress workloads to maintain intensity without signs/symptoms of physical distress.      Average METs 4.9 5.1        Resistance Training   Training Prescription Yes Yes      Weight 5lbs 5lbs      Reps 10-15 10-15      Time 10 Minutes 10 Minutes  Treadmill   MPH 3.5 3.5      Grade 3 3      Minutes 10 10      METs 5.13 5.13        Bike   Level 1.7 1.7      Minutes 10 10      METs 4.27 4.27        Rower   Level 3 3      Watts 50 70      Minutes 10 10      METs 5.5 6        Home Exercise Plan   Plans to continue exercise at Home (comment)  walking and small handhld weights Home (comment)  walking and small handhld weights      Frequency Add 2 additional days to program exercise sessions. Add 2 additional days to program exercise sessions.      Initial Home Exercises Provided 08/27/16 08/27/16         Exercise Comments:      Exercise Comments    Row Name 07/17/16 1226 08/13/16 1526 09/10/16 1630 10/04/16 1609     Exercise Comments Pt is responding well to exercise in cardiac rehab. Will continue to f/u with pt's activty levels and exercise progression Reviewed METs and goals. Pt is tolerating exercise very well; will continue to monitor pt's progress. Reviewed METs and goals. Pt is tolerating exercise very well; will continue to  monitor pt's progress. Reviewed METs and goals. Pt is tolerating exercise very well; will continue to monitor pt's progress.       Exercise Goals and Review:      Exercise Goals    Row Name 07/05/16 0825             Exercise Goals   Increase Physical Activity Yes       Intervention Provide advice, education, support and counseling about physical activity/exercise needs.;Develop an individualized exercise prescription for aerobic and resistive training based on initial evaluation findings, risk stratification, comorbidities and participant's personal goals.       Expected Outcomes Achievement of increased cardiorespiratory fitness and enhanced flexibility, muscular endurance and strength shown through measurements of functional capacity and personal statement of participant.       Increase Strength and Stamina Yes  learn exercise limitations and learn risk factors       Intervention Provide advice, education, support and counseling about physical activity/exercise needs.;Develop an individualized exercise prescription for aerobic and resistive training based on initial evaluation findings, risk stratification, comorbidities and participant's personal goals.       Expected Outcomes Achievement of increased cardiorespiratory fitness and enhanced flexibility, muscular endurance and strength shown through measurements of functional capacity and personal statement of participant.          Exercise Goals Re-Evaluation :     Exercise Goals Re-Evaluation    Row Name 08/13/16 1525 08/13/16 1526 09/10/16 1629 10/04/16 1609       Exercise Goal Re-Evaluation   Exercise Goals Review Increase Physical Activity;Increase Strenth and Stamina  - Increase Physical Activity;Increase Strenth and Stamina Increase Physical Activity;Able to understand and use rate of perceived exertion (RPE) scale;Knowledge and understanding of Target Heart Rate Range (THRR);Understanding of Exercise Prescription;Able to  check pulse independently;Increase Strength and Stamina    Comments Pt is tolerating exercise very well. Pt MET level on stepper has increased from  Pt is tolerating exercise very well. Pt MET level on stepper has increased from 2.5 to 3.6 Pt is walking  and doing small handheld weights for exercise at home. Pt has noticed an increase in strength and stamina since starting the program. Pt is making great progess in cardiac rehab and handles WL increases very well. Pt is now using the rower machine for core and UE strength and is currently averaging 70 watts.    Expected Outcomes  - Pt will continue to improve in cardiorespiratory fitness. Pt will continue to improve in cardiorespiratory fitness. Pt will continue to improve in cardiorespiratory fitness and muscular strength/endurance        Discharge Exercise Prescription (Final Exercise Prescription Changes):     Exercise Prescription Changes - 10/04/16 1600      Response to Exercise   Blood Pressure (Admit) 130/70   Blood Pressure (Exercise) 134/70   Blood Pressure (Exit) 120/68   Heart Rate (Admit) 73 bpm   Heart Rate (Exercise) 102 bpm   Heart Rate (Exit) 72 bpm   Rating of Perceived Exertion (Exercise) 11   Symptoms none   Duration Continue with 30 min of aerobic exercise without signs/symptoms of physical distress.   Intensity THRR unchanged     Progression   Progression Continue to progress workloads to maintain intensity without signs/symptoms of physical distress.   Average METs 5.1     Resistance Training   Training Prescription Yes   Weight 5lbs   Reps 10-15   Time 10 Minutes     Treadmill   MPH 3.5   Grade 3   Minutes 10   METs 5.13     Bike   Level 1.7   Minutes 10   METs 4.27     Rower   Level 3   Watts 70   Minutes 10   METs 6     Home Exercise Plan   Plans to continue exercise at Home (comment)  walking and small handhld weights   Frequency Add 2 additional days to program exercise sessions.    Initial Home Exercises Provided 08/27/16      Nutrition:  Target Goals: Understanding of nutrition guidelines, daily intake of sodium <1559m, cholesterol <2095m calories 30% from fat and 7% or less from saturated fats, daily to have 5 or more servings of fruits and vegetables.  Biometrics:     Pre Biometrics - 07/05/16 [id number]    Pre Biometrics   Height _0  (1.88 m)   Weight 216 lb 4.3 oz (98.1 kg)   Waist Circumference 43 inches   Hip Circumference 42.5 inches   Waist to Hip Ratio 1.01 %   BMI (Calculated) 27.8   Triceps Skinfold 20 mm   % Body Fat 29.4 %   Grip Strength 35 kg   Flexibility 13 in   Single Leg Stand 30 seconds       Nutrition Therapy Plan and Nutrition Goals:     Nutrition Therapy & Goals - 08/13/16 1215      Nutrition Therapy   Diet Therapeutic Lifestyle Changes     Personal Nutrition Goals   Nutrition Goal Wt loss of 1-2 lb/week to a wt loss goal of 5 lb at [facility] regarding individualized specific dietary modifications aiming towards targeted core components such as weight, hypertension, lipid management, diabetes, heart failure and other comorbidities.   Expected Outcomes Short Term Goal: Understand basic principles of dietary content, such as calories, fat, sodium, cholesterol and nutrients.;Long Term Goal: Adherence to prescribed nutrition plan.  Nutrition Discharge: Nutrition Scores:     Nutrition Assessments - 08/13/16 1215      MEDFICTS Scores   Pre Score --  Pt to return Wed 08/15/16      Nutrition Goals Re-Evaluation:   Nutrition Goals Re-Evaluation:   Nutrition Goals Discharge (Final Nutrition Goals Re-Evaluation):   Psychosocial: Target Goals: Acknowledge presence or absence of significant depression and/or stress, maximize coping skills, provide positive support system. Participant is able to verbalize types and ability to use  techniques and skills needed for reducing stress and depression.  Initial Review & Psychosocial Screening:     Initial Psych Review & Screening - 07/05/16 1204      Initial Review   Current issues with Current Stress Concerns   Source of Stress Concerns Family   Comments Pt has 8 y o son; Father paseed away on the day of pt cardiac event/Stemi     [place]? Yes   Concerns Recent loss of significant other   Comments father passed away - lived in [facility]     Barriers   Psychosocial barriers to participate in program The patient should benefit from training in stress management and relaxation.      Quality of Life Scores:     Quality of Life - 08/27/16 1233      Quality of Life Scores   Health/Function Pre 27.6 %   Socioeconomic Pre 29.14 %   Psych/Spiritual Pre 25.71 %   Family Pre 30 %   GLOBAL Pre 27.88 %      PHQ-9: Recent Review Flowsheet Data    Depression screen [facility] 2/9 07/11/2016   Decreased Interest 0   Down, Depressed, Hopeless 0   PHQ - 2 Score 0     Interpretation of Total Score  Total Score Depression Severity:  1-4 = Minimal depression, 5-9 = Mild depression, 10-14 = Moderate depression, 15-19 = Moderately severe depression, 20-27 = Severe depression   Psychosocial Evaluation and Intervention:     Psychosocial Evaluation - 10/09/16 2245      Psychosocial Evaluation & Interventions   Interventions Stress management education;Relaxation education   Comments Pt with supportive family, feels good about life.   Continue Psychosocial Services  No Follow up required      Psychosocial Re-Evaluation:     Psychosocial Re-Evaluation    [place] Name 08/12/16 2105 09/10/16 1253 10/09/16 2244         Psychosocial Re-Evaluation   Current issues with None Identified None Identified None Identified     Interventions Stress management education;Encouraged to attend Cardiac Rehabilitation for the exercise;Relaxation education Stress  management education;Encouraged to attend Cardiac Rehabilitation for the exercise;Relaxation education Encouraged to attend Cardiac Rehabilitation for the exercise;Relaxation education;Stress management education     Continue Psychosocial Services  Follow up required by staff No Follow up required No Follow up required        Psychosocial Discharge (Final Psychosocial Re-Evaluation):     Psychosocial Re-Evaluation - 10/09/16 2244      Psychosocial Re-Evaluation   Current issues with None Identified   Interventions Encouraged to attend Cardiac Rehabilitation for the exercise;Relaxation education;Stress management education   Continue Psychosocial Services  No Follow up required      Vocational Rehabilitation: Provide vocational rehab assistance to qualifying candidates.   Vocational Rehab Evaluation & Intervention:   Education: Education Goals: Education classes will be provided on a weekly basis, covering required topics. Participant will state understanding/return demonstration of topics presented.  Learning  Barriers/Preferences:     Learning Barriers/Preferences - 07/05/16 3299      Learning Barriers/Preferences   Learning Barriers Sight   Learning Preferences Skilled Demonstration      Education Topics: Count Your Pulse:  -Group instruction provided by verbal instruction, demonstration, patient participation and written materials to support subject.  Instructors address importance of being able to find your pulse and how to count your pulse when at home without a heart monitor.  Patients get hands on experience counting their pulse with staff help and individually.   CARDIAC REHAB PHASE II EXERCISE from 10/03/2016 in [place]  Date  08/03/16  Instruction Review Code  2- meets goals/outcomes      Heart Attack, Angina, and Risk Factor Modification:  -Group instruction provided by verbal instruction, video, and written materials to  support subject.  Instructors address signs and symptoms of angina and heart attacks.    Also discuss risk factors for heart disease and how to make changes to improve heart health risk factors.   CARDIAC REHAB PHASE II EXERCISE from 10/03/2016 in [place]  Date  08/22/16  Instruction Review Code  2- meets goals/outcomes      Functional Fitness:  -Group instruction provided by verbal instruction, demonstration, patient participation, and written materials to support subject.  Instructors address safety measures for doing things around the house.  Discuss how to get up and down off the floor, how to pick things up properly, how to safely get out of a chair without assistance, and balance training.   CARDIAC REHAB PHASE II EXERCISE from 10/03/2016 in [place]  Date  08/17/16  Educator  ep  Instruction Review Code  2- meets goals/outcomes      Meditation and Mindfulness:  -Group instruction provided by verbal instruction, patient participation, and written materials to support subject.  Instructor addresses importance of mindfulness and meditation practice to help reduce stress and improve awareness.  Instructor also leads participants through a meditation exercise.    Stretching for Flexibility and Mobility:  -Group instruction provided by verbal instruction, patient participation, and written materials to support subject.  Instructors lead participants through series of stretches that are designed to increase flexibility thus improving mobility.  These stretches are additional exercise for major muscle groups that are typically performed during regular warm up and cool down.   CARDIAC REHAB PHASE II EXERCISE from 10/03/2016 in [place]  Date  07/20/16  Instruction Review Code  2- meets goals/outcomes      Hands Only CPR:  -Group verbal, video, and participation provides a basic overview of AHA  guidelines for community CPR. Role-play of emergencies allow participants the opportunity to practice calling for help and chest compression technique with discussion of AED use.   Hypertension: -Group verbal and written instruction that provides a basic overview of hypertension including the most recent diagnostic guidelines, risk factor reduction with self-care instructions and medication management.   CARDIAC REHAB PHASE II EXERCISE from 10/03/2016 in [place]  Date  09/07/16  Instruction Review Code  2- meets goals/outcomes       Nutrition I class: Heart Healthy Eating:  -Group instruction provided by PowerPoint slides, verbal discussion, and written materials to support subject matter. The instructor gives an explanation and review of the Therapeutic Lifestyle Changes diet recommendations, which includes a discussion on lipid goals, dietary fat, sodium, fiber, plant stanol/sterol esters,  sugar, and the components of a well-balanced, healthy diet.   CARDIAC REHAB PHASE II EXERCISE from 10/03/2016 in [place]  Date  08/13/16  Educator  RD  Instruction Review Code  Not applicable      Nutrition II class: Lifestyle Skills:  -Group instruction provided by PowerPoint slides, verbal discussion, and written materials to support subject matter. The instructor gives an explanation and review of label reading, grocery shopping for heart health, heart healthy recipe modifications, and ways to make healthier choices when eating out.   CARDIAC REHAB PHASE II EXERCISE from 10/03/2016 in [place]  Date  08/13/16  Educator  RD  Instruction Review Code  Not applicable      Diabetes Question & Answer:  -Group instruction provided by PowerPoint slides, verbal discussion, and written materials to support subject matter. The instructor gives an explanation and review of diabetes co-morbidities, pre- and  post-prandial blood glucose goals, pre-exercise blood glucose goals, signs, symptoms, and treatment of hypoglycemia and hyperglycemia, and foot care basics.   Diabetes Blitz:  -Group instruction provided by PowerPoint slides, verbal discussion, and written materials to support subject matter. The instructor gives an explanation and review of the physiology behind type 1 and type 2 diabetes, diabetes medications and rational behind using different medications, pre- and post-prandial blood glucose recommendations and Hemoglobin A1c goals, diabetes diet, and exercise including blood glucose guidelines for exercising safely.    Portion Distortion:  -Group instruction provided by PowerPoint slides, verbal discussion, written materials, and food models to support subject matter. The instructor gives an explanation of serving size versus portion size, changes in portions sizes over the last 20 years, and what consists of a serving from each food group.   CARDIAC REHAB PHASE II EXERCISE from 10/03/2016 in [place]  Date  09/19/16  Educator  RD  Instruction Review Code  2- meets goals/outcomes      Stress Management:  -Group instruction provided by verbal instruction, video, and written materials to support subject matter.  Instructors review role of stress in heart disease and how to cope with stress positively.     CARDIAC REHAB PHASE II EXERCISE from 10/03/2016 in [place]  Date  09/26/16  Instruction Review Code  2- meets goals/outcomes      Exercising on Your Own:  -Group instruction provided by verbal instruction, power point, and written materials to support subject.  Instructors discuss benefits of exercise, components of exercise, frequency and intensity of exercise, and end points for exercise.  Also discuss use of nitroglycerin and activating EMS.  Review options of places to exercise outside of rehab.  Review guidelines for  sex with heart disease.   CARDIAC REHAB PHASE II EXERCISE from 10/03/2016 in [place]  Date  10/03/16  Instruction Review Code  2- meets goals/outcomes      Cardiac Drugs I:  -Group instruction provided by verbal instruction and written materials to support subject.  Instructor reviews cardiac drug classes: antiplatelets, anticoagulants, beta blockers, and statins.  Instructor discusses reasons, side effects, and lifestyle considerations for each drug class.   Cardiac Drugs II:  -Group instruction provided by verbal instruction and written materials to support subject.  Instructor reviews cardiac drug classes: angiotensin converting enzyme inhibitors (ACE-I), angiotensin II receptor blockers (ARBs), nitrates, and calcium channel blockers.  Instructor discusses reasons, side effects, and lifestyle considerations for each drug class.  CARDIAC REHAB PHASE II EXERCISE from 10/03/2016 in [place]  Date  07/11/16  Instruction Review Code  2- meets goals/outcomes      Anatomy and Physiology of the Circulatory System:  Group verbal and written instruction and models provide basic cardiac anatomy and physiology, with the coronary electrical and arterial systems. Review of: AMI, Angina, Valve disease, Heart Failure, Peripheral Artery Disease, Cardiac Arrhythmia, Pacemakers, and the ICD.   Other Education:  -Group or individual verbal, written, or video instructions that support the educational goals of the cardiac rehab program.   Knowledge Questionnaire Score:   Core Components/Risk Factors/Patient Goals at Admission:     Personal Goals and Risk Factors at Admission - 07/05/16 1011      Core Components/Risk Factors/Patient Goals on Admission    Weight Management Yes;Weight Maintenance;Weight Loss   Intervention Weight Management: Develop a combined nutrition and exercise program designed to reach desired caloric intake,  while maintaining appropriate intake of nutrient and fiber, sodium and fats, and appropriate energy expenditure required for the weight goal.;Weight Management: Provide education and appropriate resources to help participant work on and attain dietary goals.;Weight Management/Obesity: Establish reasonable short term and long term weight goals.   Expected Outcomes Short Term: Continue to assess and modify interventions until short term weight is achieved;Long Term: Adherence to nutrition and physical activity/exercise program aimed toward attainment of established weight goal;Weight Maintenance: Understanding of the daily nutrition guidelines, which includes 25-35% calories from fat, 7% or less cal from saturated fats, less than 261m cholesterol, less than 1.5gm of sodium, & 5 or more servings of fruits and vegetables daily;Weight Loss: Understanding of general recommendations for a balanced deficit meal plan, which promotes 1-2 lb weight loss per week and includes a negative energy balance of [phone] kcal/d;Understanding of distribution of calorie intake throughout the day with the consumption of 4-5 meals/snacks;Understanding recommendations for meals to include 15-35% energy as protein, 25-35% energy from fat, 35-60% energy from carbohydrates, less than 2025mof dietary cholesterol, 20-35 gm of total fiber daily   Stress Yes   Intervention Offer individual and/or small group education and counseling on adjustment to heart disease, stress management and health-related lifestyle change. Teach and support self-help strategies.;Refer participants experiencing significant psychosocial distress to appropriate mental health specialists for further evaluation and treatment. When possible, include family members and significant others in education/counseling sessions.   Expected Outcomes Short Term: Participant demonstrates changes in health-related behavior, relaxation and other stress management skills, ability to  obtain effective social support, and compliance with psychotropic medications if prescribed.;Long Term: Emotional wellbeing is indicated by absence of clinically significant psychosocial distress or social isolation.   Personal Goal Other Yes   Personal Goal Learn risk factor modifications and improve knowledge on CVD   Intervention Provide cardiac education classes geared towards reducing modifiable risk factors and improving awareness on CVD's   Expected Outcomes Pt will have increase understanding of CVD's and apply risk factor modifications to lufestyle to reduce future occurrences of CVD's.      Core Components/Risk Factors/Patient Goals Review:      Goals and Risk Factor Review    Row Name 07/16/16 2302 08/12/16 2103 09/10/16 1251 10/09/16 2243       Core Components/Risk Factors/Patient Goals Review   Personal Goals Review Weight Management/Obesity;Lipids;Hypertension;Stress Weight Management/Obesity;Lipids;Hypertension;Stress Weight Management/Obesity;Lipids;Hypertension;Stress Weight Management/Obesity;Lipids;Hypertension;Stress    Review Pt is off to a great start with the completion of 3 exercise sessions.   Continue to encourage attendance to education  classess.  Continue to encourage attendance to education classess.  Continue to encourage attendance to education classess. Pt with improved bp readings at cardiac rehab. Imrpoved stress level. Making progress toward weight loss.  Continue to encourage attendance to education classess. Pt with improved bp readings at cardiac rehab. Imrpoved stress level. Making progress toward weight loss at 9 pounds presently.    Expected Outcomes Pt will maintain healthy weight, lipids and bp within normal limits and positive and healthy coping skills for stress management. Pt will maintain healthy weight, lipids and bp within normal limits and positive and healthy coping skills for stress management. Pt will maintain healthy weight, lipids and bp within  normal limits and positive and healthy coping skills for stress management. Pt will maintain healthy weight, lipids and bp within normal limits and positive and healthy coping skills for stress management.       Core Components/Risk Factors/Patient Goals at Discharge (Final Review):      Goals and Risk Factor Review - 10/09/16 2243      Core Components/Risk Factors/Patient Goals Review   Personal Goals Review Weight Management/Obesity;Lipids;Hypertension;Stress   Review  Continue to encourage attendance to education classess. Pt with improved bp readings at cardiac rehab. Imrpoved stress level. Making progress toward weight loss at 9 pounds presently.   Expected Outcomes Pt will maintain healthy weight, lipids and bp within normal limits and positive and healthy coping skills for stress management.      ITP Comments:     ITP Comments    Row Name 07/05/16 0819           ITP Comments Medical Director, Dr. Fransico Him          Comments:  Elliot is making expected progress toward personal goals after completing 22 sessions. Pt plans to graduate on this upcoming Friday. Psychosocial Assessment Pt exhibits positive coping skills, hopeful outlook with supportive family. No psychosocial needs identified at this time, no psychosocial interventions necessary Recommend continued exercise and life style modification education including  stress management and relaxation techniques to decrease cardiac risk profile. Cherre Huger, BSN Cardiac and Training and development officer

## 2016-10-10 ENCOUNTER — Encounter (HOSPITAL_COMMUNITY)
Admission: RE | Admit: 2016-10-10 | Discharge: 2016-10-10 | Disposition: A | Discharge status: Home / Self Care | Payer: 59 | Source: Ambulatory Visit | Attending: Interventional Cardiology | Admitting: Interventional Cardiology

## 2016-10-10 ENCOUNTER — Encounter (HOSPITAL_COMMUNITY): Payer: 59

## 2016-10-10 VITALS — Ht 74.0 in | Wt 216.7 lb

## 2016-10-10 DIAGNOSIS — Z955 Presence of coronary angioplasty implant and graft: Secondary | ICD-10-CM | POA: Diagnosis not present

## 2016-10-10 DIAGNOSIS — I2102 ST elevation (STEMI) myocardial infarction involving left anterior descending coronary artery: Secondary | ICD-10-CM

## 2016-10-12 ENCOUNTER — Encounter (HOSPITAL_COMMUNITY)
Admission: RE | Admit: 2016-10-12 | Discharge: 2016-10-12 | Disposition: A | Discharge status: Home / Self Care | Payer: 59 | Source: Ambulatory Visit | Attending: Interventional Cardiology | Admitting: Interventional Cardiology

## 2016-10-12 DIAGNOSIS — Z955 Presence of coronary angioplasty implant and graft: Secondary | ICD-10-CM

## 2016-10-12 DIAGNOSIS — I2102 ST elevation (STEMI) myocardial infarction involving left anterior descending coronary artery: Secondary | ICD-10-CM

## 2016-10-12 NOTE — Progress Notes (Signed)
Cardiac Individual Treatment Plan  Patient Details  Name: Tyler Fletcher MRN: [id number] Date of Birth: 08-15-1955 Referring Provider:     CARDIAC REHAB PHASE II ORIENTATION from 07/05/2016 in [place]  Referring Provider  Daneen Schick MD      Initial Encounter Date:    [place] from 07/05/2016 in [place]  Date  07/05/16  Referring Provider  Daneen Schick MD      Visit Diagnosis: 06/09/16 ST elevation myocardial infarction involving left anterior descending (LAD) coronary artery ([place])  06/12/16 Status post coronary artery stent placement  Patient's Home Medications on Admission:  Current Outpatient Prescriptions:  .  aspirin 81 MG chewable tablet, Chew 1 tablet (81 mg total) by mouth daily., Disp: , Rfl:  .  atorvastatin (LIPITOR) 20 MG tablet, Take 1 tablet (20 mg total) by mouth daily., Disp: 90 tablet, Rfl: 3 .  carvedilol (COREG) 12.5 MG tablet, Take 1 tablet (12.5 mg total) by mouth 2 (two) times daily with a meal., Disp: 180 tablet, Rfl: 3 .  clopidogrel (PLAVIX) 75 MG tablet, Take 1 tablet (75 mg total) by mouth daily with breakfast., Disp: 90 tablet, Rfl: 3 .  irbesartan (AVAPRO) 75 MG tablet, Take 0.5 tablets (37.5 mg total) by mouth daily., Disp: 45 tablet, Rfl: 3 .  loratadine (CLARITIN) 10 MG tablet, Take 10 mg by mouth daily., Disp: , Rfl:  .  nitroGLYCERIN (NITROSTAT) 0.4 MG SL tablet, Place 0.4 mg under the tongue every 5 (five) minutes as needed for chest pain., Disp: , Rfl:  .  ranitidine (ZANTAC) 150 MG tablet, Take 150 mg by mouth daily as needed for heartburn. , Disp: , Rfl:  .  tamsulosin (FLOMAX) 0.4 MG CAPS capsule, Take 0.4 mg by mouth daily., Disp: , Rfl:   Past Medical History: Past Medical History:  Diagnosis Date  . Blood pressure elevated without history of HTN 06/09/2016  . CAD (coronary artery disease), native coronary artery    06/12/16 PCI w/ DES -->mLAD  .  Hyperlipidemia     Tobacco Use: History  Smoking Status  . Never Smoker  Smokeless Tobacco  . Never Used    Labs: Recent Review Flowsheet Data    Labs for ITP Cardiac and Pulmonary Rehab Latest Ref Rng & Units 06/09/2016 06/10/2016 08/17/2016   Cholestrol 100 - 199 mg/dL 155 - 85(L)   LDLCALC 0 - 99 mg/dL 82 - 21   HDL >39 mg/dL 38(L) - 39(L)   Trlycerides 0 - 149 mg/dL 176(H) - 127   Hemoglobin A1c 4.8 - 5.6 % 5.4 5.3 -   TCO2 0 - 100 mmol/L 23 - -      Capillary Blood Glucose: No results found for: GLUCAP   Exercise Target Goals:    Exercise Program Goal: Individual exercise prescription set with THRR, safety & activity barriers. Participant demonstrates ability to understand and report RPE using BORG scale, to self-measure pulse accurately, and to acknowledge the importance of the exercise prescription.  Exercise Prescription Goal: Starting with aerobic activity 30 plus minutes a day, 3 days per week for initial exercise prescription. Provide home exercise prescription and guidelines that participant acknowledges understanding prior to discharge.  Activity Barriers & Risk Stratification:     Activity Barriers & Cardiac Risk Stratification - 07/05/16 0824      Activity Barriers & Cardiac Risk Stratification   Activity Barriers Arthritis;Other (comment);Back Problems   Comments arthritis in L knee, lower  back and R RTC   Cardiac Risk Stratification High      6 Minute Walk:     6 Minute Walk    Row Name 07/05/16 0921 07/05/16 1010 10/10/16 1620     6 Minute Walk   Phase Initial  - Discharge   Distance 1767 feet  - 2330 feet   Distance % Change  -  - 31.86 %   Distance Feet Change  -  - 563 ft   Walk Time 6 minutes  - 6 minutes   # of Rest Breaks 0  -  -   MPH 3.35  - 4.4   METS 4.1  - 5.08   RPE 9  - 11   VO2 Peak 14.4  - 17.8   Symptoms No  - No   Resting HR 68 bpm  - 79 bpm   Resting BP 132/88  - 122/70   Max Ex. HR 87 bpm  - 110 bpm   Max Ex. BP  130/86  - 142/84   2 Minute Post BP  - 110/76 114/70      Oxygen Initial Assessment:   Oxygen Re-Evaluation:   Oxygen Discharge (Final Oxygen Re-Evaluation):   Initial Exercise Prescription:     Initial Exercise Prescription - 07/05/16 0900      Date of Initial Exercise RX and Referring Provider   Date 07/05/16   Referring Provider Daneen Schick MD     Treadmill   MPH 3.2   Grade 1   Minutes 10   METs 3.89     Bike   Level 1.2   Minutes 10   METs 3.26     NuStep   Level 3   SPM 80   Minutes 10   METs 2.5     Prescription Details   Frequency (times per week) 3   Duration Progress to 30 minutes of continuous aerobic without signs/symptoms of physical distress     Intensity   THRR 40-80% of Max Heartrate 63-126   Ratings of Perceived Exertion 11-13   Perceived Dyspnea 0-4     Progression   Progression Continue to progress workloads to maintain intensity without signs/symptoms of physical distress.     Resistance Training   Training Prescription Yes   Weight 5lbs   Reps 10-15      Perform Capillary Blood Glucose checks as needed.  Exercise Prescription Changes:      Exercise Prescription Changes    Row Name 07/17/16 1200 07/27/16 1522 08/13/16 1500 08/27/16 1555 09/11/16 1500     Response to Exercise   Blood Pressure (Admit) 120/64 108/78 128/70 114/70 128/72   Blood Pressure (Exercise) 134/72 140/74 140/80 118/74 140/70   Blood Pressure (Exit) 112/80 124/62 108/80 126/68 126/80   Heart Rate (Admit) 78 bpm 74 bpm 63 bpm 72 bpm 76 bpm   Heart Rate (Exercise) 106 bpm 106 bpm 95 bpm 102 bpm 102 bpm   Heart Rate (Exit) 78 bpm 74 bpm 60 bpm 70 bpm 74 bpm   Rating of Perceived Exertion (Exercise) '11 11 11 11 11   '$ Symptoms none none none none none   Comments Pt was oriented to exercise equipment and responded well to exercise  - -  -  -   Duration Continue with 30 min of aerobic exercise without signs/symptoms of physical distress. Continue with 30  min of aerobic exercise without signs/symptoms of physical distress. Continue with 30 min of aerobic exercise without signs/symptoms of physical distress. Continue with  30 min of aerobic exercise without signs/symptoms of physical distress. Continue with 30 min of aerobic exercise without signs/symptoms of physical distress.   Intensity THRR unchanged THRR unchanged THRR unchanged THRR unchanged THRR unchanged     Progression   Progression Continue to progress workloads to maintain intensity without signs/symptoms of physical distress. Continue to progress workloads to maintain intensity without signs/symptoms of physical distress. Continue to progress workloads to maintain intensity without signs/symptoms of physical distress. Continue to progress workloads to maintain intensity without signs/symptoms of physical distress. Continue to progress workloads to maintain intensity without signs/symptoms of physical distress.   Average METs 3.2 3.5 3.6 4.7 4.5     Resistance Training   Training Prescription Yes Yes Yes Yes Yes   Weight 4lbs 5lbs 5lbs 5lbs 5lbs   Reps 10-15 10-15 10-15 10-15 10-15   Time 10 Minutes 10 Minutes 10 Minutes 10 Minutes 10 Minutes     Treadmill   MPH 3.2 3.2 3.2 3.2 3.2   Grade '1 1 1 1 1   '$ Minutes '10 10 10 10 10   '$ METs 3.89 3.89 3.89 3.89 3.89     Bike   Level 1.2 1.2 1.2 1.7 1.7   Minutes '10 10 10 10 10   '$ METs 3.26 3.31 3.26 4.25 4.25     NuStep   Level '3 5 5  '$ -  -   SPM 80 90 90  -  -   Minutes '10 10 10  '$ -  -   METs 2.5 3.2 3.6  -  -     Rower   Level  -  -  - 3 3   Watts  -  -  - 60 45   Minutes  -  -  - 10 10   METs  -  -  - 6 5.5     Home Exercise Plan   Plans to continue exercise at  -  -  - Home (comment)  walking and small handheld weights Home (comment)  walking and small handheld weights   Frequency  -  -  - Add 2 additional days to program exercise sessions. Add 2 additional days to program exercise sessions.   Initial Home Exercises Provided   -  -  - 08/27/16 08/27/16   Row Name 09/26/16 1606 10/04/16 1600 10/12/16 1500         Response to Exercise   Blood Pressure (Admit) 118/64 130/70 124/70     Blood Pressure (Exercise) 148/62 134/70 132/80     Blood Pressure (Exit) 134/70 120/68 116/74     Heart Rate (Admit) 89 bpm 73 bpm 81 bpm     Heart Rate (Exercise) 111 bpm 102 bpm 104 bpm     Heart Rate (Exit) 89 bpm 72 bpm 74 bpm     Rating of Perceived Exertion (Exercise) '11 11 11     '$ Symptoms none none none     Duration Continue with 30 min of aerobic exercise without signs/symptoms of physical distress. Continue with 30 min of aerobic exercise without signs/symptoms of physical distress. Continue with 30 min of aerobic exercise without signs/symptoms of physical distress.     Intensity THRR unchanged THRR unchanged THRR unchanged       Progression   Progression Continue to progress workloads to maintain intensity without signs/symptoms of physical distress. Continue to progress workloads to maintain intensity without signs/symptoms of physical distress. Continue to progress workloads to maintain intensity without signs/symptoms of physical distress.  Average METs 4.9 5.1 5       Resistance Training   Training Prescription Yes Yes Yes     Weight 5lbs 5lbs 5lbs     Reps 10-15 10-15 10-15     Time 10 Minutes 10 Minutes 10 Minutes       Treadmill   MPH 3.5 3.5 3.5     Grade '3 3 3     '$ Minutes '10 10 10     '$ METs 5.13 5.13 5.13       Bike   Level 1.7 1.7 1.7     Minutes '10 10 10     '$ METs 4.27 4.27 4.27       Rower   Level '3 3 3     '$ Watts 50 70 55     Minutes '10 10 10     '$ METs 5.5 6 5.5       Home Exercise Plan   Plans to continue exercise at Home (comment)  walking and small handhld weights Home (comment)  walking and small handhld weights Home (comment)  walking and small handhld weights     Frequency Add 2 additional days to program exercise sessions. Add 2 additional days to program exercise sessions. Add 2  additional days to program exercise sessions.     Initial Home Exercises Provided 08/27/16 08/27/16 08/27/16        Exercise Comments:      Exercise Comments    Row Name 07/17/16 1226 08/13/16 1526 09/10/16 1630 10/04/16 1609 10/12/16 1514   Exercise Comments Pt is responding well to exercise in cardiac rehab. Will continue to f/u with pt's activty levels and exercise progression Reviewed METs and goals. Pt is tolerating exercise very well; will continue to monitor pt's progress. Reviewed METs and goals. Pt is tolerating exercise very well; will continue to monitor pt's progress. Reviewed METs and goals. Pt is tolerating exercise very well; will continue to monitor pt's progress. Pt completed 24 sessions of cardiac rehab. Pt plans to [facility] his exercise program at [facility]. 3x/week and walk on non gym days for 30 minutes. Discussed temperauture/emergency precautions. Pt voiced understanding.      Exercise Goals and Review:      Exercise Goals    Row Name 07/05/16 0825             Exercise Goals   Increase Physical Activity Yes       Intervention Provide advice, education, support and counseling about physical activity/exercise needs.;Develop an individualized exercise prescription for aerobic and resistive training based on initial evaluation findings, risk stratification, comorbidities and participant's personal goals.       Expected Outcomes Achievement of increased cardiorespiratory fitness and enhanced flexibility, muscular endurance and strength shown through measurements of functional capacity and personal statement of participant.       Increase Strength and Stamina Yes  learn exercise limitations and learn risk factors       Intervention Provide advice, education, support and counseling about physical activity/exercise needs.;Develop an individualized exercise prescription for aerobic and resistive training based on initial evaluation findings, risk stratification,  comorbidities and participant's personal goals.       Expected Outcomes Achievement of increased cardiorespiratory fitness and enhanced flexibility, muscular endurance and strength shown through measurements of functional capacity and personal statement of participant.          Exercise Goals Re-Evaluation :     Exercise Goals Re-Evaluation    Row Name 08/13/16 1525 08/13/16 1526 09/10/16 1629 10/04/16  1609       Exercise Goal Re-Evaluation   Exercise Goals Review Increase Physical Activity;Increase Strenth and Stamina  - Increase Physical Activity;Increase Strenth and Stamina Increase Physical Activity;Able to understand and use rate of perceived exertion (RPE) scale;Knowledge and understanding of Target Heart Rate Range (THRR);Understanding of Exercise Prescription;Able to check pulse independently;Increase Strength and Stamina    Comments Pt is tolerating exercise very well. Pt MET level on stepper has increased from  Pt is tolerating exercise very well. Pt MET level on stepper has increased from 2.5 to 3.6 Pt is walking and doing small handheld weights for exercise at home. Pt has noticed an increase in strength and stamina since starting the program. Pt is making great progess in cardiac rehab and handles WL increases very well. Pt is now using the rower machine for core and UE strength and is currently averaging 70 watts.    Expected Outcomes  - Pt will continue to improve in cardiorespiratory fitness. Pt will continue to improve in cardiorespiratory fitness. Pt will continue to improve in cardiorespiratory fitness and muscular strength/endurance        Discharge Exercise Prescription (Final Exercise Prescription Changes):     Exercise Prescription Changes - 10/12/16 1500      Response to Exercise   Blood Pressure (Admit) 124/70   Blood Pressure (Exercise) 132/80   Blood Pressure (Exit) 116/74   Heart Rate (Admit) 81 bpm   Heart Rate (Exercise) 104 bpm   Heart Rate (Exit) 74 bpm    Rating of Perceived Exertion (Exercise) 11   Symptoms none   Duration Continue with 30 min of aerobic exercise without signs/symptoms of physical distress.   Intensity THRR unchanged     Progression   Progression Continue to progress workloads to maintain intensity without signs/symptoms of physical distress.   Average METs 5     Resistance Training   Training Prescription Yes   Weight 5lbs   Reps 10-15   Time 10 Minutes     Treadmill   MPH 3.5   Grade 3   Minutes 10   METs 5.13     Bike   Level 1.7   Minutes 10   METs 4.27     Rower   Level 3   Watts 55   Minutes 10   METs 5.5     Home Exercise Plan   Plans to continue exercise at Home (comment)  walking and small handhld weights   Frequency Add 2 additional days to program exercise sessions.   Initial Home Exercises Provided 08/27/16      Nutrition:  Target Goals: Understanding of nutrition guidelines, daily intake of sodium '1500mg'$ , cholesterol '200mg'$ , calories 30% from fat and 7% or less from saturated fats, daily to have 5 or more servings of fruits and vegetables.  Biometrics:     Pre Biometrics - 10/10/16 1621      Pre Biometrics   Height '6\' 2"'$  (1.88 m)   Weight 216 lb 11.4 oz (98.3 kg)   Waist Circumference 44.25 inches   Hip Circumference 43.5 inches   Waist to Hip Ratio 1.02 %   BMI (Calculated) 27.81   Triceps Skinfold 18 mm   % Body Fat 29.6 %   Grip Strength 40 kg   Flexibility 16 in   Single Leg Stand 30 seconds       Nutrition Therapy Plan and Nutrition Goals:     Nutrition Therapy & Goals - 08/13/16 1215      Nutrition  Therapy   Diet Therapeutic Lifestyle Changes     Personal Nutrition Goals   Nutrition Goal Wt loss of 1-2 lb/week to a wt loss goal of 5 lb at graduation from Cardiac Rehab     Intervention Plan   Intervention Prescribe, educate and counsel regarding individualized specific dietary modifications aiming towards targeted core components such as weight,  hypertension, lipid management, diabetes, heart failure and other comorbidities.   Expected Outcomes Short Term Goal: Understand basic principles of dietary content, such as calories, fat, sodium, cholesterol and nutrients.;Long Term Goal: Adherence to prescribed nutrition plan.      Nutrition Discharge: Nutrition Scores:     Nutrition Assessments - 10/15/16 1215      MEDFICTS Scores   Pre Score 3   Post Score 6   Score Difference 3      Nutrition Goals Re-Evaluation:     Nutrition Goals Re-Evaluation    Row Name 10/19/16 1140             Goals   Current Weight 217 lb 2.2 oz (98.5 kg)       Nutrition Goal Wt loss of 1-2 lb/week to a wt loss goal of 5 lb at graduation from Cardiac Rehab       Comment Pt maintained his wt while in Cardiac Rehab. Wt loss goal not met.           Nutrition Goals Re-Evaluation:     Nutrition Goals Re-Evaluation    Row Name 10/19/16 1140             Goals   Current Weight 217 lb 2.2 oz (98.5 kg)       Nutrition Goal Wt loss of 1-2 lb/week to a wt loss goal of 5 lb at graduation from Cardiac Rehab       Comment Pt maintained his wt while in Cardiac Rehab. Wt loss goal not met.           Nutrition Goals Discharge (Final Nutrition Goals Re-Evaluation):     Nutrition Goals Re-Evaluation - 10/19/16 1140      Goals   Current Weight 217 lb 2.2 oz (98.5 kg)   Nutrition Goal Wt loss of 1-2 lb/week to a wt loss goal of 5 lb at graduation from Cardiac Rehab   Comment Pt maintained his wt while in Cardiac Rehab. Wt loss goal not met.       Psychosocial: Target Goals: Acknowledge presence or absence of significant depression and/or stress, maximize coping skills, provide positive support system. Participant is able to verbalize types and ability to use techniques and skills needed for reducing stress and depression.  Initial Review & Psychosocial Screening:     Initial Psych Review & Screening - 07/05/16 1204      Initial Review    Current issues with Current Stress Concerns   Source of Stress Concerns Family   Comments Pt has 49 y o son; Father paseed away on the day of pt cardiac event/Stemi     Family Dynamics   Good Support System? Yes   Concerns Recent loss of significant other   Comments father passed away - lived in [facility]     Barriers   Psychosocial barriers to participate in program The patient should benefit from training in stress management and relaxation.      Quality of Life Scores:     Quality of Life - 10/10/16 1623      Quality of Life Scores   Health/Function Pre 27.6 %  Health/Function Post 30 %   Health/Function % Change 8.7 %   Socioeconomic Pre 29.14 %   Socioeconomic Post 30 %   Socioeconomic % Change  2.95 %   Psych/Spiritual Pre 25.71 %   Psych/Spiritual Post 30 %   Psych/Spiritual % Change 16.69 %   Family Pre 30 %   Family Post 30 %   Family % Change 0 %   GLOBAL Pre 27.88 %   GLOBAL Post 30 %   GLOBAL % Change 7.6 %      PHQ-9: Recent Review Flowsheet Data    Depression screen [facility] 2/9 07/11/2016   Decreased Interest 0   Down, Depressed, Hopeless 0   PHQ - 2 Score 0     Interpretation of Total Score  Total Score Depression Severity:  1-4 = Minimal depression, 5-9 = Mild depression, 10-14 = Moderate depression, 15-19 = Moderately severe depression, 20-27 = Severe depression   Psychosocial Evaluation and Intervention:     Psychosocial Evaluation - 10/09/16 2245      Psychosocial Evaluation & Interventions   Interventions Stress management education;Relaxation education   Comments Pt with supportive family, feels good about life.   Continue Psychosocial Services  No Follow up required      Psychosocial Re-Evaluation:     Psychosocial Re-Evaluation    [place] Name 08/12/16 2105 09/10/16 1253 10/09/16 2244 10/12/16 1235       Psychosocial Re-Evaluation   Current issues with None Identified None Identified None Identified None Identified    Expected Outcomes  -   -  - Pt graduates from cardiac rehab. Pt has no Psychosocial needs, no further intervention    Interventions Stress management education;Encouraged to attend Cardiac Rehabilitation for the exercise;Relaxation education Stress management education;Encouraged to attend Cardiac Rehabilitation for the exercise;Relaxation education Encouraged to attend Cardiac Rehabilitation for the exercise;Relaxation education;Stress management education  -    Continue Psychosocial Services  Follow up required by staff No Follow up required No Follow up required No Follow up required       Psychosocial Discharge (Final Psychosocial Re-Evaluation):     Psychosocial Re-Evaluation - 10/12/16 1235      Psychosocial Re-Evaluation   Current issues with None Identified   Expected Outcomes Pt graduates from cardiac rehab. Pt has no Psychosocial needs, no further intervention   Continue Psychosocial Services  No Follow up required      Vocational Rehabilitation: Provide vocational rehab assistance to qualifying candidates.   Vocational Rehab Evaluation & Intervention:   Education: Education Goals: Education classes will be provided on a weekly basis, covering required topics. Participant will state understanding/return demonstration of topics presented.  Learning Barriers/Preferences:     Learning Barriers/Preferences - 07/05/16 4403      Learning Barriers/Preferences   Learning Barriers Sight   Learning Preferences Skilled Demonstration      Education Topics: Count Your Pulse:  -Group instruction provided by verbal instruction, demonstration, patient participation and written materials to support subject.  Instructors address importance of being able to find your pulse and how to count your pulse when at home without a heart monitor.  Patients get hands on experience counting their pulse with staff help and individually.   CARDIAC REHAB PHASE II EXERCISE from 10/03/2016 in [place]  Date  08/03/16  Instruction Review Code  2- meets goals/outcomes      Heart Attack, Angina, and Risk Factor Modification:  -Group instruction provided by verbal instruction, video, and written materials to  support subject.  Instructors address signs and symptoms of angina and heart attacks.    Also discuss risk factors for heart disease and how to make changes to improve heart health risk factors.   CARDIAC REHAB PHASE II EXERCISE from 10/03/2016 in [place]  Date  08/22/16  Instruction Review Code  2- meets goals/outcomes      Functional Fitness:  -Group instruction provided by verbal instruction, demonstration, patient participation, and written materials to support subject.  Instructors address safety measures for doing things around the house.  Discuss how to get up and down off the floor, how to pick things up properly, how to safely get out of a chair without assistance, and balance training.   CARDIAC REHAB PHASE II EXERCISE from 10/03/2016 in [place]  Date  08/17/16  Educator  ep  Instruction Review Code  2- meets goals/outcomes      Meditation and Mindfulness:  -Group instruction provided by verbal instruction, patient participation, and written materials to support subject.  Instructor addresses importance of mindfulness and meditation practice to help reduce stress and improve awareness.  Instructor also leads participants through a meditation exercise.    Stretching for Flexibility and Mobility:  -Group instruction provided by verbal instruction, patient participation, and written materials to support subject.  Instructors lead participants through series of stretches that are designed to increase flexibility thus improving mobility.  These stretches are additional exercise for major muscle groups that are typically performed during regular warm up and cool down.   CARDIAC REHAB PHASE II EXERCISE  from 10/03/2016 in [place]  Date  07/20/16  Instruction Review Code  2- meets goals/outcomes      Hands Only CPR:  -Group verbal, video, and participation provides a basic overview of AHA guidelines for community CPR. Role-play of emergencies allow participants the opportunity to practice calling for help and chest compression technique with discussion of AED use.   Hypertension: -Group verbal and written instruction that provides a basic overview of hypertension including the most recent diagnostic guidelines, risk factor reduction with self-care instructions and medication management.   CARDIAC REHAB PHASE II EXERCISE from 10/03/2016 in [place]  Date  09/07/16  Instruction Review Code  2- meets goals/outcomes       Nutrition I class: Heart Healthy Eating:  -Group instruction provided by PowerPoint slides, verbal discussion, and written materials to support subject matter. The instructor gives an explanation and review of the Therapeutic Lifestyle Changes diet recommendations, which includes a discussion on lipid goals, dietary fat, sodium, fiber, plant stanol/sterol esters, sugar, and the components of a well-balanced, healthy diet.   CARDIAC REHAB PHASE II EXERCISE from 10/03/2016 in [place]  Date  08/13/16  Educator  RD  Instruction Review Code  Not applicable      Nutrition II class: Lifestyle Skills:  -Group instruction provided by PowerPoint slides, verbal discussion, and written materials to support subject matter. The instructor gives an explanation and review of label reading, grocery shopping for heart health, heart healthy recipe modifications, and ways to make healthier choices when eating out.   CARDIAC REHAB PHASE II EXERCISE from 10/03/2016 in [place]  Date  08/13/16  Educator  RD  Instruction Review Code  Not applicable      Diabetes  Question & Answer:  -Group instruction provided by PowerPoint slides, verbal discussion, and  written materials to support subject matter. The instructor gives an explanation and review of diabetes co-morbidities, pre- and post-prandial blood glucose goals, pre-exercise blood glucose goals, signs, symptoms, and treatment of hypoglycemia and hyperglycemia, and foot care basics.   Diabetes Blitz:  -Group instruction provided by PowerPoint slides, verbal discussion, and written materials to support subject matter. The instructor gives an explanation and review of the physiology behind type 1 and type 2 diabetes, diabetes medications and rational behind using different medications, pre- and post-prandial blood glucose recommendations and Hemoglobin A1c goals, diabetes diet, and exercise including blood glucose guidelines for exercising safely.    Portion Distortion:  -Group instruction provided by PowerPoint slides, verbal discussion, written materials, and food models to support subject matter. The instructor gives an explanation of serving size versus portion size, changes in portions sizes over the last 20 years, and what consists of a serving from each food group.   CARDIAC REHAB PHASE II EXERCISE from 10/03/2016 in [place]  Date  09/19/16  Educator  RD  Instruction Review Code  2- meets goals/outcomes      Stress Management:  -Group instruction provided by verbal instruction, video, and written materials to support subject matter.  Instructors review role of stress in heart disease and how to cope with stress positively.     CARDIAC REHAB PHASE II EXERCISE from 10/03/2016 in [place]  Date  09/26/16  Instruction Review Code  2- meets goals/outcomes      Exercising on Your Own:  -Group instruction provided by verbal instruction, power point, and written materials to support subject.  Instructors discuss benefits of exercise,  components of exercise, frequency and intensity of exercise, and end points for exercise.  Also discuss use of nitroglycerin and activating EMS.  Review options of places to exercise outside of rehab.  Review guidelines for sex with heart disease.   CARDIAC REHAB PHASE II EXERCISE from 10/03/2016 in [place]  Date  10/03/16  Instruction Review Code  2- meets goals/outcomes      Cardiac Drugs I:  -Group instruction provided by verbal instruction and written materials to support subject.  Instructor reviews cardiac drug classes: antiplatelets, anticoagulants, beta blockers, and statins.  Instructor discusses reasons, side effects, and lifestyle considerations for each drug class.   Cardiac Drugs II:  -Group instruction provided by verbal instruction and written materials to support subject.  Instructor reviews cardiac drug classes: angiotensin converting enzyme inhibitors (ACE-I), angiotensin II receptor blockers (ARBs), nitrates, and calcium channel blockers.  Instructor discusses reasons, side effects, and lifestyle considerations for each drug class.   CARDIAC REHAB PHASE II EXERCISE from 10/03/2016 in [place]  Date  07/11/16  Instruction Review Code  2- meets goals/outcomes      Anatomy and Physiology of the Circulatory System:  Group verbal and written instruction and models provide basic cardiac anatomy and physiology, with the coronary electrical and arterial systems. Review of: AMI, Angina, Valve disease, Heart Failure, Peripheral Artery Disease, Cardiac Arrhythmia, Pacemakers, and the ICD.   Other Education:  -Group or individual verbal, written, or video instructions that support the educational goals of the cardiac rehab program.   Knowledge Questionnaire Score:     Knowledge Questionnaire Score - 10/10/16 1622      Knowledge Questionnaire Score   Pre Score 22/24      Core Components/Risk Factors/Patient  Goals at Admission:     Personal  Goals and Risk Factors at Admission - 10/12/16 1522      Core Components/Risk Factors/Patient Goals on Admission   Personal Goal Pt feels as though he has an increase understanding/awareness of CVD and knows exercise/activity limitations. Pt has met personal and program goals.   Expected Outcomes Pt will continue to live a heart healthy lifestyle to reduce modifiable risks factors and future occurrences.      Core Components/Risk Factors/Patient Goals Review:      Goals and Risk Factor Review    Row Name 07/16/16 2302 08/12/16 2103 09/10/16 1251 10/09/16 2243       Core Components/Risk Factors/Patient Goals Review   Personal Goals Review Weight Management/Obesity;Lipids;Hypertension;Stress Weight Management/Obesity;Lipids;Hypertension;Stress Weight Management/Obesity;Lipids;Hypertension;Stress Weight Management/Obesity;Lipids;Hypertension;Stress    Review Pt is off to a great start with the completion of 3 exercise sessions.   Continue to encourage attendance to education classess.  Continue to encourage attendance to education classess.  Continue to encourage attendance to education classess. Pt with improved bp readings at cardiac rehab. Imrpoved stress level. Making progress toward weight loss.  Continue to encourage attendance to education classess. Pt with improved bp readings at cardiac rehab. Imrpoved stress level. Making progress toward weight loss at 9 pounds presently.    Expected Outcomes Pt will maintain healthy weight, lipids and bp within normal limits and positive and healthy coping skills for stress management. Pt will maintain healthy weight, lipids and bp within normal limits and positive and healthy coping skills for stress management. Pt will maintain healthy weight, lipids and bp within normal limits and positive and healthy coping skills for stress management. Pt will maintain healthy weight, lipids and bp within normal limits and positive  and healthy coping skills for stress management.       Core Components/Risk Factors/Patient Goals at Discharge (Final Review):      Goals and Risk Factor Review - 10/09/16 2243      Core Components/Risk Factors/Patient Goals Review   Personal Goals Review Weight Management/Obesity;Lipids;Hypertension;Stress   Review  Continue to encourage attendance to education classess. Pt with improved bp readings at cardiac rehab. Imrpoved stress level. Making progress toward weight loss at 9 pounds presently.   Expected Outcomes Pt will maintain healthy weight, lipids and bp within normal limits and positive and healthy coping skills for stress management.      ITP Comments:     ITP Comments    Row Name 07/05/16 0819           ITP Comments Medical Director, Dr. Armanda Magic          Comments:  Pt graduated from cardiac rehab program today with completion of 24 exercise sessions in Phase II. Pt maintained good attendance and progressed nicely during his participation in rehab as evidenced by increased MET level.   Medication list reconciled. Repeat  PHQ score- 0. Pt completed post assessments AOL survey. Pt scored the following     Quality of Life - 10/10/16 1623      Quality of Life Scores   Health/Function Pre 27.6 %   Health/Function Post 30 %   Health/Function % Change 8.7 %   Socioeconomic Pre 29.14 %   Socioeconomic Post 30 %   Socioeconomic % Change  2.95 %   Psych/Spiritual Pre 25.71 %   Psych/Spiritual Post 30 %   Psych/Spiritual % Change 16.69 %   Family Pre 30 %   Family Post 30 %   Family % Change 0 %   GLOBAL Pre 27.88 %  GLOBAL Post 30 %   GLOBAL % Change 7.6 %      Pt has made significant lifestyle changes and should be commended for his success. Pt feels he has achieved his goals during cardiac rehab. Pt feels he has learn his risk factors by attending education classes.  Pt understands his exercise limitation.   Pt plans to continue exercise at the [facility] 3-4 times a week.  Pt plans to incorporate light weights along with exercising on similar equipment here at cardiac rehab, It was a pleasure to have this delightful patient in our cardiac rehab. Cherre Huger, BSN Cardiac and Training and development officer

## 2016-10-15 ENCOUNTER — Encounter (HOSPITAL_COMMUNITY): Payer: 59

## 2016-10-17 ENCOUNTER — Other Ambulatory Visit: Payer: BLUE CROSS/BLUE SHIELD

## 2016-10-17 ENCOUNTER — Encounter (HOSPITAL_COMMUNITY): Payer: 59

## 2016-10-22 ENCOUNTER — Encounter (HOSPITAL_COMMUNITY): Payer: 59

## 2016-10-23 ENCOUNTER — Encounter (HOSPITAL_COMMUNITY): Payer: Self-pay | Admitting: *Deleted

## 2016-10-24 ENCOUNTER — Encounter (HOSPITAL_COMMUNITY): Payer: 59

## 2016-10-25 ENCOUNTER — Encounter (INDEPENDENT_AMBULATORY_CARE_PROVIDER_SITE_OTHER): Payer: Self-pay

## 2016-10-25 ENCOUNTER — Other Ambulatory Visit: Payer: 59 | Admitting: *Deleted

## 2016-10-25 DIAGNOSIS — E785 Hyperlipidemia, unspecified: Secondary | ICD-10-CM | POA: Diagnosis not present

## 2016-10-25 LAB — HEPATIC FUNCTION PANEL
ALBUMIN: 4.7 g/dL (ref 3.6–4.8)
ALK PHOS: 58 IU/L (ref 39–117)
ALT: 24 IU/L (ref 0–44)
AST: 21 IU/L (ref 0–40)
BILIRUBIN TOTAL: 1.4 mg/dL — AB (ref 0.0–1.2)
Bilirubin, Direct: 0.39 mg/dL (ref 0.00–0.40)
TOTAL PROTEIN: 6.7 g/dL (ref 6.0–8.5)

## 2016-10-25 LAB — LIPID PANEL
CHOLESTEROL TOTAL: 102 mg/dL (ref 100–199)
Chol/HDL Ratio: 2.4 ratio (ref 0.0–5.0)
HDL: 43 mg/dL (ref >39)
LDL Calculated: 19 mg/dL (ref 0–99)
Triglycerides: 198 mg/dL — ABNORMAL HIGH (ref 0–149)
VLDL Cholesterol Cal: 40 mg/dL (ref 5–40)

## 2016-10-29 ENCOUNTER — Encounter (HOSPITAL_COMMUNITY): Payer: 59

## 2016-10-31 ENCOUNTER — Encounter (HOSPITAL_COMMUNITY): Payer: 59

## 2016-11-05 ENCOUNTER — Encounter (HOSPITAL_COMMUNITY): Payer: 59

## 2016-11-07 ENCOUNTER — Encounter (HOSPITAL_COMMUNITY): Payer: 59

## 2016-11-12 ENCOUNTER — Encounter (HOSPITAL_COMMUNITY): Payer: 59

## 2016-11-14 ENCOUNTER — Encounter (HOSPITAL_COMMUNITY): Payer: 59

## 2016-11-19 ENCOUNTER — Encounter (HOSPITAL_COMMUNITY): Payer: 59

## 2016-11-21 ENCOUNTER — Encounter (HOSPITAL_COMMUNITY): Payer: 59

## 2016-11-23 ENCOUNTER — Other Ambulatory Visit: Payer: Self-pay

## 2016-11-23 MED ORDER — ATORVASTATIN CALCIUM 20 MG PO TABS
20.0000 mg | ORAL_TABLET | Freq: Every day | ORAL | 2 refills | Status: DC
Start: 2016-11-23 — End: 2016-11-27

## 2016-11-26 ENCOUNTER — Encounter (HOSPITAL_COMMUNITY): Payer: 59

## 2016-11-27 MED ORDER — ATORVASTATIN CALCIUM 20 MG PO TABS: 20.0000 mg | ORAL_TABLET | Freq: Every day | ORAL | 2 refills | Status: DC

## 2016-11-27 NOTE — Addendum Note (Signed)
Addended by: Demetrios LollBARNARD, CATHY C on: 11/27/2016 04:37 PM   Modules accepted: Orders

## 2017-01-16 DIAGNOSIS — Z125 Encounter for screening for malignant neoplasm of prostate: Secondary | ICD-10-CM | POA: Diagnosis not present

## 2017-01-16 DIAGNOSIS — Z0000 Encounter for general adult medical examination without abnormal findings: Secondary | ICD-10-CM | POA: Diagnosis not present

## 2017-01-16 DIAGNOSIS — E782 Mixed hyperlipidemia: Secondary | ICD-10-CM | POA: Diagnosis not present

## 2017-01-16 DIAGNOSIS — Z1211 Encounter for screening for malignant neoplasm of colon: Secondary | ICD-10-CM | POA: Diagnosis not present

## 2017-02-01 DIAGNOSIS — R739 Hyperglycemia, unspecified: Secondary | ICD-10-CM | POA: Diagnosis not present

## 2017-05-12 ENCOUNTER — Other Ambulatory Visit: Payer: Self-pay | Admitting: Cardiology

## 2017-05-14 NOTE — Telephone Encounter (Signed)
Outpatient Medication Detail    Disp Refills Start End   carvedilol (COREG) 12.5 MG tablet 180 tablet 3 07/03/2016    Sig - Route: Take 1 tablet (12.5 mg total) by mouth 2 (two) times daily with a meal. - Oral   Sent to pharmacy as: carvedilol (COREG) 12.5 MG tablet   E-Prescribing Status: Receipt confirmed by pharmacy (07/03/2016 11:55 AM EDT)   Pharmacy   [facility] - [place], [place] - [address]

## 2017-05-16 ENCOUNTER — Other Ambulatory Visit: Payer: Self-pay | Admitting: Interventional Cardiology

## 2017-05-16 MED ORDER — CARVEDILOL 12.5 MG PO TABS: 12.5000 mg | ORAL_TABLET | Freq: Two times a day (BID) | ORAL | 0 refills | Status: DC

## 2017-05-16 NOTE — Telephone Encounter (Signed)
°*  STAT* If patient is at the pharmacy, call can be transferred to refill team.   1. Which medications need to be refilled? (please list name of each medication and dose if known) Carvedilol 12.5 mg  2. Which pharmacy/location (including street and city if local pharmacy) is medication to be sent to? [id number] - [place],  - [address]  3. Do they need a 30 day or 90 day supply? 30

## 2017-05-16 NOTE — Telephone Encounter (Signed)
Pt's medication was sent to pt's pharmacy as requested. Confirmation received.  °

## 2017-06-19 ENCOUNTER — Other Ambulatory Visit: Payer: Self-pay | Admitting: Cardiology

## 2017-06-20 ENCOUNTER — Other Ambulatory Visit: Payer: Self-pay | Admitting: Cardiology

## 2017-06-27 NOTE — Progress Notes (Signed)
Cardiology Office Note    Date:  06/28/2017   ID:  Tyler Fletcher, DOB 1955/08/06, MRN [id number]  PCP:  Asencion Gowda.August Saucer, MD  Cardiologist: Lesleigh Noe, MD   Chief Complaint  Patient presents with  . Coronary Artery Disease    History of Present Illness:  Tyler Fletcher is a 62 y.o. male with recent acute coronary syndrome, aborted LAD ST elevation myocardial infarction, and treated with drug-eluting stent during the acute episode 06/09/2016.  He has no cardiac symptoms.  Denies dyspnea.  No orthopnea, PND, edema, orthopnea.  Has not had chest pain.  No medication side effects.  Has been trying to take Lipitor, quarter tablet daily but pills have been fracturing.   Past Medical History:  Diagnosis Date  . Blood pressure elevated without history of HTN 06/09/2016  . CAD (coronary artery disease), native coronary artery    06/12/16 PCI w/ DES -->mLAD  . Hyperlipidemia     Past Surgical History:  Procedure Laterality Date  . CORONARY STENT INTERVENTION N/A 06/12/2016   Procedure: Coronary Stent Intervention;  Surgeon: Lyn Records, MD;  Location: [facility] INVASIVE CV LAB;  Service: Cardiovascular;  Laterality: N/A;  . LEFT HEART CATH AND CORONARY ANGIOGRAPHY N/A 06/09/2016   Procedure: Left Heart Cath and Coronary Angiography;  Surgeon: Lyn Records, MD;  Location: [facility] INVASIVE CV LAB;  Service: Cardiovascular;  Laterality: N/A;  . None      Current Medications: Outpatient Medications Prior to Visit  Medication Sig Dispense Refill  . aspirin 81 MG chewable tablet Chew 1 tablet (81 mg total) by mouth daily.    . carvedilol (COREG) 12.5 MG tablet Take 1 tablet (12.5 mg total) by mouth 2 (two) times daily with a meal. Please keep upcoming appt for future refills. Thank you 180 tablet 0  . loratadine (CLARITIN) 10 MG tablet Take 10 mg by mouth daily.    . nitroGLYCERIN (NITROSTAT) 0.4 MG SL tablet Place 0.4 mg under the tongue every 5 (five) minutes as needed for chest pain.      . ranitidine (ZANTAC) 150 MG tablet Take 150 mg by mouth daily as needed for heartburn.     . tamsulosin (FLOMAX) 0.4 MG CAPS capsule Take 0.4 mg by mouth daily.    . clopidogrel (PLAVIX) 75 MG tablet Take 1 tablet (75 mg total) by mouth daily with breakfast. 90 tablet 0  . irbesartan (AVAPRO) 75 MG tablet Take 0.5 tablets (37.5 mg total) by mouth daily. Please keep upcoming appt for future refills. Thank you 15 tablet 1  . atorvastatin (LIPITOR) 20 MG tablet Take 1 tablet (20 mg total) by mouth daily. 90 tablet 2  . clopidogrel (PLAVIX) 75 MG tablet Take 1 tablet (75 mg total) by mouth daily with breakfast. (Patient not taking: Reported on 06/28/2017) 90 tablet 3   No facility-administered medications prior to visit.      Allergies:   Patient has no known allergies.   Social History   Socioeconomic History  . Marital status: Married    Spouse name: Not on file  . Number of children: 2  . Years of education: Not on file  . Highest education level: Not on file  Occupational History  . Occupation: Unemployed  Social Needs  . Financial resource strain: Not on file  . Food insecurity:    Worry: Not on file    Inability: Not on file  . Transportation needs:    Medical: Not on file  Non-medical: Not on file  Tobacco Use  . Smoking status: Never Smoker  . Smokeless tobacco: Never Used  Substance and Sexual Activity  . Alcohol use: No  . Drug use: No  . Sexual activity: Not on file  Lifestyle  . Physical activity:    Days per week: Not on file    Minutes per session: Not on file  . Stress: Not on file  Relationships  . Social connections:    Talks on phone: Not on file    Gets together: Not on file    Attends religious service: Not on file    Active member of club or organization: Not on file    Attends meetings of clubs or organizations: Not on file    Relationship status: Not on file  Other Topics Concern  . Not on file  Social History Narrative  . Not on file      Family History:  The patient's family history includes Heart attack in his father; Heart disease in his father; Hypertension in his mother.   ROS:   Please see the history of present illness.    None All other systems reviewed and are negative.   PHYSICAL EXAM:   VS:  BP 102/68   Pulse 77   Ht  (1.905 m)   Wt 224 lb 3.2 oz (101.7 kg)   BMI 28.02 kg/m    GEN: Well nourished, well developed, in no acute distress  HEENT: normal  Neck: no JVD, carotid bruits, or masses Cardiac: RRR; no murmurs, rubs, or gallops,no edema  Respiratory:  clear to auscultation bilaterally, normal work of breathing GI: soft, nontender, nondistended, + BS MS: no deformity or atrophy  Skin: warm and dry, no rash Neuro:  Alert and Oriented x 3, Strength and sensation are intact Psych: euthymic mood, full affect  Wt Readings from Last 3 Encounters:  06/28/17 224 lb 3.2 oz (101.7 kg)  10/10/16 216 lb 11.4 oz (98.3 kg)  08/03/16 220 lb (99.8 kg)      Studies/Labs Reviewed:   EKG:  EKG sinus rhythm, low voltage, nonspecific T wave flattening.  Overall relatively normal-appearing tracing.  Recent Labs: 10/25/2016: ALT 24   Lipid Panel    Component Value Date/Time   CHOL 102 10/25/2016 0950   TRIG 198 (H) 10/25/2016 0950   HDL 43 10/25/2016 0950   CHOLHDL 2.4 10/25/2016 0950   CHOLHDL 4.1 06/09/2016 0930   VLDL 35 06/09/2016 0930   LDLCALC 19 10/25/2016 0950    Additional studies/ records that were reviewed today include:  none    ASSESSMENT:    1. Coronary artery disease involving native coronary artery of native heart with angina pectoris (HCC)   2. Hyperlipidemia LDL goal <70      PLAN:  In order of problems listed above:  1. Doing well.  Asymptomatic.  Reviewed prior cine angiogram data and feel it is safe to discontinue Plavix.  Continue aspirin.  Atorvastatin 20 mg tablets will be given.  Recheck LDL cholesterol 6 to 8 weeks.  Discontinue irbesartan.  Continue low-dose  beta-blocker therapy.  Call if symptoms.  Clinical follow-up in 1 year. 2. Atorvastatin 20 mg/day.  Lipid and liver panel in 6 to 8 weeks.  Call if any cardiac symptoms.    Medication Adjustments/Labs and Tests Ordered: Current medicines are reviewed at length with the patient today.  Concerns regarding medicines are outlined above.  Medication changes, Labs and Tests ordered today are listed in the Patient Instructions  below. There are no Patient Instructions on file for this visit.   Signed, Lesleigh Noe, MD  06/28/2017 9:03 AM    [facility] [address] [place], [place], [place]  [address] Phone: [phone]; Fax: [phone]

## 2017-06-28 ENCOUNTER — Encounter: Payer: Self-pay | Admitting: Interventional Cardiology

## 2017-06-28 ENCOUNTER — Ambulatory Visit: Payer: 59 | Admitting: Interventional Cardiology

## 2017-06-28 VITALS — BP 102/68 | HR 77 | Ht 75.0 in | Wt 224.2 lb

## 2017-06-28 DIAGNOSIS — I25119 Atherosclerotic heart disease of native coronary artery with unspecified angina pectoris: Secondary | ICD-10-CM | POA: Diagnosis not present

## 2017-06-28 DIAGNOSIS — E785 Hyperlipidemia, unspecified: Secondary | ICD-10-CM

## 2017-06-28 MED ORDER — ATORVASTATIN CALCIUM 20 MG PO TABS: 20.0000 mg | ORAL_TABLET | Freq: Every day | ORAL | 3 refills | Status: DC

## 2017-06-28 NOTE — Patient Instructions (Signed)
Medication Instructions:  1. Stop Avapro 2. Stop Plavix 3. Continue Atoravastatin 20mg  once a day  Labwork: Your physician recommends that you return for lab work in: 6 weeks (lipid, hepatic,bmet)   Testing/Procedures: None Ordered  Follow-Up: Your physician wants you to follow-up in: one year with Dr. Katrinka BlazingSmith. You will receive a reminder letter in the mail two months in advance. If you don't receive a letter, please call our office to schedule the follow-up appointment.   Any Other Special Instructions Will Be Listed Below (If Applicable).     If you need a refill on your cardiac medications before your next appointment, please call your pharmacy.

## 2017-07-24 DIAGNOSIS — I2510 Atherosclerotic heart disease of native coronary artery without angina pectoris: Secondary | ICD-10-CM | POA: Diagnosis not present

## 2017-07-24 DIAGNOSIS — K219 Gastro-esophageal reflux disease without esophagitis: Secondary | ICD-10-CM | POA: Diagnosis not present

## 2017-07-24 DIAGNOSIS — M545 Low back pain: Secondary | ICD-10-CM | POA: Diagnosis not present

## 2017-08-09 ENCOUNTER — Other Ambulatory Visit: Payer: 59

## 2017-08-09 DIAGNOSIS — E785 Hyperlipidemia, unspecified: Secondary | ICD-10-CM | POA: Diagnosis not present

## 2017-08-09 DIAGNOSIS — I25119 Atherosclerotic heart disease of native coronary artery with unspecified angina pectoris: Secondary | ICD-10-CM | POA: Diagnosis not present

## 2017-08-09 LAB — HEPATIC FUNCTION PANEL
ALT: 17 IU/L (ref 0–44)
AST: 11 IU/L (ref 0–40)
Albumin: 4.8 g/dL (ref 3.6–4.8)
Alkaline Phosphatase: 62 IU/L (ref 39–117)
BILIRUBIN TOTAL: 1.4 mg/dL — AB (ref 0.0–1.2)
Bilirubin, Direct: 0.44 mg/dL — ABNORMAL HIGH (ref 0.00–0.40)
TOTAL PROTEIN: 6.7 g/dL (ref 6.0–8.5)

## 2017-08-09 LAB — BASIC METABOLIC PANEL
BUN/Creatinine Ratio: 15 (ref 10–24)
BUN: 14 mg/dL (ref 8–27)
CALCIUM: 9.3 mg/dL (ref 8.6–10.2)
CO2: 25 mmol/L (ref 20–29)
CREATININE: 0.95 mg/dL (ref 0.76–1.27)
Chloride: 99 mmol/L (ref 96–106)
GFR calc Af Amer: 98 mL/min/{1.73_m2} (ref >59)
GFR, EST NON AFRICAN AMERICAN: 85 mL/min/{1.73_m2} (ref >59)
GLUCOSE: 173 mg/dL — AB (ref 65–99)
POTASSIUM: 4 mmol/L (ref 3.5–5.2)
SODIUM: 140 mmol/L (ref 134–144)

## 2017-08-09 LAB — LIPID PANEL
CHOL/HDL RATIO: 3.1 ratio (ref 0.0–5.0)
Cholesterol, Total: 111 mg/dL (ref 100–199)
HDL: 36 mg/dL — ABNORMAL LOW (ref >39)
LDL Calculated: 38 mg/dL (ref 0–99)
Triglycerides: 183 mg/dL — ABNORMAL HIGH (ref 0–149)
VLDL Cholesterol Cal: 37 mg/dL (ref 5–40)

## 2017-08-16 ENCOUNTER — Other Ambulatory Visit: Payer: Self-pay | Admitting: *Deleted

## 2017-08-16 MED ORDER — CARVEDILOL 12.5 MG PO TABS: 12.5000 mg | ORAL_TABLET | Freq: Two times a day (BID) | ORAL | 3 refills | Status: DC

## 2017-11-05 DIAGNOSIS — M722 Plantar fascial fibromatosis: Secondary | ICD-10-CM | POA: Diagnosis not present

## 2017-11-05 DIAGNOSIS — Q667 Congenital pes cavus, unspecified foot: Secondary | ICD-10-CM | POA: Diagnosis not present

## 2017-11-05 DIAGNOSIS — Q66221 Congenital metatarsus adductus, right foot: Secondary | ICD-10-CM | POA: Diagnosis not present

## 2017-11-18 DIAGNOSIS — Z23 Encounter for immunization: Secondary | ICD-10-CM | POA: Diagnosis not present

## 2018-02-12 DIAGNOSIS — R972 Elevated prostate specific antigen [PSA]: Secondary | ICD-10-CM | POA: Diagnosis not present

## 2018-02-12 DIAGNOSIS — Z0000 Encounter for general adult medical examination without abnormal findings: Secondary | ICD-10-CM | POA: Diagnosis not present

## 2018-02-12 DIAGNOSIS — E782 Mixed hyperlipidemia: Secondary | ICD-10-CM | POA: Diagnosis not present

## 2018-05-23 ENCOUNTER — Other Ambulatory Visit: Payer: Self-pay | Admitting: Interventional Cardiology

## 2018-07-22 ENCOUNTER — Telehealth: Payer: Self-pay | Admitting: Interventional Cardiology

## 2018-07-22 NOTE — Telephone Encounter (Signed)

## 2018-07-23 ENCOUNTER — Encounter: Payer: Self-pay | Admitting: Interventional Cardiology

## 2018-07-23 ENCOUNTER — Other Ambulatory Visit: Payer: Self-pay

## 2018-07-23 ENCOUNTER — Ambulatory Visit: Payer: 59 | Admitting: Interventional Cardiology

## 2018-07-23 ENCOUNTER — Encounter (INDEPENDENT_AMBULATORY_CARE_PROVIDER_SITE_OTHER): Payer: Self-pay

## 2018-07-23 VITALS — BP 136/74 | HR 71 | Ht 75.0 in | Wt 220.8 lb

## 2018-07-23 DIAGNOSIS — E785 Hyperlipidemia, unspecified: Secondary | ICD-10-CM

## 2018-07-23 DIAGNOSIS — I25119 Atherosclerotic heart disease of native coronary artery with unspecified angina pectoris: Secondary | ICD-10-CM | POA: Diagnosis not present

## 2018-07-23 DIAGNOSIS — Z7189 Other specified counseling: Secondary | ICD-10-CM | POA: Diagnosis not present

## 2018-07-23 NOTE — Patient Instructions (Signed)
Medication Instructions:  Your physician recommends that you continue on your current medications as directed. Please refer to the Current Medication list given to you today.  If you need a refill on your cardiac medications before your next appointment, please call your pharmacy.   Lab work: Your physician recommends that you return for lab work in: December or January.  You will need to be fasting for these labs (nothing to eat or drink after midnight except water and black coffee).  If you have labs (blood work) drawn today and your tests are completely normal, you will receive your results only by: Marland Kitchen MyChart Message (if you have MyChart) OR . A paper copy in the mail If you have any lab test that is abnormal or we need to change your treatment, we will call you to review the results.  Testing/Procedures: None  Follow-Up: At [facility], you and your health needs are our priority.  As part of our continuing mission to provide you with exceptional heart care, we have created designated Provider Care Teams.  These Care Teams include your primary Cardiologist (physician) and Advanced Practice Providers (APPs -  Physician Assistants and Nurse Practitioners) who all work together to provide you with the care you need, when you need it. You will need a follow up appointment in 12 months.  Please call our office 2 months in advance to schedule this appointment.  You may see Dr. Tamala Julian or one of the following Advanced Practice Providers on your designated Care Team:   Truitt Merle, NP Cecilie Kicks, NP . Kathyrn Drown, NP  Any Other Special Instructions Will Be Listed Below (If Applicable).

## 2018-07-23 NOTE — Progress Notes (Signed)
Cardiology Office Note:    Date:  07/23/2018   ID:  Tyler Fletcher, DOB May 04, 1955, MRN [id number]  PCP:  Asencion GowdaMitchell, L.August Saucerean, MD  Cardiologist:  No primary care provider on file.   Referring MD: Clovis RileyMitchell, L.August Saucerean, MD   Chief Complaint  Patient presents with  . Coronary Artery Disease    History of Present Illness:    Tyler MinusColen E [place]berville is a 63 y.o. male with a hx of acute coronary syndrome, aborted LAD ST elevation myocardial infarction, and treated with drug-eluting stent during the acute episode 06/09/2016.  He has no complaints.  No chest pain.  Has been walking.  Stop smoking cigarettes.  His diet has changed in the timeframe of the COVID pandemic.  No medication side effects.  Past Medical History:  Diagnosis Date  . Blood pressure elevated without history of HTN 06/09/2016  . CAD (coronary artery disease), native coronary artery    06/12/16 PCI w/ DES -->mLAD  . Hyperlipidemia     Past Surgical History:  Procedure Laterality Date  . CORONARY STENT INTERVENTION N/A 06/12/2016   Procedure: Coronary Stent Intervention;  Surgeon: Lyn RecordsSmith, Melo Stauber W, MD;  [facility];  Service: Cardiovascular;  Laterality: N/A;  . LEFT HEART CATH AND CORONARY ANGIOGRAPHY N/A 06/09/2016   Procedure: Left Heart Cath and Coronary Angiography;  Surgeon: Lyn RecordsSmith, Jahkari Maclin W, MD;  [facility];  Service: Cardiovascular;  Laterality: N/A;  . None      Current Medications: Current Meds  Medication Sig  . aspirin 81 MG chewable tablet Chew 1 tablet (81 mg total) by mouth daily.  Marland Kitchen. atorvastatin (LIPITOR) 20 MG tablet Take 1 tablet (20 mg total) by mouth daily.  . carvedilol (COREG) 12.5 MG tablet Take 1 tablet (12.5 mg total) by mouth 2 (two) times daily with a meal.  . etodolac (LODINE) 400 MG tablet Take 400 mg by mouth as needed.  . famotidine (PEPCID) 20 MG tablet Take 20 mg by mouth daily.  Marland Kitchen. HYDROcodone-acetaminophen (NORCO/VICODIN) 5-325 MG tablet Take 1 tablet by mouth as  needed.  . loratadine (CLARITIN) 10 MG tablet Take 10 mg by mouth daily.  . nitroGLYCERIN (NITROSTAT) 0.4 MG SL tablet PLACE 1 TABLET (0.4 MG TOTAL) UNDER THE TONGUE EVERY 5 (FIVE) MINUTES AS NEEDED.  Marland Kitchen. tamsulosin (FLOMAX) 0.4 MG CAPS capsule Take 0.4 mg by mouth daily.  . traMADol (ULTRAM) 50 MG tablet Take 50 mg by mouth every 6 (six) hours as needed.     Allergies:   Patient has no known allergies.   Social History   Socioeconomic History  . Marital status: Married    Spouse name: Not on file  . Number of children: 2  . Years of education: Not on file  . Highest education level: Not on file  Occupational History  . Occupation: Unemployed  Social Needs  . Financial resource strain: Not on file  . Food insecurity    Worry: Not on file    Inability: Not on file  . Transportation needs    Medical: Not on file    Non-medical: Not on file  Tobacco Use  . Smoking status: Never Smoker  . Smokeless tobacco: Never Used  Substance and Sexual Activity  . Alcohol use: No  . Drug use: No  . Sexual activity: Not on file  Lifestyle  . Physical activity    Days per week: Not on file    Minutes per session: Not on file  . Stress: Not on  file  Relationships  . Social Musicianconnections    Talks on phone: Not on file    Gets together: Not on file    Attends religious service: Not on file    Active member of club or organization: Not on file    Attends meetings of clubs or organizations: Not on file    Relationship status: Not on file  Other Topics Concern  . Not on file  Social History Narrative  . Not on file     Family History: The patient's family history includes Heart attack in his father; Heart disease in his father; Hypertension in his mother.  ROS:   Please see the history of present illness.    Has difficulty falling asleep.  Otherwise no complaints.  Does not snore according to his wife.  All other systems reviewed and are negative.  EKGs/Labs/Other Studies Reviewed:     The following studies were reviewed today: No new functional or imaging data.  EKG:  EKG normal sinus rhythm with normal appearance performed on 07/23/2018  Recent Labs: 08/09/2017: ALT 17; BUN 14; Creatinine, Ser 0.95; Potassium 4.0; Sodium 140  Recent Lipid Panel    Component Value Date/Time   CHOL 111 08/09/2017 0748   TRIG 183 (H) 08/09/2017 0748   HDL 36 (L) 08/09/2017 0748   CHOLHDL 3.1 08/09/2017 0748   CHOLHDL 4.1 06/09/2016 0930   VLDL 35 06/09/2016 0930   LDLCALC 38 08/09/2017 0748    Physical Exam:    VS:  BP 136/74   Pulse 71   Ht 6\' 3"  (1.905 m)   Wt 220 lb 12.8 oz (100.2 kg)   SpO2 98%   BMI 27.60 kg/m     Wt Readings from Last 3 Encounters:  07/23/18 220 lb 12.8 oz (100.2 kg)  06/28/17 224 lb 3.2 oz (101.7 kg)  10/10/16 216 lb 11.4 oz (98.3 kg)     GEN: Moderate obesity. No acute distress HEENT: Normal NECK: No JVD. LYMPHATICS: No lymphadenopathy CARDIAC: RRR.  No murmur, no gallop, no edema VASCULAR: 2+ and symmetric pulses, no bruits RESPIRATORY:  Clear to auscultation without rales, wheezing or rhonchi  ABDOMEN: Soft, non-tender, non-distended, No pulsatile mass, MUSCULOSKELETAL: No deformity  SKIN: Warm and dry NEUROLOGIC:  Alert and oriented x 3 PSYCHIATRIC:  Normal affect   ASSESSMENT:    1. Coronary artery disease involving native coronary artery of native heart with angina pectoris (HCC)   2. Hyperlipidemia LDL goal <70   3. Educated About Covid-19 Virus Infection    PLAN:    In order of problems listed above:  1. He is doing well from cardiac standpoint.  Secondary prevention discussed in detail.  The importance of aerobic activity is reiterated. 2. LDL target is less than 70.  January he was less than 50.  Lipid panel and liver panel will be done in December.   Overall education and awareness concerning primary/secondary risk prevention was discussed in detail: LDL less than 70, hemoglobin A1c less than 7, blood pressure target  less than 130/80 mmHg, >150 minutes of moderate aerobic activity per week, avoidance of smoking, weight control (via diet and exercise), and continued surveillance/management of/for obstructive sleep apnea.   Medication Adjustments/Labs and Tests Ordered: Current medicines are reviewed at length with the patient today.  Concerns regarding medicines are outlined above.  Orders Placed This Encounter  Procedures  . EKG 12-Lead   No orders of the defined types were placed in this encounter.   There are no Patient  Instructions on file for this visit.   Signed, Sinclair Grooms, MD  07/23/2018 2:59 PM    [place]

## 2018-08-09 ENCOUNTER — Other Ambulatory Visit: Payer: Self-pay | Admitting: Interventional Cardiology

## 2018-08-31 ENCOUNTER — Other Ambulatory Visit: Payer: Self-pay | Admitting: Interventional Cardiology

## 2018-08-31 DIAGNOSIS — E785 Hyperlipidemia, unspecified: Secondary | ICD-10-CM

## 2018-09-12 ENCOUNTER — Ambulatory Visit: Payer: 59 | Admitting: Interventional Cardiology

## 2019-02-03 ENCOUNTER — Telehealth: Payer: Self-pay | Admitting: *Deleted

## 2019-02-03 NOTE — Telephone Encounter (Signed)
-----   Message from Julio Sicks, LPN sent at 07/22/4693  3:05 PM EDT ----- Regarding: Liver, Lipid Dec-Jan Needs Liver, Lipid in Dec or Jan.  Orders in computer.  Pt asked that I call when closer to time.

## 2019-02-03 NOTE — Telephone Encounter (Signed)
Left message to call back  

## 2019-02-12 NOTE — Telephone Encounter (Signed)
Pt called back.  He is having a physical with PCP next month.  Will plan to get labs drawn at that time.

## 2019-02-12 NOTE — Telephone Encounter (Signed)
Left message to call back  

## 2019-04-02 ENCOUNTER — Telehealth: Payer: Self-pay | Admitting: Interventional Cardiology

## 2019-04-02 NOTE — Telephone Encounter (Signed)
Medical records requested from [place] [facility] [place]. 04/02/19 vlm

## 2019-05-26 ENCOUNTER — Other Ambulatory Visit: Payer: Self-pay

## 2019-05-26 DIAGNOSIS — E785 Hyperlipidemia, unspecified: Secondary | ICD-10-CM

## 2019-05-26 MED ORDER — ATORVASTATIN CALCIUM 20 MG PO TABS: 20.0000 mg | ORAL_TABLET | Freq: Every day | ORAL | 0 refills | Status: DC

## 2019-05-26 MED ORDER — CARVEDILOL 12.5 MG PO TABS: 12.5000 mg | ORAL_TABLET | Freq: Two times a day (BID) | ORAL | 0 refills | Status: DC

## 2019-09-07 DIAGNOSIS — I2510 Atherosclerotic heart disease of native coronary artery without angina pectoris: Secondary | ICD-10-CM | POA: Diagnosis not present

## 2019-09-07 DIAGNOSIS — K219 Gastro-esophageal reflux disease without esophagitis: Secondary | ICD-10-CM | POA: Diagnosis not present

## 2019-09-07 DIAGNOSIS — M545 Low back pain: Secondary | ICD-10-CM | POA: Diagnosis not present

## 2019-09-07 DIAGNOSIS — N529 Male erectile dysfunction, unspecified: Secondary | ICD-10-CM | POA: Diagnosis not present

## 2019-09-07 DIAGNOSIS — N400 Enlarged prostate without lower urinary tract symptoms: Secondary | ICD-10-CM | POA: Diagnosis not present

## 2019-09-25 NOTE — Progress Notes (Signed)
Cardiology Office Note:    Date:  09/29/2019   ID:  JAKAREE PICKARD, DOB [birth year], MRN [id number]  PCP:  Asencion Gowda.August Saucer, MD  Cardiologist:  Lesleigh Noe, MD   Referring MD: Asencion Gowda.August Saucer, MD   Chief Complaint  Patient presents with  . Coronary Artery Disease    History of Present Illness:    Tyler Fletcher is a 64 y.o. male with a hx of  acute coronary syndrome, aborted LAD ST elevation myocardial infarction, and treated with drug-eluting stent during the acute episode 06/09/2016.  He ran out of carvedilol 4 days ago.  He has no complaints.  He denies angina.  Recent laboratory data was reviewed.  Past Medical History:  Diagnosis Date  . Blood pressure elevated without history of HTN 06/09/2016  . CAD (coronary artery disease), native coronary artery    06/12/16 PCI w/ DES -->mLAD  . Hyperlipidemia     Past Surgical History:  Procedure Laterality Date  . CORONARY STENT INTERVENTION N/A 06/12/2016   Procedure: Coronary Stent Intervention;  Surgeon: Lyn Records, MD;  [facility] INVASIVE CV LAB;  Service: Cardiovascular;  Laterality: N/A;  . LEFT HEART CATH AND CORONARY ANGIOGRAPHY N/A 06/09/2016   Procedure: Left Heart Cath and Coronary Angiography;  Surgeon: Lyn Records, MD;  Location: [facility] INVASIVE CV LAB;  Service: Cardiovascular;  Laterality: N/A;  . None      Current Medications: Current Meds  Medication Sig  . Ascorbic Acid (VITAMIN C PO) Take by mouth.  Marland Kitchen aspirin 81 MG chewable tablet Chew 1 tablet (81 mg total) by mouth daily.  Marland Kitchen atorvastatin (LIPITOR) 20 MG tablet Take 1 tablet (20 mg total) by mouth daily. Please make yearly appt with Dr. Katrinka Blazing for Tyler for future refills. 1st attempt  . carvedilol (COREG) 12.5 MG tablet Take 1 tablet (12.5 mg total) by mouth 2 (two) times daily with a meal.  . Cholecalciferol (VITAMIN D3 PO) Take by mouth.  . etodolac (LODINE) 400 MG tablet Take 400 mg by mouth as needed.  . famotidine (PEPCID) 20 MG tablet Take  20 mg by mouth daily.  Marland Kitchen HYDROcodone-acetaminophen (NORCO/VICODIN) 5-325 MG tablet Take 1 tablet by mouth as needed.  . loratadine (CLARITIN) 10 MG tablet Take 10 mg by mouth daily.  Marland Kitchen MAGNESIUM PO Take by mouth.  . Multiple Vitamin (MULTIVITAMIN PO) Take by mouth.  . Multiple Vitamins-Minerals (ZINC PO) Take by mouth.  . nitroGLYCERIN (NITROSTAT) 0.4 MG SL tablet PLACE 1 TABLET (0.4 MG TOTAL) UNDER THE TONGUE EVERY 5 (FIVE) MINUTES AS NEEDED.  Marland Kitchen tamsulosin (FLOMAX) 0.4 MG CAPS capsule Take 0.4 mg by mouth daily.  . traMADol (ULTRAM) 50 MG tablet Take 50 mg by mouth every 6 (six) hours as needed.  . [DISCONTINUED] carvedilol (COREG) 12.5 MG tablet Take 1 tablet (12.5 mg total) by mouth 2 (two) times daily with a meal. Please make yearly appt with Dr. Katrinka Blazing for Tyler for future refills. 1st attempt  . [DISCONTINUED] carvedilol (COREG) 12.5 MG tablet Take 1 tablet (12.5 mg total) by mouth 2 (two) times daily with a meal. Please make yearly appt with Dr. Katrinka Blazing for Tyler for future refills. 1st attempt     Allergies:   Patient has no known allergies.   Social History   Socioeconomic History  . Marital status: Married    Spouse name: Not on file  . Number of children: 2  . Years of education: Not on file  . Highest education level: Not  on file  Occupational History  . Occupation: Unemployed  Tobacco Use  . Smoking status: Never Smoker  . Smokeless tobacco: Never Used  Vaping Use  . Vaping Use: Never used  Substance and Sexual Activity  . Alcohol use: No  . Drug use: No  . Sexual activity: Not on file  Other Topics Concern  . Not on file  Social History Narrative  . Not on file   Social Determinants of Health   Financial Resource Strain:   . Difficulty of Paying Living Expenses: Not on file  Food Insecurity:   . Worried About Programme researcher, broadcasting/film/video in the Last Year: Not on file  . Ran Out of Food in the Last Year: Not on file  Transportation Needs:   . Lack of Transportation  (Medical): Not on file  . Lack of Transportation (Non-Medical): Not on file  Physical Activity:   . Days of Exercise per Week: Not on file  . Minutes of Exercise per Session: Not on file  Stress:   . Feeling of Stress : Not on file  Social Connections:   . Frequency of Communication with Friends and Family: Not on file  . Frequency of Social Gatherings with Friends and Family: Not on file  . Attends Religious Services: Not on file  . Active Member of Clubs or Organizations: Not on file  . Attends Banker Meetings: Not on file  . Marital Status: Not on file     Family History: The patient's family history includes Heart attack in his father; Heart disease in his father; Hypertension in his mother.  ROS:   Please see the history of present illness.    No complaints all other systems reviewed and are negative.  EKGs/Labs/Other Studies Reviewed:    The following studies were reviewed today: No new data  EKG:  EKG normal sinus rhythm with normal appearing EKG.  Recent Labs: No results found for requested labs within last 8760 hours.  Recent Lipid Panel    Component Value Date/Time   CHOL 111 08/09/2017 0748   TRIG 183 (H) 08/09/2017 0748   HDL 36 (L) 08/09/2017 0748   CHOLHDL 3.1 08/09/2017 0748   CHOLHDL 4.1 06/09/2016 0930   VLDL 35 06/09/2016 0930   LDLCALC 38 08/09/2017 0748    Physical Exam:    VS:  BP 138/88   Pulse 82   Ht 6\' 3"  (1.905 m)   Wt 211 lb 6.4 oz (95.9 kg)   SpO2 96%   BMI 26.42 kg/m     Wt Readings from Last 3 Encounters:  09/29/19 211 lb 6.4 oz (95.9 kg)  07/23/18 220 lb 12.8 oz (100.2 kg)  06/28/17 224 lb 3.2 oz (101.7 kg)     GEN: Slender. No acute distress HEENT: Normal NECK: No JVD. LYMPHATICS: No lymphadenopathy CARDIAC:  RRR without murmur, gallop, or edema. VASCULAR:  Normal Pulses. No bruits. RESPIRATORY:  Clear to auscultation without rales, wheezing or rhonchi  ABDOMEN: Soft, non-tender, non-distended, No  pulsatile mass, MUSCULOSKELETAL: No deformity  SKIN: Warm and dry NEUROLOGIC:  Alert and oriented x 3 PSYCHIATRIC:  Normal affect   ASSESSMENT:    1. Coronary artery disease involving native coronary artery of native heart with angina pectoris (HCC)   2. Hyperlipidemia LDL goal <70   3. Essential hypertension   4. Educated about COVID-19 virus infection    PLAN:    In order of problems listed above:  1. Secondary prevention discussed. 2. LDL was 63  when last evaluated in February. 3. Elevated blood pressure today due to being out of carvedilol.  He forgot to refill the medication.  We will give him a prescription for 7 days while he awaits return of mail order supply.  He is on carvedilol 12.5 mg p.o. twice daily.  Discussed rebound phenomenon with the patient. 4. Vaccinated and practicing mitigation techniques.  Clinical follow-up in 1 year.   Medication Adjustments/Labs and Tests Ordered: Current medicines are reviewed at length with the patient today.  Concerns regarding medicines are outlined above.  Orders Placed This Encounter  Procedures  . EKG 12-Lead   Meds ordered this encounter  Medications  . DISCONTD: carvedilol (COREG) 12.5 MG tablet    Sig: Take 1 tablet (12.5 mg total) by mouth 2 (two) times daily with a meal. Please make yearly appt with Dr. Katrinka Blazing for Tyler for future refills. 1st attempt    Dispense:  180 tablet    Refill:  0    Please call our office to schedule an yearly appointment with Dr. Katrinka Blazing for Tyler before anymore refills. [phone]. Thank you 1st attempt  . carvedilol (COREG) 12.5 MG tablet    Sig: Take 1 tablet (12.5 mg total) by mouth 2 (two) times daily with a meal.    Dispense:  14 tablet    Refill:  1    Patient Instructions  Medication Instructions:  Your physician recommends that you continue on your current medications as directed. Please refer to the Current Medication list given to you today.  *If you need a refill on your  cardiac medications before your next appointment, please call your pharmacy*   Lab Work: None If you have labs (blood work) drawn today and your tests are completely normal, you will receive your results only by: Marland Kitchen MyChart Message (if you have MyChart) OR . A paper copy in the mail If you have any lab test that is abnormal or we need to change your treatment, we will call you to review the results.   Testing/Procedures: None   Follow-Up: At [facility], you and your health needs are our priority.  As part of our continuing mission to provide you with exceptional heart care, we have created designated Provider Care Teams.  These Care Teams include your primary Cardiologist (physician) and Advanced Practice Providers (APPs -  Physician Assistants and Nurse Practitioners) who all work together to provide you with the care you need, when you need it.  We recommend signing up for the patient portal called "MyChart".  Sign up information is provided on this After Visit Summary.  MyChart is used to connect with patients for Virtual Visits (Telemedicine).  Patients are able to view lab/test results, encounter notes, upcoming appointments, etc.  Non-urgent messages can be sent to your provider as well.   To learn more about what you can do with MyChart, go to [email or web address].    Your next appointment:   12 month(s)  The format for your next appointment:   In Person  Provider:   You may see Lesleigh Noe, MD or one of the following Advanced Practice Providers on your designated Care Team:    Norma Fredrickson, NP  Nada Boozer, NP  Georgie Chard, NP    Other Instructions      Signed, Lesleigh Noe, MD  09/29/2019 2:51 PM    [facility]

## 2019-09-28 ENCOUNTER — Other Ambulatory Visit: Payer: Self-pay | Admitting: Interventional Cardiology

## 2019-09-29 ENCOUNTER — Other Ambulatory Visit: Payer: Self-pay

## 2019-09-29 ENCOUNTER — Ambulatory Visit (INDEPENDENT_AMBULATORY_CARE_PROVIDER_SITE_OTHER): Payer: Medicare HMO | Admitting: Interventional Cardiology

## 2019-09-29 ENCOUNTER — Encounter: Payer: Self-pay | Admitting: Interventional Cardiology

## 2019-09-29 VITALS — BP 138/88 | HR 82 | Ht 75.0 in | Wt 211.4 lb

## 2019-09-29 DIAGNOSIS — I10 Essential (primary) hypertension: Secondary | ICD-10-CM

## 2019-09-29 DIAGNOSIS — Z7189 Other specified counseling: Secondary | ICD-10-CM | POA: Diagnosis not present

## 2019-09-29 DIAGNOSIS — E785 Hyperlipidemia, unspecified: Secondary | ICD-10-CM

## 2019-09-29 DIAGNOSIS — I25119 Atherosclerotic heart disease of native coronary artery with unspecified angina pectoris: Secondary | ICD-10-CM | POA: Diagnosis not present

## 2019-09-29 MED ORDER — CARVEDILOL 12.5 MG PO TABS: 12.5000 mg | ORAL_TABLET | Freq: Two times a day (BID) | ORAL | 0 refills | Status: DC

## 2019-09-29 MED ORDER — CARVEDILOL 12.5 MG PO TABS
12.5000 mg | ORAL_TABLET | Freq: Two times a day (BID) | ORAL | 1 refills | Status: DC
Start: 2019-09-29 — End: 2019-12-21

## 2019-09-29 NOTE — Patient Instructions (Signed)

## 2019-10-08 DIAGNOSIS — R739 Hyperglycemia, unspecified: Secondary | ICD-10-CM | POA: Diagnosis not present

## 2019-12-21 ENCOUNTER — Other Ambulatory Visit: Payer: Self-pay | Admitting: Interventional Cardiology

## 2020-02-06 ENCOUNTER — Other Ambulatory Visit: Payer: Self-pay | Admitting: Interventional Cardiology

## 2020-02-06 DIAGNOSIS — E785 Hyperlipidemia, unspecified: Secondary | ICD-10-CM

## 2020-03-09 ENCOUNTER — Encounter: Payer: Self-pay | Admitting: Internal Medicine

## 2020-03-10 DIAGNOSIS — Z0000 Encounter for general adult medical examination without abnormal findings: Secondary | ICD-10-CM | POA: Diagnosis not present

## 2020-03-10 DIAGNOSIS — N400 Enlarged prostate without lower urinary tract symptoms: Secondary | ICD-10-CM | POA: Diagnosis not present

## 2020-03-10 DIAGNOSIS — M545 Low back pain, unspecified: Secondary | ICD-10-CM | POA: Diagnosis not present

## 2020-03-10 DIAGNOSIS — R972 Elevated prostate specific antigen [PSA]: Secondary | ICD-10-CM | POA: Diagnosis not present

## 2020-03-10 DIAGNOSIS — K219 Gastro-esophageal reflux disease without esophagitis: Secondary | ICD-10-CM | POA: Diagnosis not present

## 2020-03-10 DIAGNOSIS — Z23 Encounter for immunization: Secondary | ICD-10-CM | POA: Diagnosis not present

## 2020-03-10 DIAGNOSIS — E782 Mixed hyperlipidemia: Secondary | ICD-10-CM | POA: Diagnosis not present

## 2020-03-10 DIAGNOSIS — I2510 Atherosclerotic heart disease of native coronary artery without angina pectoris: Secondary | ICD-10-CM | POA: Diagnosis not present

## 2020-03-10 DIAGNOSIS — N529 Male erectile dysfunction, unspecified: Secondary | ICD-10-CM | POA: Diagnosis not present

## 2020-03-22 DIAGNOSIS — M1712 Unilateral primary osteoarthritis, left knee: Secondary | ICD-10-CM | POA: Diagnosis not present

## 2020-03-24 DIAGNOSIS — H524 Presbyopia: Secondary | ICD-10-CM | POA: Diagnosis not present

## 2020-04-06 ENCOUNTER — Telehealth: Payer: Self-pay | Admitting: *Deleted

## 2020-04-06 NOTE — Telephone Encounter (Signed)
   Primary Cardiologist: Lesleigh Noe, MD  Chart reviewed as part of pre-operative protocol coverage. Because of Tyler Fletcher's past medical history and time since last visit, he will require a follow-up visit in order to better assess preoperative cardiovascular risk.  Pre-op covering staff: - Please schedule appointment and call patient to inform them. If patient already had an upcoming appointment within acceptable timeframe, please add "pre-op clearance" to the appointment notes so provider is aware. - Please contact requesting surgeon's office via preferred method (i.e, phone, fax) to inform them of need for appointment prior to surgery.  If applicable, this message will also be routed to pharmacy pool and/or primary cardiologist for input on holding anticoagulant/antiplatelet agent as requested below so that this information is available to the clearing provider at time of patient's appointment.   Roe Rutherford Duke, PA  04/06/2020, 6:30 PM

## 2020-04-06 NOTE — Telephone Encounter (Signed)
   [facility] Pre-operative Risk Assessment    HEARTCARE STAFF: - Please ensure there is not already an duplicate clearance open for this procedure. - Under Visit Info/Reason for Call, type in Other and utilize the format Clearance MM/DD/YY or Clearance TBD. Do not use dashes or single digits. - If request is for dental extraction, please clarify the # of teeth to be extracted.  Request for surgical clearance:  1. What type of surgery is being performed?  LEFT TOTAL KNEE ARTHROPLASTY    2. When is this surgery scheduled?   05/02/2020  3. What type of clearance is required (medical clearance vs. Pharmacy clearance to hold med vs. Both)?  BOTH  4. Are there any medications that need to be held prior to surgery and how long? ASPIRIN   5. Practice name and name of physician performing surgery?  [facility] / DR. Veverly Fells   6. What is the office phone number?  [phone]   7.   What is the office fax number?  [phone] ATTN:  ASHLEY  8.   Anesthesia type (None, local, MAC, general) ?  SPINAL   Jeanann Lewandowsky 04/06/2020, 11:21 AM  _________________________________________________________________   (provider comments below)

## 2020-04-07 NOTE — Telephone Encounter (Signed)
Patient was called and scheduled to see Chelsea Aus, PA-C on 04/11/20 at 11:15 AM. Patient was informed to arrive at 11:00 AM for check in process and to wear a mask. Patient verbalized understanding and all (if any) questions were answered.

## 2020-04-10 NOTE — Progress Notes (Unsigned)
Cardiology Office Note:    Date:  04/11/2020   ID:  Tyler Fletcher, DOB [birth year], MRN [id number]  PCP:  Asencion Gowda.August Saucer, MD  [facility] Cardiologist:  Lesleigh Noe, MD  [facility] Electrophysiologist:  None   1. Chief Complaint: surgical clearance for LEFT TOTAL KNEE ARTHROPLASTY     History of Present Illness:    Tyler Fletcher is a 65 y.o. male with a hx of CAD, HTN and HLD seen for surgical clearance.   Hx of STEMI 05/2016. Emergent cardiac catheterization with 75% mLAD lesion. Decided against intervention given appearance. Treated with nitro and heparin for 24 hour.  He was sent for Exercise Treadmill Test to determined need for PCI.  While after walking on treadmill developed diffuse ST depressions with TWI while in recovery. Given this finding he was taken for cardiac cath with PCI and DES x1 to mLAD.  Last seen by Dr. Katrinka Blazing 08/2019.   Patient is here for surgical clearance.  Active despite left knee pain.  He is getting at least 10,000 steps per day.  He goes up and down off a stair and walk uphill without angina.  Denies chest pain, shortness of breath, orthopnea, PND, syncope, lower extremity edema or melena.  Past Medical History:  Diagnosis Date  . Blood pressure elevated without history of HTN 06/09/2016  . CAD (coronary artery disease), native coronary artery    06/12/16 PCI w/ DES -->mLAD  . Hyperlipidemia     Past Surgical History:  Procedure Laterality Date  . CORONARY STENT INTERVENTION N/A 06/12/2016   Procedure: Coronary Stent Intervention;  Surgeon: Lyn Records, MD;  Location: [facility] INVASIVE CV LAB;  Service: Cardiovascular;  Laterality: N/A;  . LEFT HEART CATH AND CORONARY ANGIOGRAPHY N/A 06/09/2016   Procedure: Left Heart Cath and Coronary Angiography;  Surgeon: Lyn Records, MD;  Location: [facility] INVASIVE CV LAB;  Service: Cardiovascular;  Laterality: N/A;  . None      Current Medications: Current Meds  Medication Sig  . Ascorbic Acid (VITAMIN  C PO) Take by mouth.  Marland Kitchen aspirin 81 MG chewable tablet Chew 1 tablet (81 mg total) by mouth daily.  Marland Kitchen atorvastatin (LIPITOR) 20 MG tablet TAKE 1 TABLET EVERY DAY (NEED MD APPOINTMENT)  . carvedilol (COREG) 12.5 MG tablet Take 1 tablet (12.5 mg total) by mouth 2 (two) times daily with a meal.  . Cholecalciferol (VITAMIN D3 PO) Take by mouth.  . famotidine (PEPCID) 20 MG tablet Take 20 mg by mouth daily.  Marland Kitchen HYDROcodone-acetaminophen (NORCO/VICODIN) 5-325 MG tablet Take 1 tablet by mouth as needed.  . loratadine (CLARITIN) 10 MG tablet Take 10 mg by mouth daily.  Marland Kitchen MAGNESIUM PO Take by mouth.  . Multiple Vitamin (MULTIVITAMIN PO) Take by mouth.  . nitroGLYCERIN (NITROSTAT) 0.4 MG SL tablet PLACE 1 TABLET (0.4 MG TOTAL) UNDER THE TONGUE EVERY 5 (FIVE) MINUTES AS NEEDED.  Marland Kitchen tamsulosin (FLOMAX) 0.4 MG CAPS capsule Take 0.4 mg by mouth daily.     Allergies:   Patient has no known allergies.   Social History   Socioeconomic History  . Marital status: Married    Spouse name: Not on file  . Number of children: 2  . Years of education: Not on file  . Highest education level: Not on file  Occupational History  . Occupation: Unemployed  Tobacco Use  . Smoking status: Never Smoker  . Smokeless tobacco: Never Used  Vaping Use  . Vaping Use: Never used  Substance  and Sexual Activity  . Alcohol use: No  . Drug use: No  . Sexual activity: Not on file  Other Topics Concern  . Not on file  Social History Narrative  . Not on file   Social Determinants of Health   Financial Resource Strain: Not on file  Food Insecurity: Not on file  Transportation Needs: Not on file  Physical Activity: Not on file  Stress: Not on file  Social Connections: Not on file     Family History: The patient's family history includes Heart attack in his father; Heart disease in his father; Hypertension in his mother.   ROS:   Please see the history of present illness.    All other systems reviewed and are  negative.   EKGs/Labs/Other Studies Reviewed:    The following studies were reviewed today:  Coronary Stent Intervention 05/2016    Conclusion   Successful stent mid LAD reducing a 90% stenosis to 0% with TIMI grade 3 flow. 3.0 x 15 Onyx was post dilated to 3.25 mm in diameter.  Recommendations:   Continue aspirin and Plavix  Likely discharge in a.m.  Diagnostic Dominance: Right    Intervention       EKG:  EKG is ordered today.  The ekg ordered today demonstrates normal sinus rhythm  Recent Labs: No results found for requested labs within last 8760 hours.  Recent Lipid Panel    Component Value Date/Time   CHOL 111 08/09/2017 0748   TRIG 183 (H) 08/09/2017 0748   HDL 36 (L) 08/09/2017 0748   CHOLHDL 3.1 08/09/2017 0748   CHOLHDL 4.1 06/09/2016 0930   VLDL 35 06/09/2016 0930   LDLCALC 38 08/09/2017 0748    Physical Exam:    VS:  BP 124/78   Pulse 77   Ht 6\' 3"  (1.905 m)   Wt 212 lb (96.2 kg)   SpO2 97%   BMI 26.50 kg/m     Wt Readings from Last 3 Encounters:  04/11/20 212 lb (96.2 kg)  09/29/19 211 lb 6.4 oz (95.9 kg)  07/23/18 220 lb 12.8 oz (100.2 kg)     GEN: Well nourished, well developed in no acute distress HEENT: Normal NECK: No JVD; No carotid bruits LYMPHATICS: No lymphadenopathy CARDIAC: RRR, no murmurs, rubs, gallops RESPIRATORY:  Clear to auscultation without rales, wheezing or rhonchi  ABDOMEN: Soft, non-tender, non-distended MUSCULOSKELETAL:  No edema; No deformity  SKIN: Warm and dry NEUROLOGIC:  Alert and oriented x 3 PSYCHIATRIC:  Normal affect   ASSESSMENT AND PLAN:    1. CAD s/p DES to LAD in 2018 No angina.  Continue current medical therapy of aspirin, statin and beta-blocker.  2.  Hyperlipidemia -No results found for requested labs within last 8760 hours.  -Continue Lipitor  3.  Hypertension -Blood pressure stable on carvedilol 12.5 mg twice daily  4.  Surgical clearance Patient is easily getting at least 4  METS of activity without angina. Given past medical history and time since last visit, based on ACC/AHA guidelines, Ritchie E Sinning would be at acceptable risk for the planned procedure without further cardiovascular testing. He can hold ASA 5-7 days prior to surgery. Restart when okay with surgeon.   I will route this recommendation to the requesting party via Epic fax function and remove from pre-op pool.  Please call with questions.  Medication Adjustments/Labs and Tests Ordered: Current medicines are reviewed at length with the patient today.  Concerns regarding medicines are outlined above.  Orders Placed This Encounter  Procedures  . EKG 12-Lead   No orders of the defined types were placed in this encounter.   Patient Instructions  Medication Instructions:  Your physician recommends that you continue on your current medications as directed. Please refer to the Current Medication list given to you today. *If you need a refill on your cardiac medications before your next appointment, please call your pharmacy*   Lab Work: None If you have labs (blood work) drawn today and your tests are completely normal, you will receive your results only by: Marland Kitchen MyChart Message (if you have MyChart) OR . A paper copy in the mail If you have any lab test that is abnormal or we need to change your treatment, we will call you to review the results.   Testing/Procedures: None   Follow-Up: At [facility], you and your health needs are our priority.  As part of our continuing mission to provide you with exceptional heart care, we have created designated Provider Care Teams.  These Care Teams include your primary Cardiologist (physician) and Advanced Practice Providers (APPs -  Physician Assistants and Nurse Practitioners) who all work together to provide you with the care you need, when you need it.  We recommend signing up for the patient portal called "MyChart".  Sign up information is provided  on this After Visit Summary.  MyChart is used to connect with patients for Virtual Visits (Telemedicine).  Patients are able to view lab/test results, encounter notes, upcoming appointments, etc.  Non-urgent messages can be sent to your provider as well.   To learn more about what you can do with MyChart, go to [email or web address].    Your next appointment:   5 month(s)  The format for your next appointment:   In Person  Provider:   Verdis Prime, MD        Signed, Manson Passey, [place]  04/11/2020 11:38 AM    [facility]

## 2020-04-11 ENCOUNTER — Other Ambulatory Visit: Payer: Self-pay

## 2020-04-11 ENCOUNTER — Ambulatory Visit: Payer: Medicare HMO | Admitting: Physician Assistant

## 2020-04-11 ENCOUNTER — Encounter: Payer: Self-pay | Admitting: Physician Assistant

## 2020-04-11 VITALS — BP 124/78 | HR 77 | Ht 75.0 in | Wt 212.0 lb

## 2020-04-11 DIAGNOSIS — I25119 Atherosclerotic heart disease of native coronary artery with unspecified angina pectoris: Secondary | ICD-10-CM

## 2020-04-11 DIAGNOSIS — Z01810 Encounter for preprocedural cardiovascular examination: Secondary | ICD-10-CM

## 2020-04-11 DIAGNOSIS — E785 Hyperlipidemia, unspecified: Secondary | ICD-10-CM | POA: Diagnosis not present

## 2020-04-11 DIAGNOSIS — I10 Essential (primary) hypertension: Secondary | ICD-10-CM

## 2020-04-11 NOTE — Patient Instructions (Signed)
Medication Instructions:  Your physician recommends that you continue on your current medications as directed. Please refer to the Current Medication list given to you today. *If you need a refill on your cardiac medications before your next appointment, please call your pharmacy*   Lab Work: None If you have labs (blood work) drawn today and your tests are completely normal, you will receive your results only by: Marland Kitchen MyChart Message (if you have MyChart) OR . A paper copy in the mail If you have any lab test that is abnormal or we need to change your treatment, we will call you to review the results.   Testing/Procedures: None   Follow-Up: At [facility] you and your health needs are our priority.  As part of our continuing mission to provide you with exceptional heart care, we have created designated Provider Care Teams.  These Care Teams include your primary Cardiologist (physician) and Advanced Practice Providers (APPs -  Physician Assistants and Nurse Practitioners) who all work together to provide you with the care you need, when you need it.  We recommend signing up for the patient portal called "MyChart".  Sign up information is provided on this After Visit Summary.  MyChart is used to connect with patients for Virtual Visits (Telemedicine).  Patients are able to view lab/test results, encounter notes, upcoming appointments, etc.  Non-urgent messages can be sent to your provider as well.   To learn more about what you can do with MyChart, go to [email or web address].    Your next appointment:   5 month(s)  The format for your next appointment:   In Person  Provider:   Verdis Prime, MD

## 2020-04-22 DIAGNOSIS — M25569 Pain in unspecified knee: Secondary | ICD-10-CM | POA: Diagnosis not present

## 2020-05-02 DIAGNOSIS — M1712 Unilateral primary osteoarthritis, left knee: Secondary | ICD-10-CM | POA: Diagnosis not present

## 2020-05-02 DIAGNOSIS — G8918 Other acute postprocedural pain: Secondary | ICD-10-CM | POA: Diagnosis not present

## 2020-05-05 ENCOUNTER — Other Ambulatory Visit: Payer: Self-pay

## 2020-05-05 ENCOUNTER — Encounter: Payer: Self-pay | Admitting: Physical Therapy

## 2020-05-05 ENCOUNTER — Ambulatory Visit: Payer: Medicare HMO | Attending: Orthopedic Surgery | Admitting: Physical Therapy

## 2020-05-05 DIAGNOSIS — M25562 Pain in left knee: Secondary | ICD-10-CM

## 2020-05-05 DIAGNOSIS — M25662 Stiffness of left knee, not elsewhere classified: Secondary | ICD-10-CM | POA: Diagnosis not present

## 2020-05-05 DIAGNOSIS — R2689 Other abnormalities of gait and mobility: Secondary | ICD-10-CM | POA: Diagnosis not present

## 2020-05-05 DIAGNOSIS — M6281 Muscle weakness (generalized): Secondary | ICD-10-CM | POA: Diagnosis not present
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
## 2020-05-11 ENCOUNTER — Other Ambulatory Visit: Payer: Self-pay

## 2020-05-11 ENCOUNTER — Ambulatory Visit: Payer: Medicare HMO | Admitting: Physical Therapy

## 2020-05-11 ENCOUNTER — Encounter: Payer: Self-pay | Admitting: Physical Therapy

## 2020-05-11 DIAGNOSIS — M25562 Pain in left knee: Secondary | ICD-10-CM | POA: Diagnosis not present

## 2020-05-11 DIAGNOSIS — R2689 Other abnormalities of gait and mobility: Secondary | ICD-10-CM

## 2020-05-11 DIAGNOSIS — M25662 Stiffness of left knee, not elsewhere classified: Secondary | ICD-10-CM

## 2020-05-11 DIAGNOSIS — M6281 Muscle weakness (generalized): Secondary | ICD-10-CM

## 2020-05-11 NOTE — Therapy (Signed)
[facility] Outpatient Rehabilitation [facility] [address]  [address] [place], [place], [address] Phone: [phone]   Fax:  [phone]  Physical Therapy Treatment  Patient Details  Name: Tyler Fletcher MRN: [id number] Date of Birth: 08/25/1955 Referring Provider (PT): Beverely Low, MD   Encounter Date: 05/11/2020   PT End of Session - 05/11/20 0805    Visit Number 2    Number of Visits 13    Date for PT Re-Evaluation 06/16/20    Authorization Type [employer]    Authorization Time Period 12 visits approved 04/07-05/19    Authorization - Visit Number 1    Authorization - Number of Visits 12    PT Start Time 0805    PT Stop Time 0845    PT Time Calculation (min) 40 min    Equipment Utilized During Treatment Gait belt    Activity Tolerance Patient tolerated treatment well;Patient limited by pain    Behavior During Therapy [facility] for tasks assessed/performed           Past Medical History:  Diagnosis Date  . Blood pressure elevated without history of HTN 06/09/2016  . CAD (coronary artery disease), native coronary artery    06/12/16 PCI w/ DES -->mLAD  . Hyperlipidemia     Past Surgical History:  Procedure Laterality Date  . CORONARY STENT INTERVENTION N/A 06/12/2016   Procedure: Coronary Stent Intervention;  Surgeon: Lyn Records, MD;  Location: [facility] INVASIVE CV LAB;  Service: Cardiovascular;  Laterality: N/A;  . LEFT HEART CATH AND CORONARY ANGIOGRAPHY N/A 06/09/2016   Procedure: Left Heart Cath and Coronary Angiography;  Surgeon: Lyn Records, MD;  Location: [facility] INVASIVE CV LAB;  Service: Cardiovascular;  Laterality: N/A;  . None      There were no vitals filed for this visit.   Subjective Assessment - 05/11/20 0806    Subjective Reports that things started to get better since Sunday. Denies questions/concerns on HEP. Asking if he can go without an AD at home.    Pertinent History HLD, CAD, HTN    Diagnostic tests none recent    Patient  Stated Goals decrease pain    Currently in Pain? Yes    Pain Score 4     Pain Location Knee    Pain Orientation Left    Pain Descriptors / Indicators Aching    Pain Type Acute pain;Surgical pain              [facility] PT Assessment - 05/11/20 0812      Observation/Other Assessments   Focus on Therapeutic Outcomes (FOTO)  knee: 49                         [facility] Adult PT Treatment/Exercise - 05/11/20 0001      Ambulation/Gait   Ambulation Distance (Feet) 170 Feet    Assistive device Straight cane    Gait Pattern Step-to pattern;Decreased step length - right;Decreased stance time - left;Decreased hip/knee flexion - left;Decreased weight shift to left;Trunk flexed    Ambulation Surface Level;Indoor    Gait velocity decreased    Gait Comments manual assistance to maintain SPC sequencing and encouragement to perform step-through pattern      Exercises   Exercises Knee/Hip      Knee/Hip Exercises: Stretches   Passive Hamstring Stretch Left;2 reps;30 seconds    Passive Hamstring Stretch Limitations supine with strap      Knee/Hip Exercises: Aerobic   Nustep  L2 x 6 min (UE/LEs)      Knee/Hip Exercises: Standing   Knee Flexion AROM;Left;1 set;10 reps    Knee Flexion Limitations HS curl in RW    Hip Flexion AROM;Both;1 set;10 reps    Hip Flexion Limitations high knee march in RW   anterior trunk lean     Knee/Hip Exercises: Seated   Long Arc Quad Strengthening;Left;2 sets;5 reps    Long Arc Quad Limitations lacking TKE    Hamstring Curl Strengthening;Left;1 set;10 reps    Hamstring Limitations red TB      Knee/Hip Exercises: Supine   Quad Sets Strengthening;Left;1 set;5 reps    Quad Sets Limitations 5x5" with 1/2 bolster under heel    Heel Slides AAROM;Left;1 set;10 reps    Heel Slides Limitations 10x3" with strap and pball    Straight Leg Raises Strengthening;Left;2 sets;5 reps    Straight Leg Raises Limitations very minor PT assistance; quad lag evident                     PT Short Term Goals - 05/11/20 0846      PT SHORT TERM GOAL #1   Title Patient to be independent with initial HEP.    Time 3    Period Weeks    Status On-going    Target Date 05/26/20             PT Long Term Goals - 05/11/20 0846      PT LONG TERM GOAL #1   Title Patient to be independent with advanced HEP.    Time 6    Period Weeks    Status On-going      PT LONG TERM GOAL #2   Title Patient to demonstrate L knee AROM 0-120 degrees.    Time 6    Period Weeks    Status On-going      PT LONG TERM GOAL #3   Title Patient to demonstrate B LE strength >/=4+/5.    Time 6    Period Weeks    Status On-going      PT LONG TERM GOAL #4   Title Patient to demonstrate SLR on L LE without quad lag evident.    Time 6    Period Weeks    Status On-going      PT LONG TERM GOAL #5   Title Patient to demonstrate symmetrical step length, knee flexion, and weight shift with LRAD.    Time 6    Period Weeks    Status On-going                 Plan - 05/11/20 0846    Clinical Impression Statement Patient arrived to session without new complaints, but inquiring on if he is able to wean off all AD at this time. Encouraged patient to practice ambulating in the home with [facility], but to avoid weaning off AD entirely d/t intermittent knee buckling. Patient reported understanding. Denied questions/concerns on HEP. Worked on Investment banker, operational with SPC with verbal and manual cueing to encourage SPC sequencing and encouragement to perform step-through pattern. Patient with decent stability, but will require more practice with sequencing. Patient performed heel slides within tolerable ROM. Demonstrated good L knee extension ROM with quad sets. Quad lag evident with SLRs, thus educated patient on importance of compliance with this exercise for max quad stability with ambulation and explained need for AD. Excessive anterior trunk lean evident with standing LE strengthening  ther-ex in RW today, thus  adjusted RW to more appropriate height, which improved posture. Patient declined ice at end of session and without complaints upon leaving.    Comorbidities HLD, CAD, HTN    PT Treatment/Interventions ADLs/Self Care Home Management;Cryotherapy;Electrical Stimulation;Iontophoresis 4mg /ml Dexamethasone;Moist Heat;Balance training;Therapeutic exercise;Therapeutic activities;Functional mobility training;Stair training;Gait training;Ultrasound;Neuromuscular re-education;Patient/family education;Manual techniques;Vasopneumatic Device;Taping;Energy conservation;Dry needling;Passive range of motion;Scar mobilization    PT Next Visit Plan gait training with AD; progress L knee ROM and strengthening    Consulted and Agree with Plan of Care Patient           Patient will benefit from skilled therapeutic intervention in order to improve the following deficits and impairments:  Abnormal gait,Hypomobility,Increased edema,Decreased scar mobility,Decreased activity tolerance,Decreased strength,Increased fascial restricitons,Pain,Difficulty walking,Increased muscle spasms,Improper body mechanics,Decreased range of motion,Impaired flexibility,Postural dysfunction  Visit Diagnosis: Acute pain of left knee  Stiffness of left knee, not elsewhere classified  Muscle weakness (generalized)  Other abnormalities of gait and mobility     Problem List Patient Active Problem List   Diagnosis Date Noted  . Acute ST elevation myocardial infarction (STEMI) involving left anterior descending (LAD) coronary artery (HCC) 06/09/2016  . Hyperlipidemia LDL goal <70 06/09/2016  . STEMI (ST elevation myocardial infarction) (HCC) 06/09/2016  . Aborted myocardial infarction (HCC)   . Coronary artery disease involving native coronary artery of native heart with angina pectoris [facility])      [facility], PT, DPT 05/11/20 8:47 AM   [facility] [address]  [address] [place], [place], [place] Phone: [phone]   Fax:  [phone]  Name: Tyler Fletcher MRN: [name] Date of Birth: 02/18/1955

## 2020-05-13 ENCOUNTER — Other Ambulatory Visit: Payer: Self-pay

## 2020-05-13 ENCOUNTER — Encounter: Payer: Self-pay | Admitting: Physical Therapy

## 2020-05-13 ENCOUNTER — Ambulatory Visit: Payer: Medicare HMO | Admitting: Physical Therapy

## 2020-05-13 DIAGNOSIS — R2689 Other abnormalities of gait and mobility: Secondary | ICD-10-CM | POA: Diagnosis not present

## 2020-05-13 DIAGNOSIS — M25662 Stiffness of left knee, not elsewhere classified: Secondary | ICD-10-CM

## 2020-05-13 DIAGNOSIS — M25562 Pain in left knee: Secondary | ICD-10-CM

## 2020-05-13 DIAGNOSIS — M6281 Muscle weakness (generalized): Secondary | ICD-10-CM | POA: Diagnosis not present
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
## 2020-05-17 ENCOUNTER — Other Ambulatory Visit: Payer: Self-pay

## 2020-05-17 ENCOUNTER — Encounter: Payer: Self-pay | Admitting: Physical Therapy

## 2020-05-17 ENCOUNTER — Ambulatory Visit: Payer: Medicare HMO | Admitting: Physical Therapy

## 2020-05-17 DIAGNOSIS — Z4789 Encounter for other orthopedic aftercare: Secondary | ICD-10-CM | POA: Diagnosis not present

## 2020-05-17 DIAGNOSIS — M25562 Pain in left knee: Secondary | ICD-10-CM | POA: Diagnosis not present

## 2020-05-17 DIAGNOSIS — R2689 Other abnormalities of gait and mobility: Secondary | ICD-10-CM

## 2020-05-17 DIAGNOSIS — M25662 Stiffness of left knee, not elsewhere classified: Secondary | ICD-10-CM

## 2020-05-17 DIAGNOSIS — M6281 Muscle weakness (generalized): Secondary | ICD-10-CM

## 2020-05-17 NOTE — Therapy (Signed)
[facility] Outpatient Rehabilitation [facility] [address]  [address] [place], [place], [address] Phone: [phone]   Fax:  [phone]  Physical Therapy Treatment  Patient Details  Name: Tyler Fletcher MRN: [id number] Date of Birth: 02-08-1955 Referring Provider (PT): Beverely Low, MD   Encounter Date: 05/17/2020   PT End of Session - 05/17/20 1737    Visit Number 4    Number of Visits 13    Date for PT Re-Evaluation 06/16/20    Authorization Type [employer]    Authorization Time Period 12 visits approved 04/07-05/19    Authorization - Visit Number 3    Authorization - Number of Visits 12    PT Start Time 1700    PT Stop Time 1735    PT Time Calculation (min) 35 min    Activity Tolerance Patient tolerated treatment well;Patient limited by fatigue    Behavior During Therapy [facility] for tasks assessed/performed           Past Medical History:  Diagnosis Date  . Blood pressure elevated without history of HTN 06/09/2016  . CAD (coronary artery disease), native coronary artery    06/12/16 PCI w/ DES -->mLAD  . Hyperlipidemia     Past Surgical History:  Procedure Laterality Date  . CORONARY STENT INTERVENTION N/A 06/12/2016   Procedure: Coronary Stent Intervention;  Surgeon: Lyn Records, MD;  Location: [facility] INVASIVE CV LAB;  Service: Cardiovascular;  Laterality: N/A;  . LEFT HEART CATH AND CORONARY ANGIOGRAPHY N/A 06/09/2016   Procedure: Left Heart Cath and Coronary Angiography;  Surgeon: Lyn Records, MD;  Location: [facility] INVASIVE CV LAB;  Service: Cardiovascular;  Laterality: N/A;  . None      There were no vitals filed for this visit.   Subjective Assessment - 05/17/20 1703    Subjective Patient reporting that he recently saw his surgeon who removed his ace bandage and told him that he is doing a good job.    Pertinent History HLD, CAD, HTN    Diagnostic tests none recent    Patient Stated Goals decrease pain    Currently in Pain? Yes    Pain  Score 5     Pain Location Knee    Pain Orientation Left    Pain Descriptors / Indicators Aching;Sharp    Pain Type Acute pain;Surgical pain                             [facility] Adult PT Treatment/Exercise - 05/17/20 0001      Knee/Hip Exercises: Aerobic   Recumbent Bike L1 x 7 min (partial revolutions)      Knee/Hip Exercises: Standing   Knee Flexion Strengthening;Right;Left;1 set;10 reps    Knee Flexion Limitations 2# at counter    Hip Flexion Stengthening;Both;1 set;Knee bent    Hip Flexion Limitations 16x march with 2#    Terminal Knee Extension Strengthening;Left;1 set;10 reps;Theraband    Theraband Level (Terminal Knee Extension) Level 4 (Blue)    Terminal Knee Extension Limitations 10x3" with ski pole    Wall Squat 1 set;10 reps    Wall Squat Limitations depth to tolerance   cues to shift L   Other Standing Knee Exercises L knee flexion stretch on step at counter 10x3"      Knee/Hip Exercises: Seated   Sit to Sand 2 sets;5 reps;without UE support   sitting on airex; min A required for first rep  Knee/Hip Exercises: Supine   Straight Leg Raises Strengthening;Left;2 sets;5 reps    Straight Leg Raises Limitations 5x SLR, 5x SLR with ER   more challenge with ER     Manual Therapy   Manual Therapy Joint mobilization    Manual therapy comments supine    Joint Mobilization L patellar mobs in all directions grade III/IV   good mobility                 PT Education - 05/17/20 1736    Education Details edu on importance for proper nutrition for healing    Person(s) Educated Patient    Methods Explanation;Demonstration;Tactile cues;Verbal cues;Handout    Comprehension Verbalized understanding;Returned demonstration            PT Short Term Goals - 05/17/20 1737      PT SHORT TERM GOAL #1   Title Patient to be independent with initial HEP.    Time 3    Period Weeks    Status Achieved    Target Date 05/26/20             PT Long Term  Goals - 05/11/20 0846      PT LONG TERM GOAL #1   Title Patient to be independent with advanced HEP.    Time 6    Period Weeks    Status On-going      PT LONG TERM GOAL #2   Title Patient to demonstrate L knee AROM 0-120 degrees.    Time 6    Period Weeks    Status On-going      PT LONG TERM GOAL #3   Title Patient to demonstrate B LE strength >/=4+/5.    Time 6    Period Weeks    Status On-going      PT LONG TERM GOAL #4   Title Patient to demonstrate SLR on L LE without quad lag evident.    Time 6    Period Weeks    Status On-going      PT LONG TERM GOAL #5   Title Patient to demonstrate symmetrical step length, knee flexion, and weight shift with LRAD.    Time 6    Period Weeks    Status On-going                 Plan - 05/17/20 1737    Clinical Impression Statement Patient arrived to session with report that he recently saw his surgeon who removed his ace bandage and was pleased with his progress. Patient today without wrapping covering L knee incision, revealing well-healing incision. Tolerated L patellar glides in all directions with good mobility. Proceeded with quad strengthening with slight increased challenge with good tolerance, however with quad lag still evident. Proceeded with standing LE strengthening ther-ex, with patient demonstrating consistent anterior trunk lean. Initiated wall squats with patient requiring cues to shift L with good carryover. Tolerated initiation of resisted TKE with patient demonstrating good ROM. Patient slightly more fatigued today and upon discussion, admitted to poor nutrition d/t lack of appetite- did educate patient on importance of proper nutrition for healing. Patient reported understanding. Deferred modalities today per patient's request. No complaints at end of session.    Comorbidities HLD, CAD, HTN    PT Treatment/Interventions ADLs/Self Care Home Management;Cryotherapy;Electrical Stimulation;Iontophoresis 4mg /ml  Dexamethasone;Moist Heat;Balance training;Therapeutic exercise;Therapeutic activities;Functional mobility training;Stair training;Gait training;Ultrasound;Neuromuscular re-education;Patient/family education;Manual techniques;Vasopneumatic Device;Taping;Energy conservation;Dry needling;Passive range of motion;Scar mobilization    PT Next Visit Plan progress L knee ROM and strengthening  Consulted and Agree with Plan of Care Patient           Patient will benefit from skilled therapeutic intervention in order to improve the following deficits and impairments:  Abnormal gait,Hypomobility,Increased edema,Decreased scar mobility,Decreased activity tolerance,Decreased strength,Increased fascial restricitons,Pain,Difficulty walking,Increased muscle spasms,Improper body mechanics,Decreased range of motion,Impaired flexibility,Postural dysfunction  Visit Diagnosis: Acute pain of left knee  Stiffness of left knee, not elsewhere classified  Muscle weakness (generalized)  Other abnormalities of gait and mobility     Problem List Patient Active Problem List   Diagnosis Date Noted  . Acute ST elevation myocardial infarction (STEMI) involving left anterior descending (LAD) coronary artery (HCC) 06/09/2016  . Hyperlipidemia LDL goal <70 06/09/2016  . STEMI (ST elevation myocardial infarction) (HCC) 06/09/2016  . Aborted myocardial infarction (HCC)   . Coronary artery disease involving native coronary artery of native heart with angina pectoris [facility])      Anette Guarneri, PT, DPT 05/17/20 5:39 PM   [facility] Outpatient Rehabilitation [facility] [address]  [address] [place], [place], [address] Phone: [phone]   Fax:  [phone]  Name: MAJID MCCRAVY MRN: [id number] Date of Birth: 1955-03-13

## 2020-05-20 ENCOUNTER — Ambulatory Visit: Payer: Medicare HMO | Admitting: Physical Therapy

## 2020-05-20 ENCOUNTER — Encounter: Payer: Self-pay | Admitting: Physical Therapy

## 2020-05-20 ENCOUNTER — Other Ambulatory Visit: Payer: Self-pay

## 2020-05-20 DIAGNOSIS — R2689 Other abnormalities of gait and mobility: Secondary | ICD-10-CM

## 2020-05-20 DIAGNOSIS — M25562 Pain in left knee: Secondary | ICD-10-CM | POA: Diagnosis not present

## 2020-05-20 DIAGNOSIS — M25662 Stiffness of left knee, not elsewhere classified: Secondary | ICD-10-CM

## 2020-05-20 DIAGNOSIS — M6281 Muscle weakness (generalized): Secondary | ICD-10-CM | POA: Diagnosis not present

## 2020-05-20 NOTE — Therapy (Signed)
[facility] [address]  [address] [place], [place], [address] Phone: [phone]   Fax:  [phone]  Physical Therapy Treatment  Patient Details  Name: Tyler Fletcher MRN: [id number] Date of Birth: [birth year] Referring Provider (PT): Beverely Low, MD   Encounter Date: 05/20/2020   PT End of Session - 05/20/20 0844    Visit Number 5    Number of Visits 13    Date for PT Re-Evaluation 06/16/20    Authorization Type [employer]    Authorization Time Period 12 visits approved 04/07-05/19    Authorization - Visit Number 4    Authorization - Number of Visits 12    PT Start Time 0806    PT Stop Time 0844    PT Time Calculation (min) 38 min    Activity Tolerance Patient tolerated treatment well    Behavior During Therapy [facility] for tasks assessed/performed           Past Medical History:  Diagnosis Date  . Blood pressure elevated without history of HTN 06/09/2016  . CAD (coronary artery disease), native coronary artery    06/12/16 PCI w/ DES -->mLAD  . Hyperlipidemia     Past Surgical History:  Procedure Laterality Date  . CORONARY STENT INTERVENTION N/A 06/12/2016   Procedure: Coronary Stent Intervention;  Surgeon: Lyn Records, MD;  Location: [facility] INVASIVE CV LAB;  Service: Cardiovascular;  Laterality: N/A;  . LEFT HEART CATH AND CORONARY ANGIOGRAPHY N/A 06/09/2016   Procedure: Left Heart Cath and Coronary Angiography;  Surgeon: Lyn Records, MD;  Location: [facility] INVASIVE CV LAB;  Service: Cardiovascular;  Laterality: N/A;  . None      There were no vitals filed for this visit.   Subjective Assessment - 05/20/20 0808    Subjective "Feeling much better." Remarking on how the nustep is easier to do now.    Pertinent History HLD, CAD, HTN    Diagnostic tests none recent    Patient Stated Goals decrease pain    Currently in Pain? Yes    Pain Score 4     Pain Location Knee    Pain Orientation Left    Pain  Descriptors / Indicators Aching;Sharp    Pain Type Acute pain;Surgical pain              [facility] PT Assessment - 05/20/20 0001      AROM   Left Knee Extension 3      PROM   Left Knee Extension 1                         [facility] Adult PT Treatment/Exercise - 05/20/20 0001      Knee/Hip Exercises: Stretches   Hip Flexor Stretch Left;30 seconds;2 reps    Hip Flexor Stretch Limitations mod thomas with strap      Knee/Hip Exercises: Aerobic   Nustep L3 x 5 min (UE/LEs)      Knee/Hip Exercises: Standing   Forward Step Up Left;Hand Hold: 1;1 set;10 reps;Step Height: 6"    Forward Step Up Limitations L step up/back   heavy counter support and "hoisting" self up   Step Down Left;1 set;5 reps;Hand Hold: 1;Step Height: 4"    Step Down Limitations L step up/over    Functional Squat 1 set;10 reps    Functional Squat Limitations at TM rail      Knee/Hip Exercises: Seated   Long Arc Quad Strengthening;Left;1 set;10 reps  Long Arc Quad Weight 2 lbs.    Long Arc [employer] Limitations lacking TKE    Sit to [employer] without UE support;1 set;10 reps   sitting on airex; cueing for proper set up with great carryover     Knee/Hip Exercises: Supine   Straight Leg Raises Strengthening;Left;1 set;10 reps    Straight Leg Raises Limitations 1#      Manual Therapy   Manual Therapy Joint mobilization    Manual therapy comments supine    Joint Mobilization L patellar mobs in all directions grade III/IV, focusing on superior glide; L knee AP extension mobs with 1/2 bolster under heel grade II/III to tolerance                    PT Short Term Goals - 05/17/20 1737      PT SHORT TERM GOAL #1   Title Patient to be independent with initial HEP.    Time 3    Period Weeks    Status Achieved    Target Date 05/26/20             PT Long Term Goals - 05/11/20 0846      PT LONG TERM GOAL #1   Title Patient to be independent with advanced HEP.    Time 6    Period Weeks     Status On-going      PT LONG TERM GOAL #2   Title Patient to demonstrate L knee AROM 0-120 degrees.    Time 6    Period Weeks    Status On-going      PT LONG TERM GOAL #3   Title Patient to demonstrate B LE strength >/=4+/5.    Time 6    Period Weeks    Status On-going      PT LONG TERM GOAL #4   Title Patient to demonstrate SLR on L LE without quad lag evident.    Time 6    Period Weeks    Status On-going      PT LONG TERM GOAL #5   Title Patient to demonstrate symmetrical step length, knee flexion, and weight shift with LRAD.    Time 6    Period Weeks    Status On-going                 Plan - 05/20/20 0844    Clinical Impression Statement Patient reports "feeling much better" this morning. Continues to ambulate with good stability with SPC. Worked on STS transfers with seat slightly elevated. Provided cueing for proper set up with great carryover. Patient also able to tolerate increased resistance with LAQ today, however still lacking TKE. Also increased weighted resistance with SLR's with good tolerance, however with difficulty achieving TKE here as well. Worked on L patellar and tibiofemoral joint mobs for improved knee extension with patient being able to ready 3 deg AROM and 1 deg PROM extension after MT. Remainder of session focused on standing LE strengthening to address quad stability. Patient demonstrated good effort today and without complaints at end of session.    Comorbidities HLD, CAD, HTN    PT Treatment/Interventions ADLs/Self Care Home Management;Cryotherapy;Electrical Stimulation;Iontophoresis 4mg /ml Dexamethasone;Moist Heat;Balance training;Therapeutic exercise;Therapeutic activities;Functional mobility training;Stair training;Gait training;Ultrasound;Neuromuscular re-education;Patient/family education;Manual techniques;Vasopneumatic Device;Taping;Energy conservation;Dry needling;Passive range of motion;Scar mobilization    PT Next Visit Plan progress L knee  ROM and strengthening    Consulted and Agree with Plan of Care Patient           Patient  will benefit from skilled therapeutic intervention in order to improve the following deficits and impairments:  Abnormal gait,Hypomobility,Increased edema,Decreased scar mobility,Decreased activity tolerance,Decreased strength,Increased fascial restricitons,Pain,Difficulty walking,Increased muscle spasms,Improper body mechanics,Decreased range of motion,Impaired flexibility,Postural dysfunction  Visit Diagnosis: Acute pain of left knee  Stiffness of left knee, not elsewhere classified  Muscle weakness (generalized)  Other abnormalities of gait and mobility     Problem List Patient Active Problem List   Diagnosis Date Noted  . Acute ST elevation myocardial infarction (STEMI) involving left anterior descending (LAD) coronary artery (HCC) 06/09/2016  . Hyperlipidemia LDL goal <70 06/09/2016  . STEMI (ST elevation myocardial infarction) (HCC) 06/09/2016  . Aborted myocardial infarction (HCC)   . Coronary artery disease involving native coronary artery of native heart with angina pectoris [facility])       Anette Guarneri, PT, DPT 05/20/20 8:46 AM   [facility] [address]  [address] [place], [place], [address] Phone: [phone]   Fax:  [phone]  Name: Tyler Fletcher MRN: [id number] Date of Birth: July 19, 1955

## 2020-05-23 ENCOUNTER — Ambulatory Visit: Payer: Medicare HMO | Admitting: Physical Therapy

## 2020-05-23 ENCOUNTER — Encounter: Payer: Self-pay | Admitting: Physical Therapy

## 2020-05-23 ENCOUNTER — Other Ambulatory Visit: Payer: Self-pay

## 2020-05-23 DIAGNOSIS — M25562 Pain in left knee: Secondary | ICD-10-CM | POA: Diagnosis not present

## 2020-05-23 DIAGNOSIS — M6281 Muscle weakness (generalized): Secondary | ICD-10-CM | POA: Diagnosis not present

## 2020-05-23 DIAGNOSIS — M25662 Stiffness of left knee, not elsewhere classified: Secondary | ICD-10-CM | POA: Diagnosis not present

## 2020-05-23 DIAGNOSIS — R2689 Other abnormalities of gait and mobility: Secondary | ICD-10-CM

## 2020-05-23 NOTE — Therapy (Signed)
[facility] Rehabilitation [facility] [address]  [address] [place], [place], [address] Phone: [phone]   Fax:  [phone]  Physical Therapy Treatment  Patient Details  Name: Tyler Fletcher MRN: [id number] Date of Birth: March 22, 1955 Referring Provider (PT): Beverely Low, MD   Encounter Date: 05/23/2020   PT End of Session - 05/23/20 1744    Visit Number 6    Number of Visits 13    Date for PT Re-Evaluation 06/16/20    Authorization Type [employer]    Authorization Time Period 12 visits approved 04/07-05/19    Authorization - Visit Number 5    Authorization - Number of Visits 12    PT Start Time 1703    PT Stop Time 1743    PT Time Calculation (min) 40 min    Equipment Utilized During Treatment Gait belt    Activity Tolerance Patient tolerated treatment well    Behavior During Therapy [facility] for tasks assessed/performed           Past Medical History:  Diagnosis Date  . Blood pressure elevated without history of HTN 06/09/2016  . CAD (coronary artery disease), native coronary artery    06/12/16 PCI w/ DES -->mLAD  . Hyperlipidemia     Past Surgical History:  Procedure Laterality Date  . CORONARY STENT INTERVENTION N/A 06/12/2016   Procedure: Coronary Stent Intervention;  Surgeon: Lyn Records, MD;  Location: [facility] INVASIVE CV LAB;  Service: Cardiovascular;  Laterality: N/A;  . LEFT HEART CATH AND CORONARY ANGIOGRAPHY N/A 06/09/2016   Procedure: Left Heart Cath and Coronary Angiography;  Surgeon: Lyn Records, MD;  Location: [facility] INVASIVE CV LAB;  Service: Cardiovascular;  Laterality: N/A;  . None      There were no vitals filed for this visit.   Subjective Assessment - 05/23/20 1705    Subjective "I'm getting better." Having trouble getting comfortable at night.    Pertinent History HLD, CAD, HTN    Diagnostic tests none recent    Patient Stated Goals decrease pain    Currently in Pain? Yes    Pain Score 4     Pain Location Knee     Pain Orientation Left    Pain Descriptors / Indicators Aching;Sharp    Pain Type Acute pain;Surgical pain              [facility] PT Assessment - 05/23/20 0001      AROM   Left Knee Flexion 103      PROM   Left Knee Flexion 105                         [facility] Adult PT Treatment/Exercise - 05/23/20 0001      Ambulation/Gait   Stairs Yes    Stairs Assistance 5: Supervision;4: Min guard    Stair Management Technique Step to pattern;One rail Left;With cane    Number of Stairs 13    Height of Stairs 8    Gait Comments cues to proper SPC sequencing for max safety; cue      Knee/Hip Exercises: Stretches   Hip Flexor Stretch Left;30 seconds;2 reps    Hip Flexor Stretch Limitations mod thomas with strap    Gastroc Stretch Left;2 reps;30 seconds    Gastroc Stretch Limitations runner's stretch   reporting intense stretch     Knee/Hip Exercises: Aerobic   Recumbent Bike L1 x 6 min (partial revolutions); L1 x 3 min full revolutions  seat pushed all the way back     Knee/Hip Exercises: Standing   Terminal Knee Extension Strengthening;Left;1 set;10 reps;Theraband    Theraband Level (Terminal Knee Extension) Level 4 (Blue)    Terminal Knee Extension Limitations 10x3" with ski poles      Knee/Hip Exercises: Seated   Heel Slides AAROM;Left;1 set;10 reps    Heel Slides Limitations with strap and orange pball + OP from PT      Knee/Hip Exercises: Supine   Straight Leg Raise with External Rotation Strengthening;1 set;10 reps    Straight Leg Raise with External Rotation Limitations correction of positioning required   rest break after rep 6                 PT Education - 05/23/20 1744    Education Details update to HEP    Person(s) Educated Patient    Methods Explanation;Demonstration;Tactile cues;Verbal cues;Handout    Comprehension Verbalized understanding;Returned demonstration            PT Short Term Goals - 05/17/20 1737      PT SHORT TERM GOAL #1    Title Patient to be independent with initial HEP.    Time 3    Period Weeks    Status Achieved    Target Date 05/26/20             PT Long Term Goals - 05/11/20 0846      PT LONG TERM GOAL #1   Title Patient to be independent with advanced HEP.    Time 6    Period Weeks    Status On-going      PT LONG TERM GOAL #2   Title Patient to demonstrate L knee AROM 0-120 degrees.    Time 6    Period Weeks    Status On-going      PT LONG TERM GOAL #3   Title Patient to demonstrate B LE strength >/=4+/5.    Time 6    Period Weeks    Status On-going      PT LONG TERM GOAL #4   Title Patient to demonstrate SLR on L LE without quad lag evident.    Time 6    Period Weeks    Status On-going      PT LONG TERM GOAL #5   Title Patient to demonstrate symmetrical step length, knee flexion, and weight shift with LRAD.    Time 6    Period Weeks    Status On-going                 Plan - 05/23/20 1745    Clinical Impression Statement Patient arrived with report of improvement in the L knee, however reporting night pain. Encouraged patient to discuss pain management options with his MD- patient reported understanding. Worked on L knee flexion ROM with OP to tolerance. Patient was able to reach 103 deg AROM, 105 deg PROM R knee flexion after stretching. Patient also able to perform full revolution of bike after stretching. Assessed stairs with cues for safe SPC sequencing. Patient able to ascend and descend stairs with step-to pattern with L LE dominant pattern ascending, R LE dominant pattern descending and with good stability. Patient reported intense stretch with L gastroc stretch, thus updated this into HEP for hopeful improvement in L knee extension. Patient reported understanding of all edu provided today and without complaints at end of session.    Comorbidities HLD, CAD, HTN    PT Treatment/Interventions ADLs/Self Care Home  Management;Cryotherapy;Electrical  Stimulation;Iontophoresis 4mg /ml Dexamethasone;Moist Heat;Balance training;Therapeutic exercise;Therapeutic activities;Functional mobility training;Stair training;Gait training;Ultrasound;Neuromuscular re-education;Patient/family education;Manual techniques;Vasopneumatic Device;Taping;Energy conservation;Dry needling;Passive range of motion;Scar mobilization    PT Next Visit Plan progress L knee ROM and strengthening    Consulted and Agree with Plan of Care Patient           Patient will benefit from skilled therapeutic intervention in order to improve the following deficits and impairments:  Abnormal gait,Hypomobility,Increased edema,Decreased scar mobility,Decreased activity tolerance,Decreased strength,Increased fascial restricitons,Pain,Difficulty walking,Increased muscle spasms,Improper body mechanics,Decreased range of motion,Impaired flexibility,Postural dysfunction  Visit Diagnosis: Acute pain of left knee  Stiffness of left knee, not elsewhere classified  Muscle weakness (generalized)  Other abnormalities of gait and mobility     Problem List Patient Active Problem List   Diagnosis Date Noted  . Acute ST elevation myocardial infarction (STEMI) involving left anterior descending (LAD) coronary artery (HCC) 06/09/2016  . Hyperlipidemia LDL goal <70 06/09/2016  . STEMI (ST elevation myocardial infarction) (HCC) 06/09/2016  . Aborted myocardial infarction (HCC)   . Coronary artery disease involving native coronary artery of native heart with angina pectoris [facility])     [facility], PT, DPT 05/23/20 5:47 PM   [facility] [address]  [address] [place], [place], [place] Phone: [phone]   Fax:  [phone]  Name: Tyler Fletcher MRN: [name] Date of Birth: September 15, 1955

## 2020-05-24 ENCOUNTER — Encounter: Payer: Medicare HMO | Admitting: Physical Therapy

## 2020-05-27 ENCOUNTER — Other Ambulatory Visit: Payer: Self-pay

## 2020-05-27 ENCOUNTER — Ambulatory Visit: Payer: Medicare HMO | Admitting: Physical Therapy

## 2020-05-27 ENCOUNTER — Encounter: Payer: Self-pay | Admitting: Physical Therapy

## 2020-05-27 DIAGNOSIS — R2689 Other abnormalities of gait and mobility: Secondary | ICD-10-CM

## 2020-05-27 DIAGNOSIS — M25562 Pain in left knee: Secondary | ICD-10-CM | POA: Diagnosis not present

## 2020-05-27 DIAGNOSIS — M6281 Muscle weakness (generalized): Secondary | ICD-10-CM | POA: Diagnosis not present

## 2020-05-27 DIAGNOSIS — M25662 Stiffness of left knee, not elsewhere classified: Secondary | ICD-10-CM

## 2020-05-27 NOTE — Therapy (Signed)
[facility] Outpatient Rehabilitation [facility] [address]  [address] [place], [place], [address] Phone: [phone]   Fax:  [phone]  Physical Therapy Treatment  Patient Details  Name: Tyler Fletcher MRN: [id number] Date of Birth: [birth year] Referring Provider (PT): Beverely Low, MD   Encounter Date: 05/27/2020   PT End of Session - 05/27/20 1036    Visit Number 7    Number of Visits 13    Date for PT Re-Evaluation 06/16/20    Authorization Type [employer]    Authorization Time Period 12 visits approved 04/07-05/19    Authorization - Visit Number 6    Authorization - Number of Visits 12    PT Start Time 0815   pt late   PT Stop Time 0845    PT Time Calculation (min) 30 min    Activity Tolerance Patient tolerated treatment well;Patient limited by pain    Behavior During Therapy [facility] for tasks assessed/performed           Past Medical History:  Diagnosis Date  . Blood pressure elevated without history of HTN 06/09/2016  . CAD (coronary artery disease), native coronary artery    06/12/16 PCI w/ DES -->mLAD  . Hyperlipidemia     Past Surgical History:  Procedure Laterality Date  . CORONARY STENT INTERVENTION N/A 06/12/2016   Procedure: Coronary Stent Intervention;  Surgeon: Lyn Records, MD;  Location: [facility] INVASIVE CV LAB;  Service: Cardiovascular;  Laterality: N/A;  . LEFT HEART CATH AND CORONARY ANGIOGRAPHY N/A 06/09/2016   Procedure: Left Heart Cath and Coronary Angiography;  Surgeon: Lyn Records, MD;  Location: [facility] INVASIVE CV LAB;  Service: Cardiovascular;  Laterality: N/A;  . None      There were no vitals filed for this visit.   Subjective Assessment - 05/27/20 0817    Subjective Things have been "very good." Has been able to walk his dog recently.    Pertinent History HLD, CAD, HTN    Diagnostic tests none recent    Patient Stated Goals decrease pain    Currently in Pain? Yes    Pain Score 3     Pain Location Knee    Pain  Orientation Left    Pain Descriptors / Indicators Aching;Sharp    Pain Type Acute pain;Surgical pain                             [facility] Adult PT Treatment/Exercise - 05/27/20 0001      Knee/Hip Exercises: Stretches   Gastroc Stretch Left;2 reps;30 seconds    Gastroc Stretch Limitations runner's stretch   assistance to correct eversion   Other Knee/Hip Stretches L KTOS and fig 4 30" in siupine      Knee/Hip Exercises: Aerobic   Recumbent Bike L1 x 6 min full revolutions   seat back all the way     Knee/Hip Exercises: Machines for Strengthening   Cybex Leg Press B LEs 10x 35#, L LE 10x 15#   c/o medial knee pain with L LE only, able to continue     Knee/Hip Exercises: Standing   Functional Squat 1 set;15 reps    Functional Squat Limitations TRX squat   cues to increase L wt shift   SLS L SLS on foam 15"-30"   c/o L buttock pain   Other Standing Knee Exercises L TKE with ball at counter and 1 ski poles 10x3"   cues to maintain  chest up   Other Standing Knee Exercises L knee flexion stretch on step at counter 10x3"                  PT Education - 05/27/20 1035    Education Details update to HEP; edu on use of self-STM ball on wall massage to address L buttock pain    Person(s) Educated Patient    Methods Explanation;Demonstration;Tactile cues;Verbal cues    Comprehension Verbalized understanding            PT Short Term Goals - 05/17/20 1737      PT SHORT TERM GOAL #1   Title Patient to be independent with initial HEP.    Time 3    Period Weeks    Status Achieved    Target Date 05/26/20             PT Long Term Goals - 05/11/20 0846      PT LONG TERM GOAL #1   Title Patient to be independent with advanced HEP.    Time 6    Period Weeks    Status On-going      PT LONG TERM GOAL #2   Title Patient to demonstrate L knee AROM 0-120 degrees.    Time 6    Period Weeks    Status On-going      PT LONG TERM GOAL #3   Title Patient to  demonstrate B LE strength >/=4+/5.    Time 6    Period Weeks    Status On-going      PT LONG TERM GOAL #4   Title Patient to demonstrate SLR on L LE without quad lag evident.    Time 6    Period Weeks    Status On-going      PT LONG TERM GOAL #5   Title Patient to demonstrate symmetrical step length, knee flexion, and weight shift with LRAD.    Time 6    Period Weeks    Status On-going                 Plan - 05/27/20 1037    Clinical Impression Statement Patient arrived to session with report of being able to walk his dog recently and noting decreased pain levels. L knee incision appears well-healing. Worked on L knee flexion and extension stretching this AM. Patient able to demonstrate visibly improved knee flexion ROM. Patient still demonstrating limited gastroc flexibility with stretching, likely leading to limited TKE. Initiated leg press with B and L LEs with patient noting medial knee pain with L LE only, however able to continue. Patient still demonstrating slight R LE bias with squats today, requiring corrective cueing. Patient with c/o L buttock pain after standing ther-ex d/t hx of "sciatica." Worked on gentle L piriformis stretches which were well-tolerated and patient reported good benefit from. Also educated on self-STM to L buttock for pain relief. Patient reported understanding and without complaints at end of session.    Comorbidities HLD, CAD, HTN    PT Treatment/Interventions ADLs/Self Care Home Management;Cryotherapy;Electrical Stimulation;Iontophoresis 4mg /ml Dexamethasone;Moist Heat;Balance training;Therapeutic exercise;Therapeutic activities;Functional mobility training;Stair training;Gait training;Ultrasound;Neuromuscular re-education;Patient/family education;Manual techniques;Vasopneumatic Device;Taping;Energy conservation;Dry needling;Passive range of motion;Scar mobilization    PT Next Visit Plan progress L knee ROM and strengthening    Consulted and Agree  with Plan of Care Patient           Patient will benefit from skilled therapeutic intervention in order to improve the following deficits and impairments:  Abnormal  gait,Hypomobility,Increased edema,Decreased scar mobility,Decreased activity tolerance,Decreased strength,Increased fascial restricitons,Pain,Difficulty walking,Increased muscle spasms,Improper body mechanics,Decreased range of motion,Impaired flexibility,Postural dysfunction  Visit Diagnosis: Acute pain of left knee  Stiffness of left knee, not elsewhere classified  Muscle weakness (generalized)  Other abnormalities of gait and mobility     Problem List Patient Active Problem List   Diagnosis Date Noted  . Acute ST elevation myocardial infarction (STEMI) involving left anterior descending (LAD) coronary artery (HCC) 06/09/2016  . Hyperlipidemia LDL goal <70 06/09/2016  . STEMI (ST elevation myocardial infarction) (HCC) 06/09/2016  . Aborted myocardial infarction (HCC)   . Coronary artery disease involving native coronary artery of native heart with angina pectoris [facility])     Anette Guarneri, PT, DPT 05/27/20 10:41 AM    [facility] [address]  [address] [place], [place], [address] Phone: [phone]   Fax:  [phone]  Name: Tyler Fletcher MRN: [id number] Date of Birth: [birth year]

## 2020-05-31 ENCOUNTER — Other Ambulatory Visit: Payer: Self-pay

## 2020-05-31 ENCOUNTER — Ambulatory Visit: Payer: Medicare HMO | Attending: Orthopedic Surgery

## 2020-05-31 DIAGNOSIS — M25662 Stiffness of left knee, not elsewhere classified: Secondary | ICD-10-CM | POA: Diagnosis not present

## 2020-05-31 DIAGNOSIS — M6281 Muscle weakness (generalized): Secondary | ICD-10-CM

## 2020-05-31 DIAGNOSIS — M62838 Other muscle spasm: Secondary | ICD-10-CM | POA: Insufficient documentation

## 2020-05-31 DIAGNOSIS — R2689 Other abnormalities of gait and mobility: Secondary | ICD-10-CM | POA: Diagnosis not present

## 2020-05-31 DIAGNOSIS — M25562 Pain in left knee: Secondary | ICD-10-CM | POA: Diagnosis not present

## 2020-05-31 NOTE — Therapy (Signed)
[facility]patient [facility] [address]  [address] [place], [place], [address] Phone: [phone]   Fax:  [phone]  Physical Therapy Treatment  Patient Details  Name: Tyler Fletcher MRN: [id number] Date of Birth: 12/05/1955 Referring Provider (PT): Beverely Low, MD   Encounter Date: 05/31/2020   PT End of Session - 05/31/20 1747    Visit Number 8    Number of Visits 13    Date for PT Re-Evaluation 06/16/20    Authorization Type [employer]    Authorization Time Period 12 visits approved 04/07-05/19    Authorization - Visit Number 7    Authorization - Number of Visits 12    PT Start Time 1706   pt late   PT Stop Time 1744    PT Time Calculation (min) 38 min    Activity Tolerance Patient tolerated treatment well    Behavior During Therapy [facility] for tasks assessed/performed           Past Medical History:  Diagnosis Date  . Blood pressure elevated without history of HTN 06/09/2016  . CAD (coronary artery disease), native coronary artery    06/12/16 PCI w/ DES -->mLAD  . Hyperlipidemia     Past Surgical History:  Procedure Laterality Date  . CORONARY STENT INTERVENTION N/A 06/12/2016   Procedure: Coronary Stent Intervention;  Surgeon: Lyn Records, MD;  Location: [facility] INVASIVE CV LAB;  Service: Cardiovascular;  Laterality: N/A;  . LEFT HEART CATH AND CORONARY ANGIOGRAPHY N/A 06/09/2016   Procedure: Left Heart Cath and Coronary Angiography;  Surgeon: Lyn Records, MD;  Location: [facility] INVASIVE CV LAB;  Service: Cardiovascular;  Laterality: N/A;  . None      There were no vitals filed for this visit.   Subjective Assessment - 05/31/20 1709    Subjective Pt is doing well but does report not sleeping well last night.    Pertinent History HLD, CAD, HTN    Diagnostic tests none recent    Patient Stated Goals decrease pain    Currently in Pain? Yes    Pain Score 4     Pain Location Knee    Pain Orientation Left    Pain  Descriptors / Indicators Aching    Pain Type Acute pain;Surgical pain              [facility] PT Assessment - 05/31/20 0001      AROM   Left Knee Extension 3    Left Knee Flexion 106                         [facility] Adult PT Treatment/Exercise - 05/31/20 0001      Knee/Hip Exercises: Aerobic   Recumbent Bike L1x45min      Knee/Hip Exercises: Standing   Lateral Step Up Left;1 set;10 reps;Hand Hold: 0;Step Height: 6"    Forward Step Up Left;10 reps;Hand Hold: 0;Step Height: 6";2 sets    Functional Squat 2 sets;10 reps    Functional Squat Limitations 2 HHA at treadmill      Knee/Hip Exercises: Seated   Sit to Sand 1 set;10 reps;with UE support   cues to increase WS to L side     Manual Therapy   Manual Therapy Joint mobilization;Passive ROM    Joint Mobilization tibiofemoral flexion mobs grades II-III    Passive ROM knee flex and ext  PT Short Term Goals - 05/17/20 1737      PT SHORT TERM GOAL #1   Title Patient to be independent with initial HEP.    Time 3    Period Weeks    Status Achieved    Target Date 05/26/20             PT Long Term Goals - 05/11/20 0846      PT LONG TERM GOAL #1   Title Patient to be independent with advanced HEP.    Time 6    Period Weeks    Status On-going      PT LONG TERM GOAL #2   Title Patient to demonstrate L knee AROM 0-120 degrees.    Time 6    Period Weeks    Status On-going      PT LONG TERM GOAL #3   Title Patient to demonstrate B LE strength >/=4+/5.    Time 6    Period Weeks    Status On-going      PT LONG TERM GOAL #4   Title Patient to demonstrate SLR on L LE without quad lag evident.    Time 6    Period Weeks    Status On-going      PT LONG TERM GOAL #5   Title Patient to demonstrate symmetrical step length, knee flexion, and weight shift with LRAD.    Time 6    Period Weeks    Status On-going                 Plan - 05/31/20 1748    Clinical Impression  Statement Pt still noting L medial knee pain, he has been rubbing it which seems to be helping. Was able to complete steps w/o UE support with good stability. Lots of cueing required today to shift weight equally through LEs during squats and STS. Also cues to correctly perform squats preventing knees over toes. Pt still demonstrating decreased TKE but improvements with flexion post MT. No complaints from pt during session.    Personal Factors and Comorbidities Age;Comorbidity 3+;Fitness;Past/Current Experience;Time since onset of injury/illness/exacerbation    Comorbidities HLD, CAD, HTN    PT Frequency 2x / week    PT Duration 6 weeks    PT Treatment/Interventions ADLs/Self Care Home Management;Cryotherapy;Electrical Stimulation;Iontophoresis 4mg /ml Dexamethasone;Moist Heat;Balance training;Therapeutic exercise;Therapeutic activities;Functional mobility training;Stair training;Gait training;Ultrasound;Neuromuscular re-education;Patient/family education;Manual techniques;Vasopneumatic Device;Taping;Energy conservation;Dry needling;Passive range of motion;Scar mobilization    PT Next Visit Plan progress L knee ROM and strengthening    Consulted and Agree with Plan of Care Patient           Patient will benefit from skilled therapeutic intervention in order to improve the following deficits and impairments:  Abnormal gait,Hypomobility,Increased edema,Decreased scar mobility,Decreased activity tolerance,Decreased strength,Increased fascial restricitons,Pain,Difficulty walking,Increased muscle spasms,Improper body mechanics,Decreased range of motion,Impaired flexibility,Postural dysfunction  Visit Diagnosis: Acute pain of left knee  Stiffness of left knee, not elsewhere classified  Muscle weakness (generalized)  Other abnormalities of gait and mobility     Problem List Patient Active Problem List   Diagnosis Date Noted  . Acute ST elevation myocardial infarction (STEMI) involving left  anterior descending (LAD) coronary artery (HCC) 06/09/2016  . Hyperlipidemia LDL goal <70 06/09/2016  . STEMI (ST elevation myocardial infarction) (HCC) 06/09/2016  . Aborted myocardial infarction (HCC)   . Coronary artery disease involving native coronary artery of native heart with angina pectoris (HCC)     08/09/2016, PTA 05/31/2020, 6:15 PM  [facility] [address]  [address] [place], [place], [address] Phone: [phone]   Fax:  [phone]  Name: Tyler Fletcher MRN: [id number] Date of Birth: Sep 18, 1955

## 2020-06-03 ENCOUNTER — Other Ambulatory Visit: Payer: Self-pay

## 2020-06-03 ENCOUNTER — Ambulatory Visit: Payer: Medicare HMO

## 2020-06-03 DIAGNOSIS — M6281 Muscle weakness (generalized): Secondary | ICD-10-CM

## 2020-06-03 DIAGNOSIS — M62838 Other muscle spasm: Secondary | ICD-10-CM | POA: Diagnosis not present

## 2020-06-03 DIAGNOSIS — M25562 Pain in left knee: Secondary | ICD-10-CM | POA: Diagnosis not present

## 2020-06-03 DIAGNOSIS — M25662 Stiffness of left knee, not elsewhere classified: Secondary | ICD-10-CM

## 2020-06-03 DIAGNOSIS — R2689 Other abnormalities of gait and mobility: Secondary | ICD-10-CM

## 2020-06-03 NOTE — Therapy (Signed)
[facility] Outpatient Rehabilitation [facility] [address]  [address] [place], [place], [address] Phone: [phone]   Fax:  [phone]  Physical Therapy Treatment  Patient Details  Name: Tyler Fletcher MRN: [id number] Date of Birth: 01-04-1956 Referring Provider (PT): Beverely Low, MD   Encounter Date: 06/03/2020   PT End of Session - 06/03/20 0805    Visit Number 9    Number of Visits 13    Date for PT Re-Evaluation 06/16/20    Authorization Type [employer]    Authorization Time Period 12 visits approved 04/07-05/19    Authorization - Visit Number 7    Authorization - Number of Visits 12    PT Start Time 0800    PT Stop Time 0844    PT Time Calculation (min) 44 min    Activity Tolerance Patient tolerated treatment well    Behavior During Therapy [facility] for tasks assessed/performed           Past Medical History:  Diagnosis Date  . Blood pressure elevated without history of HTN 06/09/2016  . CAD (coronary artery disease), native coronary artery    06/12/16 PCI w/ DES -->mLAD  . Hyperlipidemia     Past Surgical History:  Procedure Laterality Date  . CORONARY STENT INTERVENTION N/A 06/12/2016   Procedure: Coronary Stent Intervention;  Surgeon: Lyn Records, MD;  Location: [facility] INVASIVE CV LAB;  Service: Cardiovascular;  Laterality: N/A;  . LEFT HEART CATH AND CORONARY ANGIOGRAPHY N/A 06/09/2016   Procedure: Left Heart Cath and Coronary Angiography;  Surgeon: Lyn Records, MD;  Location: [facility] INVASIVE CV LAB;  Service: Cardiovascular;  Laterality: N/A;  . None      There were no vitals filed for this visit.   Subjective Assessment - 06/03/20 0805    Subjective Pt reports "he was worked pretty good, went straight home and put ice on it".    Pertinent History HLD, CAD, HTN    Diagnostic tests none recent    Patient Stated Goals decrease pain    Currently in Pain? Yes    Pain Score 4     Pain Location Knee    Pain Orientation Left    Pain  Descriptors / Indicators Aching    Pain Type Acute pain;Surgical pain              [facility] PT Assessment - 06/03/20 0001      AROM   Left Knee Extension 3    Left Knee Flexion 109   116 post manual therapy                        [facility] Adult PT Treatment/Exercise - 06/03/20 0001      Self-Care   Self-Care Other Self-Care Comments    Other Self-Care Comments  see pt edu      Knee/Hip Exercises: Aerobic   Recumbent Bike L2x38min      Knee/Hip Exercises: Standing   Wall Squat 10 reps    Wall Squat Limitations ball ADD 5"   verbal cues for weight shift to L due to compensation   Stairs 2 flights slow concentric and eccentric for incr quad control    Gait Training heel to toe focus      Knee/Hip Exercises: Supine   Quad Sets Both;1 set;10 reps   5"   Quad Sets Limitations towel roll    Straight Leg Raises Left;1 set;10 reps    Straight Leg Raises Limitations  small ROM      Knee/Hip Exercises: Sidelying   Clams L x20 RTB      Manual Therapy   Manual Therapy Joint mobilization;Passive ROM    Joint Mobilization end range knee flexion with tib-fem mobs grade 4, towel roll under knee AP femur mobs with femoral IR and tibial ER    Passive ROM knee flex and ext                    PT Short Term Goals - 05/17/20 1737      PT SHORT TERM GOAL #1   Title Patient to be independent with initial HEP.    Time 3    Period Weeks    Status Achieved    Target Date 05/26/20             PT Long Term Goals - 05/11/20 0846      PT LONG TERM GOAL #1   Title Patient to be independent with advanced HEP.    Time 6    Period Weeks    Status On-going      PT LONG TERM GOAL #2   Title Patient to demonstrate L knee AROM 0-120 degrees.    Time 6    Period Weeks    Status On-going      PT LONG TERM GOAL #3   Title Patient to demonstrate B LE strength >/=4+/5.    Time 6    Period Weeks    Status On-going      PT LONG TERM GOAL #4   Title Patient to  demonstrate SLR on L LE without quad lag evident.    Time 6    Period Weeks    Status On-going      PT LONG TERM GOAL #5   Title Patient to demonstrate symmetrical step length, knee flexion, and weight shift with LRAD.    Time 6    Period Weeks    Status On-going                 Plan - 06/03/20 0806    Clinical Impression Statement Pt continues to have medial knee pain, likely from lack of quad girth and strength, as well as L hip weakness. Educated pt on continuing to perform quad sets and straight leg raise at home, based on difficulty and increased VMP activation. Additionally, added sidelyng clamshells to HEP for increased outer hip strength. Therapeutic activities were performed to increase HKA alignment, as needed for optimal quad and glute activation in wall squatting and stairs training.    Personal Factors and Comorbidities Age;Comorbidity 3+;Fitness;Past/Current Experience;Time since onset of injury/illness/exacerbation    Comorbidities HLD, CAD, HTN    PT Frequency 2x / week    PT Duration 6 weeks    PT Treatment/Interventions ADLs/Self Care Home Management;Cryotherapy;Electrical Stimulation;Iontophoresis 4mg /ml Dexamethasone;Moist Heat;Balance training;Therapeutic exercise;Therapeutic activities;Functional mobility training;Stair training;Gait training;Ultrasound;Neuromuscular re-education;Patient/family education;Manual techniques;Vasopneumatic Device;Taping;Energy conservation;Dry needling;Passive range of motion;Scar mobilization    PT Next Visit Plan progress L knee ROM and strengthening    Consulted and Agree with Plan of Care Patient           Patient will benefit from skilled therapeutic intervention in order to improve the following deficits and impairments:  Abnormal gait,Hypomobility,Increased edema,Decreased scar mobility,Decreased activity tolerance,Decreased strength,Increased fascial restricitons,Pain,Difficulty walking,Increased muscle spasms,Improper body  mechanics,Decreased range of motion,Impaired flexibility,Postural dysfunction  Visit Diagnosis: Acute pain of left knee  Stiffness of left knee, not elsewhere classified  Muscle weakness (generalized)  Other  abnormalities of gait and mobility     Problem List Patient Active Problem List   Diagnosis Date Noted  . Acute ST elevation myocardial infarction (STEMI) involving left anterior descending (LAD) coronary artery (HCC) 06/09/2016  . Hyperlipidemia LDL goal <70 06/09/2016  . STEMI (ST elevation myocardial infarction) (HCC) 06/09/2016  . Aborted myocardial infarction (HCC)   . Coronary artery disease involving native coronary artery of native heart with angina pectoris (HCC)     Marcelline Mates, PT, DPT 06/03/2020, 10:15 AM  [facility] [address]  [address] [place], [place], [address] Phone: [phone]   Fax:  [phone]  Name: Tyler Fletcher MRN: [id number] Date of Birth: [birth year]

## 2020-06-10 ENCOUNTER — Other Ambulatory Visit: Payer: Self-pay

## 2020-06-10 ENCOUNTER — Encounter: Payer: Self-pay | Admitting: Physical Therapy

## 2020-06-10 ENCOUNTER — Ambulatory Visit: Payer: Medicare HMO | Admitting: Physical Therapy

## 2020-06-10 DIAGNOSIS — R2689 Other abnormalities of gait and mobility: Secondary | ICD-10-CM

## 2020-06-10 DIAGNOSIS — M25562 Pain in left knee: Secondary | ICD-10-CM

## 2020-06-10 DIAGNOSIS — M62838 Other muscle spasm: Secondary | ICD-10-CM

## 2020-06-10 DIAGNOSIS — M6281 Muscle weakness (generalized): Secondary | ICD-10-CM | POA: Diagnosis not present

## 2020-06-10 DIAGNOSIS — M25662 Stiffness of left knee, not elsewhere classified: Secondary | ICD-10-CM | POA: Diagnosis not present

## 2020-06-10 NOTE — Therapy (Signed)
[facility] [address]  [place], [place], [address] Phone: [phone]   Fax:  [phone]  Physical Therapy Progress Note  Patient Details  Name: Tyler Fletcher MRN: [id number] Date of Birth: 25-Oct-1955 Referring Provider (PT): Netta Cedars, MD  Progress Note Reporting Period 05/05/20 to 06/10/20  See note below for Objective Data and Assessment of Progress/Goals.     Encounter Date: 06/10/2020   PT End of Session - 06/10/20 0847    Visit Number 10    Number of Visits 16    Date for PT Re-Evaluation 07/22/20    Authorization Type [employer]    Authorization Time Period 12 visits approved 04/07-05/19    Authorization - Visit Number 8    Authorization - Number of Visits 12    PT Start Time 0802    PT Stop Time 0017    PT Time Calculation (min) 42 min    Activity Tolerance Patient tolerated treatment well;Patient limited by pain    Behavior During Therapy [facility] for tasks assessed/performed           Past Medical History:  Diagnosis Date  . Blood pressure elevated without history of HTN 06/09/2016  . CAD (coronary artery disease), native coronary artery    06/12/16 PCI w/ DES -->mLAD  . Hyperlipidemia     Past Surgical History:  Procedure Laterality Date  . CORONARY STENT INTERVENTION N/A 06/12/2016   Procedure: Coronary Stent Intervention;  Surgeon: Belva Crome, MD;  Location: [place] CV LAB;  Service: Cardiovascular;  Laterality: N/A;  . LEFT HEART CATH AND CORONARY ANGIOGRAPHY N/A 06/09/2016   Procedure: Left Heart Cath and Coronary Angiography;  Surgeon: Belva Crome, MD;  Location: [place] CV LAB;  Service: Cardiovascular;  Laterality: N/A;  . None      There were no vitals filed for this visit.   Subjective Assessment - 06/10/20 0804    Subjective Feels like his "sciatica" is flared up without known cause. Has trouble getting comfortable and walking quickly. Pain is located over  the L buttock with radiation down to the foot. Reports mild tingling down to the foot. Denies B&B changes. Reports 55-60% improvement in L knee. Feels that he still has stiffness in the L knee.    Pertinent History HLD, CAD, HTN    Diagnostic tests none recent    Patient Stated Goals decrease pain    Currently in Pain? Yes    Pain Score 6     Pain Location Knee    Pain Orientation Left    Pain Descriptors / Indicators Aching    Pain Type Acute pain;Surgical pain    Multiple Pain Sites Yes    Pain Score 5    Pain Location Buttocks    Pain Orientation Left    Pain Descriptors / Indicators Aching;Throbbing    Pain Type Acute pain    Pain Radiating Towards L foot              [facility] PT Assessment - 06/10/20 0001      Assessment   Medical Diagnosis Presence of L artificial knee joint    Referring Provider (PT) Netta Cedars, MD    Onset Date/Surgical Date 05/02/20      AROM   Left Knee Extension 3    Left Knee Flexion 115      PROM   Left Knee Extension 1    Left Knee Flexion 119  Strength   Right Hip Flexion 4+/5    Right Hip ABduction 4+/5    Right Hip ADduction 4/5    Left Hip Flexion 4+/5    Left Hip ABduction 4+/5    Left Hip ADduction 4/5    Right Knee Flexion 4+/5    Right Knee Extension 5/5    Left Knee Flexion 4/5    Left Knee Extension 4+/5    Right Ankle Dorsiflexion 4+/5    Right Ankle Plantar Flexion 4+/5    Left Ankle Dorsiflexion 4+/5    Left Ankle Plantar Flexion 4+/5      Palpation   Palpation comment increased soft tissue restriction and TTP in L piriformis; no TTP in LB or remainder of buttock                         [facility] Adult PT Treatment/Exercise - 06/10/20 0001      Ambulation/Gait   Ambulation Distance (Feet) 90 Feet    Assistive device None    Gait Pattern Trunk flexed;Step-through pattern;Lateral trunk lean to left;Left flexed knee in stance   decreased L TKE at heel strike   Ambulation Surface Level;Indoor     Gait velocity [facility]      Self-Care   Self-Care Other Self-Care Comments    Other Self-Care Comments  edu and practice using self-STM with ball on wall with L buttock      Knee/Hip Exercises: Stretches   Passive Hamstring Stretch Left;2 reps;30 seconds    Passive Hamstring Stretch Limitations sitting    Other Knee/Hip Stretches L knee flexion stretch on step 10x5"    Other Knee/Hip Stretches L KTOS and fig 4 2x30" in supine   cues to avoid pushing into pain     Knee/Hip Exercises: Aerobic   Recumbent Bike L1x47mn      Knee/Hip Exercises: Supine   Straight Leg Raises Left;1 set;5 reps    Straight Leg Raises Limitations limited ROM; small squad lag evident      Manual Therapy   Manual Therapy Myofascial release;Soft tissue mobilization    Manual therapy comments prone    Soft tissue mobilization STM/DTM to L piriformis    Myofascial Release manual TPR to L piriformis   palpable trigger point with frequent twitch response                 PT Education - 06/10/20 0846    Education Details update to HEP; edu on self-STm to L buttock and use of heat for pain-relief    Person(s) Educated Patient    Methods Explanation;Demonstration;Tactile cues;Verbal cues;Handout    Comprehension Verbalized understanding;Returned demonstration            PT Short Term Goals - 06/10/20 03016     PT SHORT TERM GOAL #1   Title Patient to be independent with initial HEP.    Time 3    Period Weeks    Status Achieved    Target Date 05/26/20             PT Long Term Goals - 06/10/20 0808      PT LONG TERM GOAL #1   Title Patient to be independent with advanced HEP.    Time 6    Period Weeks    Status Partially Met   met for current   Target Date 07/22/20      PT LONG TERM GOAL #2   Title Patient to demonstrate L knee AROM 0-120  degrees.    Time 6    Period Weeks    Status On-going   AROM is now reaching 3-115 deg, PROM 1-119 deg   Target Date 07/22/20      PT LONG TERM GOAL  #3   Title Patient to demonstrate B LE strength >/=4+/5.    Time 6    Period Weeks    Status Partially Met   remaining limitations evident in B hip adduction and L knee flexion.   Target Date 07/22/20      PT LONG TERM GOAL #4   Title Patient to demonstrate SLR on L LE without quad lag evident.    Time 6    Period Weeks    Status On-going   mild quad lag remaining   Target Date 07/22/20      PT LONG TERM GOAL #5   Title Patient to demonstrate symmetrical step length, knee flexion, and weight shift with LRAD.    Time 6    Period Weeks    Status Partially Met   heavy L lateral trunk lean and decreased L TKE at heel strike without AD   Target Date 07/22/20                 Plan - 06/10/20 0847    Clinical Impression Statement Patient arrived to session with report of 55-60% improvement in L knee, with some remaining stiffness limiting him. Also reports that for the the past couple weeks he has been experiencing "sciatica" from the L buttock radiating down the to foot with intermittent tingling in the same distribution. Worse with walking. Strength testing revealed improvement in B hip, knee, and ankle strength, with remaining limitations evident in B hip adduction and L knee flexion. Patient is able to perform SLR with mild remaining quad lag. AROM is now reaching 3-115 deg, PROM 1-119 deg, demonstrating good improvement. Patient's gait pattern revealed heavy L lateral trunk lean and decreased L TKE at heel strike without AD. Patient attributes gait pattern to his L buttock pain. Worked on addressing pain with MT, with patient demonstrating considerable soft tissue restriction and TTP in L piriformis with frequent twitch response and excellent improvement in tightness after MT. Provided cues to avoid pushing into pain with piriformis stretching d/t c/o intense stretch. Also educated patient on self-STM to address tightness at home. Updated stretches into HEP- patient reported  understanding. Patient is demonstrating good progress towards goals despite flare of L buttock pain. Would benefit from additional skilled PT services 1x/week for 6 weeks to address remaining goals.    Personal Factors and Comorbidities Age;Comorbidity 3+;Fitness;Past/Current Experience;Time since onset of injury/illness/exacerbation    Comorbidities HLD, CAD, HTN    PT Frequency 1x / week    PT Duration 6 weeks    PT Treatment/Interventions ADLs/Self Care Home Management;Cryotherapy;Electrical Stimulation;Iontophoresis 36m/ml Dexamethasone;Moist Heat;Balance training;Therapeutic exercise;Therapeutic activities;Functional mobility training;Stair training;Gait training;Ultrasound;Neuromuscular re-education;Patient/family education;Manual techniques;Vasopneumatic Device;Taping;Energy conservation;Dry needling;Passive range of motion;Scar mobilization    PT Next Visit Plan progress L knee ROM and strengthening    Consulted and Agree with Plan of Care Patient           Patient will benefit from skilled therapeutic intervention in order to improve the following deficits and impairments:  Abnormal gait,Hypomobility,Increased edema,Decreased scar mobility,Decreased activity tolerance,Decreased strength,Increased fascial restricitons,Pain,Difficulty walking,Increased muscle spasms,Improper body mechanics,Decreased range of motion,Impaired flexibility,Postural dysfunction  Visit Diagnosis: Acute pain of left knee  Stiffness of left knee, not elsewhere classified  Muscle weakness (generalized)  Other abnormalities of  gait and mobility  Other muscle spasm     Problem List Patient Active Problem List   Diagnosis Date Noted  . Acute ST elevation myocardial infarction (STEMI) involving left anterior descending (LAD) coronary artery ([place]) 06/09/2016  . Hyperlipidemia LDL goal <70 06/09/2016  . STEMI (ST elevation myocardial infarction) ([place]) 06/09/2016  . Aborted myocardial infarction ([place])   .  Coronary artery disease involving native coronary artery of native heart with angina pectoris [facility])      Janene Harvey, PT, DPT 06/10/20 8:51 AM   [facility] [address]  [place], [place], [address] Phone: [phone]   Fax:  [phone]  Name: Tyler Fletcher MRN: [id number] Date of Birth: 14-Feb-1955

## 2020-06-14 ENCOUNTER — Other Ambulatory Visit: Payer: Self-pay

## 2020-06-14 ENCOUNTER — Ambulatory Visit: Payer: Medicare HMO

## 2020-06-14 DIAGNOSIS — R2689 Other abnormalities of gait and mobility: Secondary | ICD-10-CM | POA: Diagnosis not present

## 2020-06-14 DIAGNOSIS — M6281 Muscle weakness (generalized): Secondary | ICD-10-CM

## 2020-06-14 DIAGNOSIS — M25562 Pain in left knee: Secondary | ICD-10-CM

## 2020-06-14 DIAGNOSIS — M25662 Stiffness of left knee, not elsewhere classified: Secondary | ICD-10-CM | POA: Diagnosis not present

## 2020-06-14 DIAGNOSIS — M62838 Other muscle spasm: Secondary | ICD-10-CM | POA: Diagnosis not present

## 2020-06-14 NOTE — Therapy (Signed)
[facility] [address]  [place], [place], [address] Phone: [phone]   Fax:  [phone]  Physical Therapy Treatment  Patient Details  Name: Tyler Fletcher MRN: [id number] Date of Birth: [birth year] Referring Provider (PT): Netta Cedars, MD   Encounter Date: 06/14/2020   PT End of Session - 06/14/20 1800    Visit Number 11    Number of Visits 16    Date for PT Re-Evaluation 07/22/20    Authorization Type [employer]    Authorization Time Period 6 visits approved from 05/16- 06/24    Authorization - Visit Number 1    Authorization - Number of Visits 6    PT Start Time 1703    PT Stop Time 1743    PT Time Calculation (min) 40 min    Activity Tolerance Patient tolerated treatment well    Behavior During Therapy [facility] for tasks assessed/performed           Past Medical History:  Diagnosis Date  . Blood pressure elevated without history of HTN 06/09/2016  . CAD (coronary artery disease), native coronary artery    06/12/16 PCI w/ DES -->mLAD  . Hyperlipidemia     Past Surgical History:  Procedure Laterality Date  . CORONARY STENT INTERVENTION N/A 06/12/2016   Procedure: Coronary Stent Intervention;  Surgeon: Belva Crome, MD;  Location: [place] [facility];  Service: Cardiovascular;  Laterality: N/A;  . LEFT HEART CATH AND CORONARY ANGIOGRAPHY N/A 06/09/2016   Procedure: Left Heart Cath and Coronary Angiography;  Surgeon: Belva Crome, MD;  Location: [place] [facility];  Service: Cardiovascular;  Laterality: N/A;  . None      There were no vitals filed for this visit.   Subjective Assessment - 06/14/20 1705    Subjective Pt reports his knee is doing better. MD says that he doing better and return in 6 weeks.    Pertinent History HLD, CAD, HTN    Diagnostic tests none recent    Patient Stated Goals decrease pain    Currently in Pain? Yes    Pain Score 2     Pain Location Knee    Pain Orientation  Left    Pain Descriptors / Indicators Aching    Pain Type Acute pain;Surgical pain                             [facility] Adult PT Treatment/Exercise - 06/14/20 0001      Exercises   Exercises Knee/Hip      Knee/Hip Exercises: Aerobic   Recumbent Bike L3x88mn      Knee/Hip Exercises: Machines for Strengthening   Cybex Knee Extension 15# B LE 10 reps    Cybex Knee Flexion 35# B LE 10 reps    Cybex Leg Press 35# B LE 10 reps, 15# L LE 10 reps      Knee/Hip Exercises: Standing   Other Standing Knee Exercises sumo squat with yellow medicine ball 5 reps      Knee/Hip Exercises: Supine   Straight Leg Raises Strengthening;Left;2 sets;10 reps    Straight Leg Raises Limitations little quad lag      Knee/Hip Exercises: Prone   Hamstring Curl 2 sets;10 reps    Hamstring Curl Limitations 2# weights                    PT Short Term Goals - 06/10/20 03716  PT SHORT TERM GOAL #1   Title Patient to be independent with initial HEP.    Time 3    Period Weeks    Status Achieved    Target Date 05/26/20             PT Long Term Goals - 06/10/20 0808      PT LONG TERM GOAL #1   Title Patient to be independent with advanced HEP.    Time 6    Period Weeks    Status Partially Met   met for current   Target Date 07/22/20      PT LONG TERM GOAL #2   Title Patient to demonstrate L knee AROM 0-120 degrees.    Time 6    Period Weeks    Status On-going   AROM is now reaching 3-115 deg, PROM 1-119 deg   Target Date 07/22/20      PT LONG TERM GOAL #3   Title Patient to demonstrate B LE strength >/=4+/5.    Time 6    Period Weeks    Status Partially Met   remaining limitations evident in B hip adduction and L knee flexion.   Target Date 07/22/20      PT LONG TERM GOAL #4   Title Patient to demonstrate SLR on L LE without quad lag evident.    Time 6    Period Weeks    Status On-going   mild quad lag remaining   Target Date 07/22/20      PT LONG TERM  GOAL #5   Title Patient to demonstrate symmetrical step length, knee flexion, and weight shift with LRAD.    Time 6    Period Weeks    Status Partially Met   heavy L lateral trunk lean and decreased L TKE at heel strike without AD   Target Date 07/22/20                 Plan - 06/14/20 1802    Clinical Impression Statement Pt is doing well with everything. Reports that the doctor is pleased and wants him to continue with PT. Cues given during all exercises to protect the knees and correctly perform the exercises. He is showing good and stable gait w/o SPC with no visual deviations noted. He reports the sciatica pain comes and goes, he wasn't having too much trouble from it today. He is showing improvements with increasing tolerance for exercises and knee strength. Pt responded well today.    Personal Factors and Comorbidities Age;Comorbidity 3+;Fitness;Past/Current Experience;Time since onset of injury/illness/exacerbation    Comorbidities HLD, CAD, HTN    PT Frequency 1x / week    PT Duration 6 weeks    PT Treatment/Interventions ADLs/Self Care Home Management;Cryotherapy;Electrical Stimulation;Iontophoresis 26m/ml Dexamethasone;Moist Heat;Balance training;Therapeutic exercise;Therapeutic activities;Functional mobility training;Stair training;Gait training;Ultrasound;Neuromuscular re-education;Patient/family education;Manual techniques;Vasopneumatic Device;Taping;Energy conservation;Dry needling;Passive range of motion;Scar mobilization    PT Next Visit Plan progress L knee ROM and strengthening    Consulted and Agree with Plan of Care Patient           Patient will benefit from skilled therapeutic intervention in order to improve the following deficits and impairments:  Abnormal gait,Hypomobility,Increased edema,Decreased scar mobility,Decreased activity tolerance,Decreased strength,Increased fascial restricitons,Pain,Difficulty walking,Increased muscle spasms,Improper body  mechanics,Decreased range of motion,Impaired flexibility,Postural dysfunction  Visit Diagnosis: Acute pain of left knee  Stiffness of left knee, not elsewhere classified  Muscle weakness (generalized)  Other abnormalities of gait and mobility  Other muscle spasm  Problem List Patient Active Problem List   Diagnosis Date Noted  . Acute ST elevation myocardial infarction (STEMI) involving left anterior descending (LAD) coronary artery ([place]) 06/09/2016  . Hyperlipidemia LDL goal <70 06/09/2016  . STEMI (ST elevation myocardial infarction) ([place]) 06/09/2016  . Aborted myocardial infarction ([place])   . Coronary artery disease involving native coronary artery of native heart with angina pectoris ([place])     Artist Pais, PTA 06/14/2020, 6:06 PM  [facility] [address]  [place], [place], [address] Phone: [phone]   Fax:  [phone]  Name: Tyler Fletcher MRN: [id number] Date of Birth: [birth year]

## 2020-06-21 ENCOUNTER — Ambulatory Visit: Payer: Medicare HMO | Admitting: Physical Therapy

## 2020-06-21 ENCOUNTER — Other Ambulatory Visit: Payer: Self-pay

## 2020-06-21 ENCOUNTER — Encounter: Payer: Self-pay | Admitting: Physical Therapy

## 2020-06-21 DIAGNOSIS — M62838 Other muscle spasm: Secondary | ICD-10-CM | POA: Diagnosis not present

## 2020-06-21 DIAGNOSIS — R2689 Other abnormalities of gait and mobility: Secondary | ICD-10-CM | POA: Diagnosis not present

## 2020-06-21 DIAGNOSIS — M6281 Muscle weakness (generalized): Secondary | ICD-10-CM

## 2020-06-21 DIAGNOSIS — M25562 Pain in left knee: Secondary | ICD-10-CM

## 2020-06-21 DIAGNOSIS — M25662 Stiffness of left knee, not elsewhere classified: Secondary | ICD-10-CM | POA: Diagnosis not present

## 2020-06-21 NOTE — Therapy (Addendum)
[facility] [address]  [place], [place], [address] Phone: [phone]   Fax:  [phone]  Physical Therapy Treatment  Patient Details  Name: Tyler Fletcher MRN: [id number] Date of Birth: 04/06/1955 Referring Provider (PT): Netta Cedars, MD   Progress Note Reporting Period 06/14/20 to 06/21/20  See note below for Objective Data and Assessment of Progress/Goals.      Encounter Date: 06/21/2020   PT End of Session - 06/21/20 1739     Visit Number 12    Number of Visits 16    Date for PT Re-Evaluation 07/22/20    Authorization Type [employer]    Authorization Time Period 6 visits approved from 05/16- 06/24    Authorization - Visit Number 2    Authorization - Number of Visits 6    PT Start Time 1700    PT Stop Time 1746    PT Time Calculation (min) 46 min    Activity Tolerance Patient limited by pain    Behavior During Therapy [facility] for tasks assessed/performed             Past Medical History:  Diagnosis Date   Blood pressure elevated without history of HTN 06/09/2016   CAD (coronary artery disease), native coronary artery    06/12/16 PCI w/ DES -->mLAD   Hyperlipidemia     Past Surgical History:  Procedure Laterality Date   CORONARY STENT INTERVENTION N/A 06/12/2016   Procedure: Coronary Stent Intervention;  Surgeon: Belva Crome, MD;  Location: [place];  Service: Cardiovascular;  Laterality: N/A;   LEFT HEART CATH AND CORONARY ANGIOGRAPHY N/A 06/09/2016   Procedure: Left Heart Cath and Coronary Angiography;  Surgeon: Belva Crome, MD;  Location: [facility];  Service: Cardiovascular;  Laterality: N/A;   None      There were no vitals filed for this visit.   Subjective Assessment - 06/21/20 1702     Subjective Requesting to "take it easy on me today." Notes that he went to a concert for 3 hours on Saturday where he was cramped in a seat. Ever since then, he has been  having increased pain in the L buttock and knee and having trouble sleeping. Requesting ROM check. Noting benefit from TENS and massage.    Pertinent History HLD, CAD, HTN    Diagnostic tests none recent    Patient Stated Goals decrease pain    Currently in Pain? Yes    Pain Score 7     Pain Location Knee    Pain Orientation Left    Pain Descriptors / Indicators Aching    Pain Type Acute pain;Surgical pain    Pain Score 9    Pain Location Buttocks    Pain Orientation Left    Pain Descriptors / Indicators Contraction    Pain Type Acute pain                [facility] PT Assessment - 06/21/20 0001       AROM   Left Knee Extension 3    Left Knee Flexion 120      PROM   Left Knee Extension 1    Left Knee Flexion 121                           [facility] Adult PT Treatment/Exercise - 06/21/20 0001       Knee/Hip Exercises: Stretches   Other  Knee/Hip Stretches forward prayer stretch 10x5"    Other Knee/Hip Stretches L SKTC with strap assist 3x10"      Knee/Hip Exercises: Aerobic   Recumbent Bike L1x51mn      Knee/Hip Exercises: Supine   Other Supine Knee/Hip Exercises L sciatic glide 2x20 with strap assist      Modalities   Modalities Electrical Stimulation;Moist Heat      Moist Heat Therapy   Number Minutes Moist Heat 10 Minutes    Moist Heat Location --   L buttock     Electrical Stimulation   Electrical Stimulation Location IFC    Electrical Stimulation Action L buttock    Electrical Stimulation Parameters output to tolerance; 10 min    Electrical Stimulation Goals Pain;Tone      Manual Therapy   Manual Therapy Myofascial release;Soft tissue mobilization    Manual therapy comments prone    Soft tissue mobilization STM/DTM to L piriformis and entire glute    Myofascial Release manual TPR to L piriformis and glutes   significant guarding and twitch response which improved with time                   PT Education - 06/21/20 1739      Education Details update to HEP; advised patient to f/u with MD d/t severe L buttock pain    Person(s) Educated Patient    Methods Explanation;Demonstration;Tactile cues;Verbal cues;Handout    Comprehension Verbalized understanding;Returned demonstration              PT Short Term Goals - 06/10/20 08119      PT SHORT TERM GOAL #1   Title Patient to be independent with initial HEP.    Time 3    Period Weeks    Status Achieved    Target Date 05/26/20               PT Long Term Goals - 06/10/20 0808       PT LONG TERM GOAL #1   Title Patient to be independent with advanced HEP.    Time 6    Period Weeks    Status Partially Met   met for current   Target Date 07/22/20      PT LONG TERM GOAL #2   Title Patient to demonstrate L knee AROM 0-120 degrees.    Time 6    Period Weeks    Status On-going   AROM is now reaching 3-115 deg, PROM 1-119 deg   Target Date 07/22/20      PT LONG TERM GOAL #3   Title Patient to demonstrate B LE strength >/=4+/5.    Time 6    Period Weeks    Status Partially Met   remaining limitations evident in B hip adduction and L knee flexion.   Target Date 07/22/20      PT LONG TERM GOAL #4   Title Patient to demonstrate SLR on L LE without quad lag evident.    Time 6    Period Weeks    Status On-going   mild quad lag remaining   Target Date 07/22/20      PT LONG TERM GOAL #5   Title Patient to demonstrate symmetrical step length, knee flexion, and weight shift with LRAD.    Time 6    Period Weeks    Status Partially Met   heavy L lateral trunk lean and decreased L TKE at heel strike without AD   Target Date 07/22/20  Plan - 06/21/20 1740     Clinical Impression Statement Patient with c/o increased L buttock and L knee pain since attending a concert and sitting in a cramped seat for 3 hours on Saturday. Notes difficulty sleeping d/t the pain, thus encouraged patient to reach out to his MD. Attempted to  find a position of comfort d/t high pain levels, with patient noting good relief with LEs elevated in supine. Patient tolerated STM/DTM and TPR to L glute and piriformis which initially demonstrated significant guarding and twitch response which improved with time. Patient also reported good benefit from pain with MT. Patient was able to perform gentle hip and lumbopelvic stretching with fairly good tolerance, thus updated these exercises into HEP. Ended session with moist heat and e-stim to L buttock for continued pain relief. Patient noted improvement in pain levels at end of session.    Personal Factors and Comorbidities Age;Comorbidity 3+;Fitness;Past/Current Experience;Time since onset of injury/illness/exacerbation    Comorbidities HLD, CAD, HTN    PT Frequency 1x / week    PT Duration 6 weeks    PT Treatment/Interventions ADLs/Self Care Home Management;Cryotherapy;Electrical Stimulation;Iontophoresis 90m/ml Dexamethasone;Moist Heat;Balance training;Therapeutic exercise;Therapeutic activities;Functional mobility training;Stair training;Gait training;Ultrasound;Neuromuscular re-education;Patient/family education;Manual techniques;Vasopneumatic Device;Taping;Energy conservation;Dry needling;Passive range of motion;Scar mobilization    PT Next Visit Plan progress L knee ROM and strengthening    Consulted and Agree with Plan of Care Patient             Patient will benefit from skilled therapeutic intervention in order to improve the following deficits and impairments:  Abnormal gait,Hypomobility,Increased edema,Decreased scar mobility,Decreased activity tolerance,Decreased strength,Increased fascial restricitons,Pain,Difficulty walking,Increased muscle spasms,Improper body mechanics,Decreased range of motion,Impaired flexibility,Postural dysfunction  Visit Diagnosis: Acute pain of left knee  Stiffness of left knee, not elsewhere classified  Muscle weakness (generalized)  Other  abnormalities of gait and mobility  Other muscle spasm     Problem List Patient Active Problem List   Diagnosis Date Noted   Acute ST elevation myocardial infarction (STEMI) involving left anterior descending (LAD) coronary artery ([place] 06/09/2016   Hyperlipidemia LDL goal <70 06/09/2016   STEMI (ST elevation myocardial infarction) ([place] 06/09/2016   Aborted myocardial infarction ([facility]    Coronary artery disease involving native coronary artery of native heart with angina pectoris ([place]      YJanene Harvey PT, DPT 06/21/20 5:51 PM   [facility] [address] [place] [address]: [phone]  Fax:  [phone] Name: Tyler GROFTMRN: [id number] of Birth: 4Sep 24, 1957 PHYSICAL THERAPY DISCHARGE SUMMARY  Visits from Start of Care: 12  Current functional level related to goals / functional outcomes: Unable to assess; patient did not return d/t finances   Remaining deficits: Unable to assess   Education / Equipment: HEP  Plan: Patient agrees to discharge.  Patient goals were not met. Patient is being discharged due to finances.    YJanene Harvey PT, DPT 07/22/20 10:25 AM

## 2020-06-29 ENCOUNTER — Encounter: Payer: Medicare HMO | Admitting: Rehabilitative and Restorative Service Providers"

## 2020-07-13 ENCOUNTER — Encounter: Payer: Medicare HMO | Admitting: Physical Therapy

## 2020-07-20 ENCOUNTER — Encounter: Payer: Medicare HMO | Admitting: Physical Therapy

## 2020-08-09 DIAGNOSIS — M5416 Radiculopathy, lumbar region: Secondary | ICD-10-CM | POA: Diagnosis not present

## 2020-08-18 ENCOUNTER — Other Ambulatory Visit: Payer: Self-pay

## 2020-08-18 ENCOUNTER — Ambulatory Visit: Payer: Medicare HMO | Attending: Chiropractic Medicine | Admitting: Physical Therapy

## 2020-08-18 ENCOUNTER — Encounter: Payer: Self-pay | Admitting: Physical Therapy

## 2020-08-18 DIAGNOSIS — M6281 Muscle weakness (generalized): Secondary | ICD-10-CM | POA: Diagnosis not present

## 2020-08-18 DIAGNOSIS — M62838 Other muscle spasm: Secondary | ICD-10-CM | POA: Diagnosis not present

## 2020-08-18 DIAGNOSIS — R2689 Other abnormalities of gait and mobility: Secondary | ICD-10-CM

## 2020-08-18 DIAGNOSIS — M5416 Radiculopathy, lumbar region: Secondary | ICD-10-CM | POA: Diagnosis not present

## 2020-08-18 NOTE — Patient Instructions (Signed)
Access Code: [email or web address] URL: [email or web address] Date: 08/18/2020 Prepared by: Harrie Foreman  Exercises Beginner Prone Single Leg Raise - 1 x daily - 7 x weekly - 3 sets - 10 reps Prone on Elbows Stretch - 1 x daily - 7 x weekly - 1 sets - 3 min hold Prone Knee Flexion - 1 x daily - 7 x weekly - 3 sets - 10 reps

## 2020-08-18 NOTE — Therapy (Signed)
[facility] [address]  [place], [place], [address] Phone: [phone]   Fax:  [phone]  Physical Therapy Evaluation  Patient Details  Name: Tyler Fletcher MRN: [id number] Date of Birth: Jan 17, 1956 Referring Provider (PT): Levy Pupa, [place]   Encounter Date: 08/18/2020   PT End of Session - 08/18/20 1638     Visit Number 1    Number of Visits 10    Date for PT Re-Evaluation 09/22/20    Authorization Type [employer] MCR    Progress Note Due on Visit 10    PT Start Time 9476    PT Stop Time 1630    PT Time Calculation (min) 50 min             Past Medical History:  Diagnosis Date   Blood pressure elevated without history of HTN 06/09/2016   CAD (coronary artery disease), native coronary artery    06/12/16 PCI w/ DES -->mLAD   Hyperlipidemia     Past Surgical History:  Procedure Laterality Date   CORONARY STENT INTERVENTION N/A 06/12/2016   Procedure: Coronary Stent Intervention;  Surgeon: Belva Crome, MD;  Location: [facility];  Service: Cardiovascular;  Laterality: N/A;   LEFT HEART CATH AND CORONARY ANGIOGRAPHY N/A 06/09/2016   Procedure: Left Heart Cath and Coronary Angiography;  Surgeon: Belva Crome, MD;  Location: [facility];  Service: Cardiovascular;  Laterality: N/A;   None      There were no vitals filed for this visit.    Subjective Assessment - 08/18/20 1547     Subjective Pt. reports history of chronic low back pain but sciatica started 2-3 months ago.  He reports his L knee is doing great (previously in PT for L knee pain followint TKR).  On a recent trip and had a lot of pain after riding in car all day, could barely walk.    Pertinent History CAD, HLD, HTN, history MI    Limitations Sitting;Walking   driving long periods   How long can you sit comfortably? 1-2 hours    How long can you stand comfortably? no limitations    How long can you walk comfortably? walks  1-1.5 miles regularly (1 hour) noticed that hills, especially going downwards bothers him more    Diagnostic tests per patient report X-rays taken showing a lot of arthritis    Patient Stated Goals want the pain to ease up    Currently in Pain? Yes   yesterday had a lot of pain, 7/10 after driving   Pain Score 0-No pain    Pain Location Buttocks    Pain Orientation Left    Pain Descriptors / Indicators Aching;Throbbing;Radiating   miserable   Pain Type Acute pain    Pain Radiating Towards radiates to L foot    Pain Onset More than a month ago    Pain Frequency Intermittent    Aggravating Factors  sitting prolonged periods/driving, walking uneven surfaces, stairs,    Pain Relieving Factors TENS, medication (hyrdocodone)    Effect of Pain on Daily Activities difficult with job activities (needs to drive distances), can't play golf anymore                [facility] PT Assessment - 08/18/20 0001       Assessment   Medical Diagnosis m54.16 lumbar radiculopathy    Referring Provider (PT) Levy Pupa, PA    Onset Date/Surgical Date 06/18/20  Hand Dominance Right    Next MD Visit Sep 27, 2020    Prior Therapy for L knee TKR      Precautions   Precautions Other (comment)   has nitro     Balance Screen   Has the patient fallen in the past 6 months No    Has the patient had a decrease in activity level because of a fear of falling?  No    Is the patient reluctant to leave their home because of a fear of falling?  No      Home Environment   Living Environment Private residence    Living Arrangements Spouse/significant other    Type of [place] to enter    Home Layout Two level;Able to live on main level with bedroom/bathroom      Prior Function   Level of Independence Independent    Vocation Retired   part time work   Biomedical scientist driving      Observation/Other Assessments   Observations independent ambulation, no apparent distress    Focus  on Therapeutic Outcomes (FOTO)  4; risk adjusted 48      Posture/Postural Control   Posture/Postural Control No significant limitations      AROM   AROM Assessment Site Knee;Lumbar    Left Knee Extension 0    Left Knee Flexion 120    Lumbar Flexion WNL    Lumbar Extension WNL    Lumbar - Right Side Bend increased pain on L side    Lumbar - Left Side Bend WNL    Lumbar - Right Rotation WNL    Lumbar - Left Rotation WNL      Strength   Strength Assessment Site --   no weakness bil LE, able to walk on heels and toes, with exception L glute strength 4/5     Flexibility   Soft Tissue Assessment /Muscle Length yes    Hamstrings good, SLR to 90 deg bil    Quadriceps tight bil, limited 90 deg prone knee bend      Palpation   Spinal mobility hypomobile throughout but not tender    SI assessment  no tenderness      Special Tests   Other special tests negative FABER, Scour, SLR.  Positive supine to long sit test L, negative retest after MET                        Objective measurements completed on examination: See above findings.               PT Education - 08/18/20 1637     Education Details initial HEP given for prone extension    Person(s) Educated Patient    Methods Explanation;Demonstration;Tactile cues;Handout    Comprehension Verbalized understanding              PT Short Term Goals - 08/18/20 1803       PT SHORT TERM GOAL #1   Title Pt. will be compliant with initial HEP    Baseline given intial HEP    Time 2    Period Weeks    Status New    Target Date 09/01/20               PT Long Term Goals - 08/18/20 1803       PT LONG TERM GOAL #1   Title Pt. will be independent with progressed HEP  Time 5    Period Weeks    Status New    Target Date 09/22/20      PT LONG TERM GOAL #2   Title Pt will report no more than 3/10 L buttock pain after sitting for 2 hours.    Baseline reports severe pain after prolonged sitting     Time 5    Period Weeks    Status New    Target Date 09/22/20      PT LONG TERM GOAL #3   Title Pt. will be able to walk up 2 flights of stairs without increased L buttock pain    Baseline increased pain with stairs    Time 5    Period Weeks    Status New    Target Date 09/22/20                    Plan - 08/18/20 1639     Clinical Impression Statement Tyler Fletcher is a 65 year old male referred for lumbar radiculopathy that started 2-3 months ago.  He reports intermittent severe pain in L buttock radiating down to L foot.  Today had positive supine to long sit test with posterior rotation L innominate, corrected with muscle energy with L resisted hip extension, negative retest.  No pain in lumbar spine, reports pain in L QL, but very tender over L piriformis.  Recommended trying cold pack over area with acute pain.  Given initial HEP for glute strengthening and prone extension, noted atrophy in L gluteal region.  He would benefit from skilled physical therapy to decrease incidence and severity of pain, and allow return of leisure activities like golf and travel.    Personal Factors and Comorbidities Age;Comorbidity 3+    Comorbidities history L TKR, CAD, MI, HLD    Examination-Activity Limitations Sit    Examination-Participation Restrictions Occupation;Community Activity;Driving;Other   golf   Stability/Clinical Decision Making Stable/Uncomplicated    Clinical Decision Making Low    Rehab Potential Excellent    PT Frequency 2x / week    PT Duration 6 weeks    PT Treatment/Interventions ADLs/Self Care Home Management;Electrical Stimulation;Moist Heat;Cryotherapy;Ultrasound;Stair training;Functional mobility training;Therapeutic activities;Therapeutic exercise;Neuromuscular re-education;Manual techniques;Dry needling;Passive range of motion;Spinal Manipulations;Joint Manipulations;Gait training    PT Next Visit Plan review and progress HEP with mckenzie extension, add  clamshells glut strengthening, manual therapy to lumbar spine/glutes.    PT Home Exercise Plan [email or web address]    Consulted and Agree with Plan of Care Patient             Patient will benefit from skilled therapeutic intervention in order to improve the following deficits and impairments:  Increased muscle spasms, Decreased range of motion, Decreased activity tolerance, Decreased strength, Impaired flexibility, Pain  Visit Diagnosis: Radiculopathy, lumbar region  Muscle weakness (generalized)  Other abnormalities of gait and mobility  Other muscle spasm     Problem List Patient Active Problem List   Diagnosis Date Noted   Acute ST elevation myocardial infarction (STEMI) involving left anterior descending (LAD) coronary artery ([facility]) 06/09/2016   Hyperlipidemia LDL goal <70 06/09/2016   STEMI (ST elevation myocardial infarction) ([facility]) 06/09/2016   Aborted myocardial infarction [facility])    Coronary artery disease involving native coronary artery of native heart with angina pectoris ([facility])     Rennie Natter PT, DPT 08/18/2020, 6:14 PM  [facility] [address]  [place], [place], [address] Phone: [phone]   Fax:  [phone]  Name: Tyler Fletcher MRN: [id number] Date of Birth: Jul 30, 1955

## 2020-08-24 ENCOUNTER — Ambulatory Visit: Payer: Medicare HMO | Admitting: Physical Therapy

## 2020-08-25 ENCOUNTER — Ambulatory Visit: Payer: Medicare HMO

## 2020-08-29 ENCOUNTER — Other Ambulatory Visit: Payer: Self-pay | Admitting: Interventional Cardiology

## 2020-09-06 ENCOUNTER — Ambulatory Visit: Payer: Medicare HMO | Attending: Chiropractic Medicine

## 2020-09-06 ENCOUNTER — Other Ambulatory Visit: Payer: Self-pay

## 2020-09-06 DIAGNOSIS — R2689 Other abnormalities of gait and mobility: Secondary | ICD-10-CM | POA: Diagnosis not present

## 2020-09-06 DIAGNOSIS — M6281 Muscle weakness (generalized): Secondary | ICD-10-CM | POA: Diagnosis not present

## 2020-09-06 DIAGNOSIS — M62838 Other muscle spasm: Secondary | ICD-10-CM

## 2020-09-06 DIAGNOSIS — M5416 Radiculopathy, lumbar region: Secondary | ICD-10-CM | POA: Diagnosis not present

## 2020-09-06 NOTE — Therapy (Signed)
[facility] [address]  [address] [place], [place], [address] Phone: [phone]   Fax:  [phone]  Physical Therapy Treatment  Patient Details  Name: Tyler Fletcher MRN: [id number] Date of Birth: Aug 30, 1955 Referring Provider (PT): Su Hoff, [place]   Encounter Date: 09/06/2020   PT End of Session - 09/06/20 1613     Visit Number 2    Number of Visits 10    Date for PT Re-Evaluation 09/22/20    Authorization Type [employer] MCR    Progress Note Due on Visit 10    PT Start Time 1533    PT Stop Time 1611    PT Time Calculation (min) 38 min    Activity Tolerance Patient tolerated treatment well    Behavior During Therapy [facility] for tasks assessed/performed             Past Medical History:  Diagnosis Date   Blood pressure elevated without history of HTN 06/09/2016   CAD (coronary artery disease), native coronary artery    06/12/16 PCI w/ DES -->mLAD   Hyperlipidemia     Past Surgical History:  Procedure Laterality Date   CORONARY STENT INTERVENTION N/A 06/12/2016   Procedure: Coronary Stent Intervention;  Surgeon: Lyn Records, MD;  [facility];  Service: Cardiovascular;  Laterality: N/A;   LEFT HEART CATH AND CORONARY ANGIOGRAPHY N/A 06/09/2016   Procedure: Left Heart Cath and Coronary Angiography;  Surgeon: Lyn Records, MD;  Location: [facility];  Service: Cardiovascular;  Laterality: N/A;   None      There were no vitals filed for this visit.   Subjective Assessment - 09/06/20 1535     Subjective Back is doing ok right now, went on a trip last week and the car ride was rough.    Pertinent History CAD, HLD, HTN, history MI    Diagnostic tests per patient report X-rays taken showing a lot of arthritis    Patient Stated Goals want the pain to ease up    Currently in Pain? No/denies                               [facility] Adult PT Treatment/Exercise - 09/06/20 0001        Exercises   Exercises Lumbar;Knee/Hip      Lumbar Exercises: Stretches   Active Hamstring Stretch Right;Left;2 reps;30 seconds    Active Hamstring Stretch Limitations seated with strap    Press Ups 10 reps;5 seconds    Press Ups Limitations on elbows    Piriformis Stretch Right;Left;2 reps;30 seconds    Gastroc Stretch Right;Left;2 reps;30 seconds    Gastroc Stretch Limitations with strap      Lumbar Exercises: Supine   Bridge 10 reps    Bridge Limitations red TB    Straight Leg Raise 10 reps      Lumbar Exercises: Sidelying   Clam Both;10 reps;3 seconds    Clam Limitations red TB; 3 hold"      Lumbar Exercises: Prone   Other Prone Lumbar Exercises hs curl 2x10    Other Prone Lumbar Exercises hip extension with knee bent 10 reps each                    PT Education - 09/06/20 1654     Education Details HEP update: Access Code: [email or web address]    Person(s) Educated Patient  Methods Explanation;Demonstration    Comprehension Verbalized understanding;Returned demonstration              PT Short Term Goals - 08/18/20 1803       PT SHORT TERM GOAL #1   Title Pt. will be compliant with initial HEP    Baseline given intial HEP    Time 2    Period Weeks    Status New    Target Date 09/01/20               PT Long Term Goals - 08/18/20 1803       PT LONG TERM GOAL #1   Title Pt. will be independent with progressed HEP    Time 5    Period Weeks    Status New    Target Date 09/22/20      PT LONG TERM GOAL #2   Title Pt will report no more than 3/10 L buttock pain after sitting for 2 hours.    Baseline reports severe pain after prolonged sitting    Time 5    Period Weeks    Status New    Target Date 09/22/20      PT LONG TERM GOAL #3   Title Pt. will be able to walk up 2 flights of stairs without increased L buttock pain    Baseline increased pain with stairs    Time 5    Period Weeks    Status New    Target Date 09/22/20                    Plan - 09/06/20 1613     Clinical Impression Statement Pt demonstrates B glute weakness and L piriformis tightness. Pt noted L calf tightness lately but denies redness or tenderness. Did hs and calf stretch to address muscle tension in calf, pt noted relief from this. Continued with prone hip exercises to strengthen the glutes and improve stability of the low back. Cues required in prone to prevent pelvic rotation with hip extension. Cues with other exercises to correct form PRN and updated HEP with glute strengthening exercises to improve and reduce pull on low back. Pt responded well.    Personal Factors and Comorbidities Age;Comorbidity 3+    Comorbidities history L TKR, CAD, MI, HLD    PT Frequency 2x / week    PT Duration 6 weeks    PT Treatment/Interventions ADLs/Self Care Home Management;Electrical Stimulation;Moist Heat;Cryotherapy;Ultrasound;Stair training;Functional mobility training;Therapeutic activities;Therapeutic exercise;Neuromuscular re-education;Manual techniques;Dry needling;Passive range of motion;Spinal Manipulations;Joint Manipulations;Gait training    PT Next Visit Plan review and progress HEP with mckenzie extension, add clamshells glut strengthening, manual therapy to lumbar spine/glutes.    PT Home Exercise Plan [email or web address]    Consulted and Agree with Plan of Care Patient             Patient will benefit from skilled therapeutic intervention in order to improve the following deficits and impairments:  Increased muscle spasms, Decreased range of motion, Decreased activity tolerance, Decreased strength, Impaired flexibility, Pain  Visit Diagnosis: Radiculopathy, lumbar region  Muscle weakness (generalized)  Other abnormalities of gait and mobility  Other muscle spasm     Problem List Patient Active Problem List   Diagnosis Date Noted   Acute ST elevation myocardial infarction (STEMI) involving left anterior descending (LAD) coronary artery  ([facility]) 06/09/2016   Hyperlipidemia LDL goal <70 06/09/2016   STEMI (ST elevation myocardial infarction) ([facility]) 06/09/2016   Aborted myocardial infarction ([facility])  Coronary artery disease involving native coronary artery of native heart with angina pectoris ([facility])     Darleene Cleaver, PTA 09/06/2020, 4:55 PM  [facility] [address]  [address] [place], [place], [address] Phone: [phone]   Fax:  [phone]  Name: Tyler Fletcher MRN: [id number] Date of Birth: 11/12/1955

## 2020-09-12 ENCOUNTER — Encounter: Payer: Self-pay | Admitting: Physical Therapy

## 2020-09-12 ENCOUNTER — Ambulatory Visit: Payer: Medicare HMO | Admitting: Physical Therapy

## 2020-09-12 ENCOUNTER — Other Ambulatory Visit: Payer: Self-pay

## 2020-09-12 DIAGNOSIS — R2689 Other abnormalities of gait and mobility: Secondary | ICD-10-CM | POA: Diagnosis not present

## 2020-09-12 DIAGNOSIS — M62838 Other muscle spasm: Secondary | ICD-10-CM

## 2020-09-12 DIAGNOSIS — M5416 Radiculopathy, lumbar region: Secondary | ICD-10-CM

## 2020-09-12 DIAGNOSIS — M6281 Muscle weakness (generalized): Secondary | ICD-10-CM | POA: Diagnosis not present

## 2020-09-12 NOTE — Therapy (Addendum)
PHYSICAL THERAPY DISCHARGE SUMMARY  Visits from Start of Care: 3  Current functional level related to goals / functional outcomes: See last treatment note below.  Patient was making good progress but had to cancel all remaining visits due to family emergencies. Has not returned since 09/12/20.    Remaining deficits: See last treatment note   Education / Equipment: HEP   Plan: Patient is being discharged due to not returning to therapy >1 month.  Will require new referral to resume.       [facility] [address]  [address] [place], [place], [address] Phone: [phone]   Fax:  [phone]  Physical Therapy Treatment  Patient Details  Name: Tyler Fletcher MRN: [id number] Date of Birth: 03-31-1955 Referring Provider (PT): Su Hoff, [place]   Encounter Date: 09/12/2020   PT End of Session - 09/12/20 1627     Visit Number 3    Number of Visits 10    Date for PT Re-Evaluation 09/22/20    Authorization Type [employer] MCR    Progress Note Due on Visit 10    PT Start Time 1625    PT Stop Time 1700    PT Time Calculation (min) 35 min    Activity Tolerance Patient tolerated treatment well    Behavior During Therapy [facility] for tasks assessed/performed             Past Medical History:  Diagnosis Date   Blood pressure elevated without history of HTN 06/09/2016   CAD (coronary artery disease), native coronary artery    06/12/16 PCI w/ DES -->mLAD   Hyperlipidemia     Past Surgical History:  Procedure Laterality Date   CORONARY STENT INTERVENTION N/A 06/12/2016   Procedure: Coronary Stent Intervention;  Surgeon: Lyn Records, MD;  [facility];  Service: Cardiovascular;  Laterality: N/A;   LEFT HEART CATH AND CORONARY ANGIOGRAPHY N/A 06/09/2016   Procedure: Left Heart Cath and Coronary Angiography;  Surgeon: Lyn Records, MD;  Location: [facility] INV[facility];  Service: Cardiovascular;  Laterality: N/A;    None      There were no vitals filed for this visit.   Subjective Assessment - 09/12/20 1627     Subjective Reports he had to go out of town due to family emergency.  Reports his low back is bothering him a little.  He had some swelling in his L leg this weekend, but its better today.    Pertinent History CAD, HLD, HTN, history MI    Diagnostic tests per patient report X-rays taken showing a lot of arthritis    Patient Stated Goals want the pain to ease up    Currently in Pain? Yes    Pain Score 5    7/10 over weekend   Pain Location Back    Pain Orientation Lower;Right;Left    Pain Descriptors / Indicators Aching;Sore;Tender    Pain Type Acute pain    Pain Radiating Towards radiating to L foot                               [facility] Adult PT Treatment/Exercise - 09/12/20 0001       Exercises   Exercises Lumbar;Knee/Hip      Lumbar Exercises: Stretches   Lower Trunk Rotation 5 reps    Lower Trunk Rotation Limitations bil    Standing Side Bend 5 reps  Standing Extension 10 reps    Figure 4 Stretch 5 reps;10 seconds;Supine;With overpressure      Lumbar Exercises: Aerobic   Nustep L5 x 5 min      Lumbar Exercises: Supine   Bridge 20 reps      Lumbar Exercises: Sidelying   Clam Both;10 reps;3 seconds    Clam Limitations red TB; 3 hold"      Lumbar Exercises: Prone   Other Prone Lumbar Exercises hs curl 2x10      Manual Therapy   Manual Therapy Soft tissue mobilization    Soft tissue mobilization STM to L glutes, QL, lumbar paraspinals.                    PT Education - 09/12/20 1655     Education Details reviewed and progressed HEP.    Person(s) Educated Patient    Methods Explanation;Demonstration;Handout;Verbal cues    Comprehension Verbalized understanding;Returned demonstration              PT Short Term Goals - 08/18/20 1803       PT SHORT TERM GOAL #1   Title Pt. will be compliant with initial HEP    Baseline given  intial HEP    Time 2    Period Weeks    Status New    Target Date 09/01/20               PT Long Term Goals - 08/18/20 1803       PT LONG TERM GOAL #1   Title Pt. will be independent with progressed HEP    Time 5    Period Weeks    Status New    Target Date 09/22/20      PT LONG TERM GOAL #2   Title Pt will report no more than 3/10 L buttock pain after sitting for 2 hours.    Baseline reports severe pain after prolonged sitting    Time 5    Period Weeks    Status New    Target Date 09/22/20      PT LONG TERM GOAL #3   Title Pt. will be able to walk up 2 flights of stairs without increased L buttock pain    Baseline increased pain with stairs    Time 5    Period Weeks    Status New    Target Date 09/22/20                   Plan - 09/12/20 1648     Clinical Impression Statement Patient reported increased low back pain today - with family emergency he has been unable to come to PT and inconsistent with HEP.  Reviewed and advanced exercises today to add McKenzie extension based exercises which he tolerated well.  Modified piriformis stretch to push away rather than KTOS to gap piriformis.  Noted L glut med weakness today compared to R side.  Reported decreased pain after therex, and no pain after STM.  He would benefit from continued skilled therapy.             Patient will benefit from skilled therapeutic intervention in order to improve the following deficits and impairments:     Visit Diagnosis: Radiculopathy, lumbar region  Muscle weakness (generalized)  Other abnormalities of gait and mobility  Other muscle spasm     Problem List Patient Active Problem List   Diagnosis Date Noted   Acute ST elevation myocardial infarction (STEMI) involving left  anterior descending (LAD) coronary artery ([facility]CC) 06/09/2016   Hyperlipidemia LDL goal <70 06/09/2016   STEMI (ST elevation myocardial infarction) ([facility]) 06/09/2016   Aborted myocardial infarction  [facility])    Coronary artery disease involving native coronary artery of native heart with angina pectoris (HCC)     Jena Gauss PT, DPT 09/12/2020, 5:03 PM  [facility] [address]  [address] [place], [place], [address] Phone: [phone]   Fax:  [phone]  Name: Tyler Fletcher MRN: [id number] Date of Birth: February 15, 1955

## 2020-09-12 NOTE — Patient Instructions (Signed)
Access Code: [email or web address] URL: [email or web address] Date: 09/12/2020 Prepared by: Harrie Foreman  Exercises Right Standing Lateral Shift Correction at Wall - Repetitions - 5 x daily - 7 x weekly - 1 sets - 10 reps Standing Lumbar Extension at Wall - Forearms - 5 x daily - 7 x weekly - 1 sets - 10 reps Supine Lower Trunk Rotation - 1 x daily - 7 x weekly - 2 sets - 10 reps Supine Bridge - 1 x daily - 7 x weekly - 3 sets - 10 reps Prone Press Up - 1 x daily - 7 x weekly - 1 sets - 10 reps

## 2020-09-15 ENCOUNTER — Ambulatory Visit: Payer: Medicare HMO

## 2020-09-19 ENCOUNTER — Encounter: Payer: Medicare HMO | Admitting: Physical Therapy

## 2020-09-27 DIAGNOSIS — M5459 Other low back pain: Secondary | ICD-10-CM | POA: Diagnosis not present

## 2020-10-12 DIAGNOSIS — M5459 Other low back pain: Secondary | ICD-10-CM | POA: Diagnosis not present

## 2020-10-17 ENCOUNTER — Other Ambulatory Visit: Payer: Self-pay | Admitting: Physical Medicine and Rehabilitation

## 2020-10-17 DIAGNOSIS — N1330 Unspecified hydronephrosis: Secondary | ICD-10-CM

## 2020-10-21 ENCOUNTER — Other Ambulatory Visit: Payer: Self-pay

## 2020-10-21 ENCOUNTER — Ambulatory Visit
Admission: RE | Admit: 2020-10-21 | Discharge: 2020-10-21 | Disposition: A | Discharge status: Home / Self Care | Payer: Medicare HMO | Source: Ambulatory Visit | Attending: Physical Medicine and Rehabilitation | Admitting: Physical Medicine and Rehabilitation

## 2020-10-21 DIAGNOSIS — K5730 Diverticulosis of large intestine without perforation or abscess without bleeding: Secondary | ICD-10-CM | POA: Diagnosis not present

## 2020-10-21 DIAGNOSIS — R109 Unspecified abdominal pain: Secondary | ICD-10-CM | POA: Diagnosis not present

## 2020-10-21 DIAGNOSIS — N1330 Unspecified hydronephrosis: Secondary | ICD-10-CM

## 2020-10-25 DIAGNOSIS — M791 Myalgia, unspecified site: Secondary | ICD-10-CM | POA: Diagnosis not present

## 2020-10-25 DIAGNOSIS — M5416 Radiculopathy, lumbar region: Secondary | ICD-10-CM | POA: Diagnosis not present

## 2020-10-27 DIAGNOSIS — R3914 Feeling of incomplete bladder emptying: Secondary | ICD-10-CM | POA: Diagnosis not present

## 2020-10-27 DIAGNOSIS — N130 Hydronephrosis with ureteropelvic junction obstruction: Secondary | ICD-10-CM | POA: Diagnosis not present

## 2020-10-27 DIAGNOSIS — N5201 Erectile dysfunction due to arterial insufficiency: Secondary | ICD-10-CM | POA: Diagnosis not present

## 2020-10-27 DIAGNOSIS — R972 Elevated prostate specific antigen [PSA]: Secondary | ICD-10-CM | POA: Diagnosis not present

## 2020-11-22 DIAGNOSIS — M5416 Radiculopathy, lumbar region: Secondary | ICD-10-CM | POA: Diagnosis not present

## 2020-12-29 DIAGNOSIS — M5416 Radiculopathy, lumbar region: Secondary | ICD-10-CM | POA: Diagnosis not present

## 2020-12-29 DIAGNOSIS — G894 Chronic pain syndrome: Secondary | ICD-10-CM | POA: Diagnosis not present

## 2021-02-27 DIAGNOSIS — N130 Hydronephrosis with ureteropelvic junction obstruction: Secondary | ICD-10-CM | POA: Diagnosis not present

## 2021-02-27 DIAGNOSIS — R3914 Feeling of incomplete bladder emptying: Secondary | ICD-10-CM | POA: Diagnosis not present

## 2021-03-01 ENCOUNTER — Other Ambulatory Visit: Payer: Self-pay | Admitting: Urology

## 2021-03-16 NOTE — Progress Notes (Addendum)
COVID swab appointment: 04-05-21 @  COVID Vaccine Completed: Date COVID Vaccine completed: Has received booster: COVID vaccine manufacturer: Pfizer    [employer]   Date of COVID positive in last 90 days:  PCP - Lupe Carney, MD Cardiologist - Verdis Prime, MD  Chest x-ray -  EKG - 04-11-20 Epic Stress Test - greater than 2 years Epic ECHO -  Cardiac Cath - greater than 2 years Epic Pacemaker/ICD device last checked: Spinal Cord Stimulator:  Bowel Prep -   Sleep Study -  CPAP -   Fasting Blood Sugar -  Checks Blood Sugar _____ times a day  Blood Thinner Instructions: Aspirin Instructions:  ASA 81 mg  Last Dose:  Activity level:  Can go up a flight of stairs and perform activities of daily living without stopping and without symptoms of chest pain or shortness of breath.   Able to exercise without symptoms  Unable to go up a flight of stairs without symptoms of     Anesthesia Review:  CAD, MI with stenting, HTN  Patient denies shortness of breath, fever, cough and chest pain at PAT appointment   Patient verbalized understanding of instructions that were given to them at the PAT appointment. Patient was also instructed that they will need to review over the PAT instructions again at home before surgery.

## 2021-03-16 NOTE — Patient Instructions (Addendum)
DUE TO COVID-19 ONLY ONE VISITOR IS ALLOWED TO COME WITH YOU AND STAY IN THE WAITING ROOM ONLY DURING PRE OP AND PROCEDURE.   **NO VISITORS ARE ALLOWED IN THE SHORT STAY AREA OR RECOVERY ROOM!!**  IF YOU WILL BE ADMITTED INTO THE HOSPITAL YOU ARE ALLOWED ONLY TWO SUPPORT PEOPLE DURING VISITATION HOURS ONLY (7 AM -8PM)    Up to two visitors ages 44+ are allowed at one time in a patient's room.  The visitors may rotate out with other people throughout the day.  Additionally, up to two children between the ages of 36 and 47 are allowed and do not count toward the number of allowed visitors.  Children within this age range must be accompanied by an adult visitor.  One adult visitor may remain with the patient overnight and must be in the room by 8 PM.  COVID SWAB TESTING MUST BE COMPLETED ON:  04-05-21 @    COME IN THROUGH MAIN ENTRANCE of [facility].  Take a seat in the lobby area to the right as you come in the main entrance.  Call [phone]  and give your name and let them know you are here for COVID testing  You are not required to quarantine, however you are required to wear a well-fitted mask when you are out and around people not in your household.  Hand Hygiene often Do NOT share personal items Notify your provider if you are in close contact with someone who has COVID or you develop fever 100.4 or greater, new onset of sneezing, cough, sore throat, shortness of breath or body aches.        Your procedure is scheduled on: Friday, 04-07-21   Report to [facility] Main  Entrance     Report to admitting at 8:45 AM   Call this number if you have problems the morning of surgery [phone]   Follow a clear liquid diet day before surgery  Follow prep instructions from surgeon's office  Miralax - at 4 PM the day before surgery, mix contents of container with 64 ounces Gatorade.  Drink one glass every 15-30 minutes until finished   Do not eat food :After Midnight.   May have  liquids until 8:00 AM day of surgery  CLEAR LIQUID DIET  Foods Allowed                                                                     Foods Excluded  Water, Black Coffee (no milk/no creamer) and tea, regular and decaf                              liquids that you cannot  Plain Jell-O in any flavor  (No red)                         see through such as: Fruit ices (not with fruit pulp)                                 milk, soups, orange juice  Iced Popsicles (No red)  All solid food                             Apple juices Sports drinks like Gatorade (No red) Lightly seasoned clear broth or consume(fat free) Sugar Sample Menu Breakfast                                Lunch                                     Supper Cranberry juice                    Beef broth                            Chicken broth Jell-O                                     Grape juice                           Apple juice Coffee or tea                        Jell-O                                      Popsicle                                                Coffee or tea                        Coffee or tea       Oral Hygiene is also important to reduce your risk of infection.                                    Remember - BRUSH YOUR TEETH THE MORNING OF SURGERY WITH YOUR REGULAR TOOTHPASTE   Do NOT smoke after Midnight   Take these medicines the morning of surgery with A SIP OF WATER:  Carvediolol, Pepcid, Finasteride, Tamsulosin.  Oxycodone if needed   Stop all vitamins and herbal supplements a week before surgery.     Stop Motrin, Aleve, Ibuprofen a week before surgery.             You may not have any metal on your body including  jewelry, and body piercing             Do not wear  lotions, powders, cologne, or deodorant              Men may shave face and neck.   Contacts, dentures or bridgework may not be worn into surgery.  Bring small overnight bag day of surgery.   Do not bring valuables to the hospital. [facility] IS NOT RESPONSIBLE FOR  VALUABLES.    Special Instructions: Bring a copy of your healthcare power of attorney and living will documents the day of surgery if you haven't scanned them in before.  Please read over the following fact sheets you were given: IF YOU HAVE QUESTIONS ABOUT YOUR PRE OP INSTRUCTIONS PLEASE CALL [phone]   [facility] - Preparing for Surgery Before surgery, you can play an important role.  Because skin is not sterile, your skin needs to be as free of germs as possible.  You can reduce the number of germs on your skin by washing with CHG (chlorahexidine gluconate) soap before surgery.  CHG is an antiseptic cleaner which kills germs and bonds with the skin to continue killing germs even after washing. Please DO NOT use if you have an allergy to CHG or antibacterial soaps.  If your skin becomes reddened/irritated stop using the CHG and inform your nurse when you arrive at Short Stay. Do not shave (including legs and underarms) for at least 48 hours prior to the first CHG shower.  You may shave your face/neck.  Please follow these instructions carefully:  1.  Shower with CHG Soap the night before surgery and the  morning of surgery.  2.  If you choose to wash your hair, wash your hair first as usual with your normal  shampoo.  3.  After you shampoo, rinse your hair and body thoroughly to remove the shampoo.                             4.  Use CHG as you would any other liquid soap.  You can apply chg directly to the skin and wash.  Gently with a scrungie or clean washcloth.  5.  Apply the CHG Soap to your body ONLY FROM THE NECK DOWN.   Do   not use on face/ open                           Wound or open sores. Avoid contact with eyes, ears mouth and   genitals (private parts).                       Wash face,  Genitals (private parts) with your normal soap.             6.  Wash thoroughly, paying special attention to the area  where your    surgery  will be performed.  7.  Thoroughly rinse your body with warm water from the neck down.  8.  DO NOT shower/wash with your normal soap after using and rinsing off the CHG Soap.                9.  Pat yourself dry with a clean towel.            10.  Wear clean pajamas.            11.  Place clean sheets on your bed the night of your first shower and do not  sleep with pets. Day of Surgery : Do not apply any lotions/deodorants the morning of surgery.  Please wear clean clothes to the hospital/surgery center.  FAILURE TO FOLLOW THESE INSTRUCTIONS MAY RESULT IN THE CANCELLATION OF YOUR SURGERY  PATIENT SIGNATURE_________________________________  NURSE SIGNATURE__________________________________  ________________________________________________________________________

## 2021-03-17 ENCOUNTER — Other Ambulatory Visit: Payer: Self-pay

## 2021-03-17 ENCOUNTER — Encounter (HOSPITAL_COMMUNITY)
Admission: RE | Admit: 2021-03-17 | Discharge: 2021-03-17 | Disposition: A | Discharge status: Home / Self Care | Payer: Medicare HMO | Source: Ambulatory Visit | Attending: Urology | Admitting: Urology

## 2021-03-17 VITALS — BP 164/92 | HR 90 | Temp 98.5°F | Ht 75.0 in | Wt 216.0 lb

## 2021-03-17 DIAGNOSIS — Z01812 Encounter for preprocedural laboratory examination: Secondary | ICD-10-CM | POA: Insufficient documentation

## 2021-03-17 DIAGNOSIS — I2510 Atherosclerotic heart disease of native coronary artery without angina pectoris: Secondary | ICD-10-CM | POA: Diagnosis not present

## 2021-03-17 DIAGNOSIS — Z01818 Encounter for other preprocedural examination: Secondary | ICD-10-CM

## 2021-03-17 LAB — BASIC METABOLIC PANEL
Anion gap: 6 (ref 5–15)
BUN: 24 mg/dL — ABNORMAL HIGH (ref 8–23)
CO2: 28 mmol/L (ref 22–32)
Calcium: 9.2 mg/dL (ref 8.9–10.3)
Chloride: 103 mmol/L (ref 98–111)
Creatinine, Ser: 1.7 mg/dL — ABNORMAL HIGH (ref 0.61–1.24)
GFR, Estimated: 44 mL/min — ABNORMAL LOW (ref >60)
Glucose, Bld: 159 mg/dL — ABNORMAL HIGH (ref 70–99)
Potassium: 4.7 mmol/L (ref 3.5–5.1)
Sodium: 137 mmol/L (ref 135–145)

## 2021-03-17 LAB — CBC
HCT: 39.4 % (ref 39.0–52.0)
Hemoglobin: 13.8 g/dL (ref 13.0–17.0)
MCH: 33.3 pg (ref 26.0–34.0)
MCHC: 35 g/dL (ref 30.0–36.0)
MCV: 95.2 fL (ref 80.0–100.0)
Platelets: 126 10*3/uL — ABNORMAL LOW (ref 150–400)
RBC: 4.14 MIL/uL — ABNORMAL LOW (ref 4.22–5.81)
RDW: 12.7 % (ref 11.5–15.5)
WBC: 7.3 10*3/uL (ref 4.0–10.5)
nRBC: 0 % (ref 0.0–0.2)

## 2021-03-17 NOTE — Progress Notes (Signed)
Lab. Results: Creatinine: 1.70

## 2021-03-20 NOTE — Progress Notes (Signed)
Anesthesia Chart Review   Case: [id number] Date/Time: 04/07/21 1045   Procedure: XI ROBOTIC ASSISTED SIMPLE PROSTATECTOMY   Anesthesia type: General   Pre-op diagnosis: PROSTATIC HYPERTROPHY WITH RETENTION   Location: [facility] ROOM 03 / [facility]   Surgeons: Sebastian Ache, Tyler Fletcher       DISCUSSION:65 y.o. never smoker with h/o HTN, CAD (DES 2018), prostatic hypertrophy with retention scheduled for above procedure 04/07/2021 with Dr. Sebastian Ache.   Pt last seen by cardiology 04/11/2020. Advised to follow up in 5 months.  Cardiac clearance requested.   Addendum 03/29/21:  Pt seen by cardiology 03/29/21. Per OV note, "Chart reviewed as part of pre-operative protocol coverage. Given past medical history and time since last visit, based on ACC/AHA guidelines, Tyler Fletcher would be at acceptable risk for the planned procedure without further cardiovascular testing.    His RCRI is a class III risk, 6.6% risk of major cardiac event.  He is able to complete greater than 4 METS of physical activity."  Anticipate pt can proceed with planned procedure barring acute status change.   VS: BP (!) 164/92    Pulse 90    Temp 36.9 C (Oral)    Ht 6\' 3"  (1.905 m)    Wt 98 kg    SpO2 99%    BMI 27.00 kg/m   PROVIDERS: Tyler Fletcher, L. , Tyler Fletcher is PCP   Tyler Saucer, Tyler Fletcher is Cardiologist  LABS: Labs reviewed: Acceptable for surgery. (all labs ordered are listed, but only abnormal results are displayed)  Labs Reviewed  CBC - Abnormal; Notable for the following components:      Result Value   RBC 4.14 (*)    Platelets 126 (*)    All other components within normal limits  BASIC METABOLIC PANEL - Abnormal; Notable for the following components:   Glucose, Bld 159 (*)    BUN 24 (*)    Creatinine, Ser 1.70 (*)    GFR, Estimated 44 (*)    All other components within normal limits     IMAGES:   EKG: 04/11/2020 Rate 72 bpm  SR  CV:  Past Medical History:  Diagnosis Date   Blood pressure elevated without history  of HTN 06/09/2016   CAD (coronary artery disease), native coronary artery    06/12/16 PCI w/ DES -->mLAD   Hyperlipidemia     Past Surgical History:  Procedure Laterality Date   CORONARY STENT INTERVENTION N/A 06/12/2016   Procedure: Coronary Stent Intervention;  Surgeon: 06/14/2016, Tyler Fletcher;  Location: [facility] INVASIVE CV LAB;  Service: Cardiovascular;  Laterality: N/A;   LEFT HEART CATH AND CORONARY ANGIOGRAPHY N/A 06/09/2016   Procedure: Left Heart Cath and Coronary Angiography;  Surgeon: 08/09/2016, Tyler Fletcher;  Location: [facility] INVASIVE CV LAB;  Service: Cardiovascular;  Laterality: N/A;   None      MEDICATIONS:  aspirin 81 MG chewable tablet   atorvastatin (LIPITOR) 20 MG tablet   carvedilol (COREG) 12.5 MG tablet   cyclobenzaprine (FLEXERIL) 10 MG tablet   docusate sodium (COLACE) 100 MG capsule   famotidine (PEPCID) 20 MG tablet   finasteride (PROSCAR) 5 MG tablet   MAGNESIUM PO   Multiple Vitamin (MULTIVITAMIN PO)   nitroGLYCERIN (NITROSTAT) 0.4 MG SL tablet   Oxycodone HCl 10 MG TABS   tamsulosin (FLOMAX) 0.4 MG CAPS capsule   No current facility-administered medications for this encounter.     [facility] Ward, PA-C [facility] Pre-Surgical Testing [phone]

## 2021-03-22 ENCOUNTER — Other Ambulatory Visit (HOSPITAL_COMMUNITY): Payer: Self-pay

## 2021-03-22 ENCOUNTER — Telehealth: Payer: Self-pay | Admitting: Interventional Cardiology

## 2021-03-22 NOTE — Telephone Encounter (Signed)
° °  Patient Name: Tyler Fletcher  DOB: 01-Aug-1955 MRN: [id number]  Primary Cardiologist: Lesleigh Noe, MD  Chart reviewed as part of pre-operative protocol coverage.  Patient contacted on 03/22/2021 as part of preoperative screening process.  No answer. Left voicemail for patient to call back.   Joylene Grapes, NP 03/22/2021, 3:55 PM

## 2021-03-22 NOTE — Telephone Encounter (Signed)
° °  [facility] Pre-operative Risk Assessment    Request for surgical clearance:  What type of surgery is being performed?  Prostatectomy   When is this surgery scheduled?  04/07/21   What type of clearance is required (medical clearance vs. Pharmacy clearance to hold med vs. Both)?  Medical    Are there any medications that need to be held prior to surgery and how long? No    Practice name and name of physician performing surgery?  [facility]   Dr. Alexis Frock   What is your office phone number? [phone])    7.   What is your office fax number? [phone]  8.   Anesthesia type (None, local, MAC, general) ?  General    Tyler Fletcher 03/22/2021, 2:14 PM

## 2021-03-23 NOTE — Progress Notes (Signed)
Cardiology Office Note:    Date:  03/23/2021   ID:  Tyler Fletcher, DOB 18-Feb-1955, MRN [id number]  PCP:  Aurea Graff.Marlou Sa, [place] Providers Cardiologist:  Sinclair Grooms, MD      Referring MD: Aurea Graff.Marlou Sa, MD   Follow-up for preoperative cardiac evaluation ,coronary artery disease, and hypertension.     History of Present Illness:    Tyler Fletcher is a 66 y.o. male with a hx of coronary artery disease, hyperlipidemia and hypertension.  He was noted to have STEMI in 2018.  He underwent emergent cardiac catheterization which showed a 75% mid LAD lesion.  Due to the appearance of the lesion intervention was not felt to be the best option for treatment.  He was treated with nitroglycerin and heparin for 24 hours.  He was sent for exercise treadmill testing to determine if he needed PCI.  He developed diffuse ST depression with TWI during recovery.  Due to the findings he was taken for cardiac catheterization with PCI and DES x1 to his mid LAD.  He was seen by Robbie Lis PA-C in follow-up on 04/11/2020.  During that time he denied chest pain, shortness of breath, orthopnea, PND, syncope, lower extremity swelling, and melena.  He presents to the clinic today for follow-up evaluation and preoperative cardiac evaluation.  He states he feels well.  He continues to work 5 days/week.  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  He denies episodes of chest discomfort.  We reviewed his upcoming surgery.  He continues to refrain from smoking and EtOH.  He has been monitoring his diet closely.  The has been trying to maintain a healthy weight.  His weight today is 220 pounds.  We reviewed his previous angiography.  His EKG today shows normal sinus rhythm 69 bpm.  I will have him repeat his fasting lipids and LFTs during his upcoming PCP appointment in April.  We will continue his current medication regimen.  I will give him a salty 6 diet sheet, and plan  follow-up for 12 months.  He reports that Dr. 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  Today he denies chest pain, shortness of breath, lower extremity edema, fatigue, palpitations, melena, hematuria, hemoptysis, diaphoresis, weakness, presyncope, syncope, orthopnea, and PND.  Past Medical History:  Diagnosis Date   Blood pressure elevated without history of HTN 06/09/2016   CAD (coronary artery disease), native coronary artery    06/12/16 PCI w/ DES -->mLAD   Hyperlipidemia     Past Surgical History:  Procedure Laterality Date   CORONARY STENT INTERVENTION N/A 06/12/2016   Procedure: Coronary Stent Intervention;  Surgeon: Belva Crome, MD;  Location: [facility];  Service: Cardiovascular;  Laterality: N/A;   LEFT HEART CATH AND CORONARY ANGIOGRAPHY N/A 06/09/2016   Procedure: Left Heart Cath and Coronary Angiography;  Surgeon: Belva Crome, MD;  Location: [facility];  Service: Cardiovascular;  Laterality: N/A;   None      Current Medications: No outpatient medications have been marked as taking for the 03/29/21 encounter (Appointment) with Deberah Pelton, NP.     Allergies:   Patient has no known allergies.   Social History   Socioeconomic History   Marital status: Married    Spouse name: Not on file   Number of children: 2   Years of education: Not on file   Highest education level: Not on  file  Occupational History   Occupation: Unemployed  Tobacco Use   Smoking status: Never   Smokeless tobacco: Never  Vaping Use   Vaping Use: Never used  Substance and Sexual Activity   Alcohol use: No   Drug use: No   Sexual activity: Not on file  Other Topics Concern   Not on file  Social History Narrative   Not on file   Social Determinants of Health   Financial Resource Strain: Not on file  Food Insecurity: Not on file  Transportation Needs: Not on file  Physical Activity: Not on file  Stress: Not on file  Social  Connections: Not on file     Family History: The patient's family history includes Heart attack in his father; Heart disease in his father; Hypertension in his mother.  ROS:   Please see the history of present illness.     All other systems reviewed and are negative.   Risk Assessment/Calculations:           Physical Exam:    VS:  There were no vitals taken for this visit.    Wt Readings from Last 3 Encounters:  03/17/21 216 lb (98 kg)  04/11/20 212 lb (96.2 kg)  09/29/19 211 lb 6.4 oz (95.9 kg)     GEN:  Well nourished, well developed in no acute distress HEENT: Normal NECK: No JVD; No carotid bruits LYMPHATICS: No lymphadenopathy CARDIAC: RRR, no murmurs, rubs, gallops RESPIRATORY:  Clear to auscultation without rales, wheezing or rhonchi  ABDOMEN: Soft, non-tender, non-distended MUSCULOSKELETAL:  No edema; No deformity  SKIN: Warm and dry NEUROLOGIC:  Alert and oriented x 3 PSYCHIATRIC:  Normal affect    EKGs/Labs/Other Studies Reviewed:    The following studies were reviewed today:  Cardiac catheterization 06/12/2016 Successful stent mid LAD reducing a 90% stenosis to 0% with TIMI grade 3 flow. 3.0 x 15 Onyx was post dilated to 3.25 mm in diameter.   Recommendations:   Continue aspirin and Plavix Likely discharge in a.m.  Diagnostic Dominance: Right Intervention       EKG:  EKG is  ordered today.  The ekg ordered today demonstrates normal sinus rhythm 69 bpm no ST or T wave deviation-no acute changes.  Recent Labs: 03/17/2021: BUN 24; Creatinine, Ser 1.70; Hemoglobin 13.8; Platelets 126; Potassium 4.7; Sodium 137  Recent Lipid Panel    Component Value Date/Time   CHOL 111 08/09/2017 0748   TRIG 183 (H) 08/09/2017 0748   HDL 36 (L) 08/09/2017 0748   CHOLHDL 3.1 08/09/2017 0748   CHOLHDL 4.1 06/09/2016 0930   VLDL 35 06/09/2016 0930   LDLCALC 38 08/09/2017 0748    ASSESSMENT & PLAN    Coronary artery disease-no recent episodes of arm neck  back or chest discomfort.  Underwent cardiac catheterization 06/12/2016.  He received PCI with DES to his mid LAD. Continue aspirin, atorvastatin, carvedilol Heart healthy low-sodium diet-salty 6 given Increase physical activity as tolerated  Hyperlipidemia-LDL 52 on 03/10/20 Continue aspirin, atorvastatin Heart healthy low-sodium high-fiber diet Increase physical activity as tolerated Follows with PCP-will have fasting lipids and LFTs drawn in April  Hypertension-BP today 122/86.  Well-controlled at home. Continue carvedilol Heart healthy low-sodium diet-salty 6 given Increase physical activity as tolerated  Preoperative cardiac evaluation-robotic assisted simple prostatectomy, Dr. Alexis Frock, 04/07/21   Chart reviewed as part of pre-operative protocol coverage. Given past medical history and time since last visit, based on ACC/AHA guidelines, Zylon E Moreaux would be at acceptable risk for the  planned procedure without further cardiovascular testing.   His RCRI is a class III risk, 6.6% risk of major cardiac event.  He is able to complete greater than 4 METS of physical activity.  Patient was advised that if he develops new symptoms prior to surgery to contact our office to arrange a follow-up appointment.  He verbalized understanding.     Disposition: Follow-up in 12 months.      Medication Adjustments/Labs and Tests Ordered: Current medicines are reviewed at length with the patient today.  Concerns regarding medicines are outlined above.  No orders of the defined types were placed in this encounter.  No orders of the defined types were placed in this encounter.   There are no Patient Instructions on file for this visit.   Signed, Deberah Pelton, NP  03/23/2021 11:35 AM      Notice: This dictation was prepared with Dragon dictation along with smaller phrase technology. Any transcriptional errors that result from this process are unintentional and may not be corrected  upon review.  I spent 14 minutes examining this patient, reviewing medications, and using patient centered shared decision making involving her cardiac care.  Prior to her visit I spent greater than 20 minutes reviewing her past medical history,  medications, and prior cardiac tests.

## 2021-03-29 ENCOUNTER — Ambulatory Visit (HOSPITAL_BASED_OUTPATIENT_CLINIC_OR_DEPARTMENT_OTHER): Payer: Medicare HMO | Admitting: General Practice

## 2021-03-29 ENCOUNTER — Other Ambulatory Visit: Payer: Self-pay

## 2021-03-29 ENCOUNTER — Encounter (HOSPITAL_BASED_OUTPATIENT_CLINIC_OR_DEPARTMENT_OTHER): Payer: Self-pay | Admitting: General Practice

## 2021-03-29 VITALS — BP 122/86 | HR 69 | Ht 75.0 in | Wt 220.0 lb

## 2021-03-29 DIAGNOSIS — E785 Hyperlipidemia, unspecified: Secondary | ICD-10-CM | POA: Diagnosis not present

## 2021-03-29 DIAGNOSIS — I25119 Atherosclerotic heart disease of native coronary artery with unspecified angina pectoris: Secondary | ICD-10-CM

## 2021-03-29 DIAGNOSIS — Z01810 Encounter for preprocedural cardiovascular examination: Secondary | ICD-10-CM | POA: Diagnosis not present

## 2021-03-29 DIAGNOSIS — I10 Essential (primary) hypertension: Secondary | ICD-10-CM | POA: Diagnosis not present

## 2021-03-29 NOTE — Patient Instructions (Signed)
Medication Instructions:  ?Your physician recommends that you continue on your current medications as directed. Please refer to the Current Medication list given to you today.  ?*If you need a refill on your cardiac medications before your next appointment, please call your pharmacy* ? ? ?Lab Work: ?Fasting lipids and lft's with your primary care provider when you see them in April ?If you have labs (blood work) drawn today and your tests are completely normal, you will receive your results only by: ?MyChart Message (if you have MyChart) OR ?A paper copy in the mail ?If you have any lab test that is abnormal or we need to change your treatment, we will call you to review the results. ? ?Follow-Up: ?At [facility], you and your health needs are our priority.  As part of our continuing mission to provide you with exceptional heart care, we have created designated Provider Care Teams.  These Care Teams include your primary Cardiologist (physician) and Advanced Practice Providers (APPs -  Physician Assistants and Nurse Practitioners) who all work together to provide you with the care you need, when you need it. ? ?We recommend signing up for the patient portal called "MyChart".  Sign up information is provided on this After Visit Summary.  MyChart is used to connect with patients for Virtual Visits (Telemedicine).  Patients are able to view lab/test results, encounter notes, upcoming appointments, etc.  Non-urgent messages can be sent to your provider as well.   ?To learn more about what you can do with MyChart, go to [email or web address].   ? ?Your next appointment:   ?1 year(s) ? ?The format for your next appointment:   ?In Person ? ?Provider:   ?Lesleigh Noe, MD { ? ?Other Instructions ?Try to maintain your physical activity  ?

## 2021-04-01 DIAGNOSIS — M5416 Radiculopathy, lumbar region: Secondary | ICD-10-CM | POA: Diagnosis not present

## 2021-04-01 DIAGNOSIS — M5459 Other low back pain: Secondary | ICD-10-CM | POA: Diagnosis not present

## 2021-04-01 DIAGNOSIS — G894 Chronic pain syndrome: Secondary | ICD-10-CM | POA: Diagnosis not present

## 2021-04-03 NOTE — Telephone Encounter (Signed)
Patient cleared during office visit with Edd Fabian, NP on 03/29/21. Clearance has been faxed to [phone]. ?

## 2021-04-03 NOTE — Telephone Encounter (Signed)
Follow Up: ? ? ? ?Tyler Fletcher is calling to check on the status of patient's clearance, Please fax asap please to [phone]. ?

## 2021-04-05 ENCOUNTER — Other Ambulatory Visit: Payer: Self-pay

## 2021-04-05 ENCOUNTER — Encounter (HOSPITAL_COMMUNITY)
Admission: RE | Admit: 2021-04-05 | Discharge: 2021-04-05 | Disposition: A | Discharge status: Home / Self Care | Payer: Medicare HMO | Source: Ambulatory Visit | Attending: Urology | Admitting: Urology

## 2021-04-05 DIAGNOSIS — N401 Enlarged prostate with lower urinary tract symptoms: Secondary | ICD-10-CM | POA: Diagnosis present

## 2021-04-05 DIAGNOSIS — I2510 Atherosclerotic heart disease of native coronary artery without angina pectoris: Secondary | ICD-10-CM | POA: Diagnosis present

## 2021-04-05 DIAGNOSIS — E785 Hyperlipidemia, unspecified: Secondary | ICD-10-CM | POA: Diagnosis present

## 2021-04-05 DIAGNOSIS — Z955 Presence of coronary angioplasty implant and graft: Secondary | ICD-10-CM | POA: Diagnosis not present

## 2021-04-05 DIAGNOSIS — I252 Old myocardial infarction: Secondary | ICD-10-CM | POA: Diagnosis not present

## 2021-04-05 DIAGNOSIS — Z20822 Contact with and (suspected) exposure to covid-19: Secondary | ICD-10-CM | POA: Diagnosis present

## 2021-04-05 DIAGNOSIS — N323 Diverticulum of bladder: Secondary | ICD-10-CM | POA: Diagnosis present

## 2021-04-05 DIAGNOSIS — Z8249 Family history of ischemic heart disease and other diseases of the circulatory system: Secondary | ICD-10-CM | POA: Diagnosis not present

## 2021-04-05 DIAGNOSIS — I25119 Atherosclerotic heart disease of native coronary artery with unspecified angina pectoris: Secondary | ICD-10-CM | POA: Diagnosis not present

## 2021-04-05 DIAGNOSIS — Z01812 Encounter for preprocedural laboratory examination: Secondary | ICD-10-CM | POA: Insufficient documentation

## 2021-04-05 DIAGNOSIS — R338 Other retention of urine: Secondary | ICD-10-CM | POA: Diagnosis present

## 2021-04-05 DIAGNOSIS — Z01818 Encounter for other preprocedural examination: Secondary | ICD-10-CM

## 2021-04-05 DIAGNOSIS — N139 Obstructive and reflux uropathy, unspecified: Secondary | ICD-10-CM | POA: Diagnosis present

## 2021-04-05 LAB — SARS CORONAVIRUS 2 (TAT 6-24 HRS): SARS Coronavirus 2: NEGATIVE

## 2021-04-06 ENCOUNTER — Encounter (HOSPITAL_COMMUNITY): Payer: Self-pay | Admitting: Urology

## 2021-04-06 NOTE — Anesthesia Preprocedure Evaluation (Addendum)
Anesthesia Evaluation  ?Patient identified by MRN, date of birth, ID band ?Patient awake ? ? ? ?Reviewed: ?Allergy & Precautions, NPO status , Patient's Chart, lab work & pertinent test results, reviewed documented beta blocker date and time  ? ?Airway ?Mallampati: I ? ?TM Distance: >3 FB ?Neck ROM: Full ? ? ? Dental ?no notable dental hx. ?(+) Teeth Intact, Dental Advisory Given ?  ?Pulmonary ?neg pulmonary ROS,  ?  ?Pulmonary exam normal ?breath sounds clear to auscultation ? ? ? ? ? ? Cardiovascular ?+ angina + CAD, + Past MI and + Cardiac Stents  ?Normal cardiovascular exam ?Rhythm:Regular Rate:Normal ? ?Cath 2018 ?Successful stent mid LAD reducing a 90% stenosis to 0% with TIMI grade 3 flow. 3.0 x 15 Onyx was post dilated to 3.25 mm in diameter. ? ? ?  ?Neuro/Psych ?negative neurological ROS ? negative psych ROS  ? GI/Hepatic ?negative GI ROS, Neg liver ROS,   ?Endo/Other  ?negative endocrine ROS ? Renal/GU ?negative Renal ROS  ?negative genitourinary ?  ?Musculoskeletal ?negative musculoskeletal ROS ?(+)  ? Abdominal ?  ?Peds ? Hematology ?negative hematology ROS ?(+)   ?Anesthesia Other Findings ?On oxycodone 10-20mg  QD ? Reproductive/Obstetrics ? ?  ? ? ? ? ? ? ? ? ? ? ? ? ? ?  ?  ? ? ? ? ? ? ? ?Anesthesia Physical ?Anesthesia Plan ? ?ASA: 3 ? ?Anesthesia Plan: General  ? ?Post-op Pain Management: Tylenol PO (pre-op)* and Ketamine IV*  ? ?Induction: Intravenous ? ?PONV Risk Score and Plan: 2 and Midazolam, Dexamethasone and Ondansetron ? ?Airway Management Planned: Oral ETT ? ?Additional Equipment:  ? ?Intra-op Plan:  ? ?Post-operative Plan: Extubation in OR ? ?Informed Consent: I have reviewed the patients History and Physical, chart, labs and discussed the procedure including the risks, benefits and alternatives for the proposed anesthesia with the patient or authorized representative who has indicated his/her understanding and acceptance.  ? ? ? ?Dental advisory  given ? ?Plan Discussed with: CRNA ? ?Anesthesia Plan Comments: (2 IVs)  ? ? ? ? ? ?Anesthesia Quick Evaluation ? ?

## 2021-04-07 ENCOUNTER — Other Ambulatory Visit (HOSPITAL_COMMUNITY): Payer: Self-pay

## 2021-04-07 ENCOUNTER — Inpatient Hospital Stay (HOSPITAL_COMMUNITY)
Admission: RE | Admit: 2021-04-07 | Discharge: 2021-04-08 | DRG: 708 | Disposition: A | Discharge status: Home / Self Care | Payer: Medicare HMO | Source: Ambulatory Visit | Attending: Urology | Admitting: Urology

## 2021-04-07 ENCOUNTER — Other Ambulatory Visit: Payer: Self-pay

## 2021-04-07 ENCOUNTER — Inpatient Hospital Stay (HOSPITAL_COMMUNITY): Payer: Medicare HMO | Admitting: Physician Assistant

## 2021-04-07 ENCOUNTER — Encounter (HOSPITAL_COMMUNITY): Payer: Self-pay | Admitting: Urology

## 2021-04-07 ENCOUNTER — Encounter (HOSPITAL_COMMUNITY)
Admission: RE | Disposition: A | Discharge status: Home / Self Care | Payer: Self-pay | Source: Ambulatory Visit | Attending: Urology

## 2021-04-07 ENCOUNTER — Inpatient Hospital Stay (HOSPITAL_BASED_OUTPATIENT_CLINIC_OR_DEPARTMENT_OTHER): Payer: Medicare HMO | Admitting: Anesthesiology

## 2021-04-07 DIAGNOSIS — I25119 Atherosclerotic heart disease of native coronary artery with unspecified angina pectoris: Secondary | ICD-10-CM | POA: Diagnosis not present

## 2021-04-07 DIAGNOSIS — N401 Enlarged prostate with lower urinary tract symptoms: Secondary | ICD-10-CM

## 2021-04-07 DIAGNOSIS — Z955 Presence of coronary angioplasty implant and graft: Secondary | ICD-10-CM

## 2021-04-07 DIAGNOSIS — E785 Hyperlipidemia, unspecified: Secondary | ICD-10-CM | POA: Diagnosis present

## 2021-04-07 DIAGNOSIS — N323 Diverticulum of bladder: Secondary | ICD-10-CM | POA: Diagnosis present

## 2021-04-07 DIAGNOSIS — Z20822 Contact with and (suspected) exposure to covid-19: Secondary | ICD-10-CM | POA: Diagnosis present

## 2021-04-07 DIAGNOSIS — R338 Other retention of urine: Secondary | ICD-10-CM

## 2021-04-07 DIAGNOSIS — Z8249 Family history of ischemic heart disease and other diseases of the circulatory system: Secondary | ICD-10-CM

## 2021-04-07 DIAGNOSIS — N139 Obstructive and reflux uropathy, unspecified: Secondary | ICD-10-CM | POA: Diagnosis present

## 2021-04-07 DIAGNOSIS — Z01818 Encounter for other preprocedural examination: Secondary | ICD-10-CM

## 2021-04-07 DIAGNOSIS — N400 Enlarged prostate without lower urinary tract symptoms: Secondary | ICD-10-CM | POA: Diagnosis present

## 2021-04-07 DIAGNOSIS — I252 Old myocardial infarction: Secondary | ICD-10-CM

## 2021-04-07 DIAGNOSIS — I2510 Atherosclerotic heart disease of native coronary artery without angina pectoris: Secondary | ICD-10-CM | POA: Diagnosis present

## 2021-04-07 HISTORY — PX: XI ROBOTIC ASSISTED SIMPLE PROSTATECTOMY: SHX6713

## 2021-04-07 LAB — BASIC METABOLIC PANEL
Anion gap: 6 (ref 5–15)
BUN: 22 mg/dL (ref 8–23)
CO2: 28 mmol/L (ref 22–32)
Calcium: 8.9 mg/dL (ref 8.9–10.3)
Chloride: 103 mmol/L (ref 98–111)
Creatinine, Ser: 1.42 mg/dL — ABNORMAL HIGH (ref 0.61–1.24)
GFR, Estimated: 54 mL/min — ABNORMAL LOW (ref >60)
Glucose, Bld: 224 mg/dL — ABNORMAL HIGH (ref 70–99)
Potassium: 4.8 mmol/L (ref 3.5–5.1)
Sodium: 137 mmol/L (ref 135–145)

## 2021-04-07 LAB — ABO/RH: ABO/RH(D): B POS

## 2021-04-07 LAB — CBC
HCT: 35.6 % — ABNORMAL LOW (ref 39.0–52.0)
Hemoglobin: 12.6 g/dL — ABNORMAL LOW (ref 13.0–17.0)
MCH: 33.4 pg (ref 26.0–34.0)
MCHC: 35.4 g/dL (ref 30.0–36.0)
MCV: 94.4 fL (ref 80.0–100.0)
Platelets: 129 10*3/uL — ABNORMAL LOW (ref 150–400)
RBC: 3.77 MIL/uL — ABNORMAL LOW (ref 4.22–5.81)
RDW: 13.2 % (ref 11.5–15.5)
WBC: 8.1 10*3/uL (ref 4.0–10.5)
nRBC: 0 % (ref 0.0–0.2)

## 2021-04-07 LAB — TYPE AND SCREEN
ABO/RH(D): B POS
Antibody Screen: NEGATIVE

## 2021-04-07 SURGERY — PROSTATECTOMY, SIMPLE, ROBOT-ASSISTED
Anesthesia: General | Site: Abdomen

## 2021-04-07 MED ORDER — FINASTERIDE 5 MG PO TABS
5.0000 mg | ORAL_TABLET | Freq: Every day | ORAL | Status: DC
  Administered 2021-04-08: 5 mg via ORAL
  Filled 2021-04-07: qty 1

## 2021-04-07 MED ORDER — CARVEDILOL 12.5 MG PO TABS
12.5000 mg | ORAL_TABLET | Freq: Two times a day (BID) | ORAL | Status: DC
  Administered 2021-04-07 – 2021-04-08 (×2): 12.5 mg via ORAL
  Filled 2021-04-07 (×2): qty 1

## 2021-04-07 MED ORDER — MIDAZOLAM HCL 5 MG/5ML IJ SOLN
INTRAMUSCULAR | Status: DC | PRN
  Administered 2021-04-07: 2 mg via INTRAVENOUS

## 2021-04-07 MED ORDER — DEXTROSE-NACL 5-0.45 % IV SOLN: INTRAVENOUS | Status: AC

## 2021-04-07 MED ORDER — LIDOCAINE 2% (20 MG/ML) 5 ML SYRINGE
INTRAMUSCULAR | Status: DC | PRN
  Administered 2021-04-07: 80 mg via INTRAVENOUS

## 2021-04-07 MED ORDER — HYDROMORPHONE HCL 1 MG/ML IJ SOLN
INTRAMUSCULAR | Status: DC | PRN
  Administered 2021-04-07 (×2): 1 mg via INTRAVENOUS

## 2021-04-07 MED ORDER — FAMOTIDINE 20 MG PO TABS
20.0000 mg | ORAL_TABLET | Freq: Every day | ORAL | Status: DC
  Administered 2021-04-08: 20 mg via ORAL
  Filled 2021-04-07: qty 1

## 2021-04-07 MED ORDER — CHLORHEXIDINE GLUCONATE 0.12 % MT SOLN
15.0000 mL | Freq: Once | OROMUCOSAL | Status: AC
  Administered 2021-04-07: 15 mL via OROMUCOSAL

## 2021-04-07 MED ORDER — KETAMINE HCL 50 MG/5ML IJ SOSY
PREFILLED_SYRINGE | INTRAMUSCULAR | Status: AC
  Filled 2021-04-07: qty 5

## 2021-04-07 MED ORDER — KETOROLAC TROMETHAMINE 15 MG/ML IJ SOLN
15.0000 mg | Freq: Four times a day (QID) | INTRAMUSCULAR | Status: DC | PRN
  Administered 2021-04-08: 15 mg via INTRAVENOUS
  Filled 2021-04-07: qty 1

## 2021-04-07 MED ORDER — PROPOFOL 10 MG/ML IV BOLUS
INTRAVENOUS | Status: DC | PRN
  Administered 2021-04-07: 150 mg via INTRAVENOUS

## 2021-04-07 MED ORDER — ONDANSETRON HCL 4 MG/2ML IJ SOLN
INTRAMUSCULAR | Status: DC | PRN
  Administered 2021-04-07: 4 mg via INTRAVENOUS

## 2021-04-07 MED ORDER — BUPIVACAINE LIPOSOME 1.3 % IJ SUSP
INTRAMUSCULAR | Status: AC
  Filled 2021-04-07: qty 20

## 2021-04-07 MED ORDER — ESMOLOL HCL 100 MG/10ML IV SOLN
INTRAVENOUS | Status: DC | PRN
  Administered 2021-04-07 (×2): 20 mg via INTRAVENOUS

## 2021-04-07 MED ORDER — LACTATED RINGERS IV SOLN
INTRAVENOUS | Status: DC
  Administered 2021-04-07: 1000 mL via INTRAVENOUS

## 2021-04-07 MED ORDER — FENTANYL CITRATE (PF) 250 MCG/5ML IJ SOLN
INTRAMUSCULAR | Status: AC
  Filled 2021-04-07: qty 5

## 2021-04-07 MED ORDER — HYDROMORPHONE HCL 1 MG/ML IJ SOLN
1.0000 mg | INTRAMUSCULAR | Status: DC | PRN
  Administered 2021-04-07: 1 mg via INTRAVENOUS
  Filled 2021-04-07: qty 1

## 2021-04-07 MED ORDER — SUGAMMADEX SODIUM 500 MG/5ML IV SOLN
INTRAVENOUS | Status: DC | PRN
Start: 2021-04-07 — End: 2021-04-07
  Administered 2021-04-07: 300 mg via INTRAVENOUS

## 2021-04-07 MED ORDER — DEXAMETHASONE SODIUM PHOSPHATE 10 MG/ML IJ SOLN
INTRAMUSCULAR | Status: DC | PRN
  Administered 2021-04-07: 10 mg via INTRAVENOUS

## 2021-04-07 MED ORDER — SODIUM CHLORIDE (PF) 0.9 % IJ SOLN
INTRAMUSCULAR | Status: AC
  Filled 2021-04-07: qty 20

## 2021-04-07 MED ORDER — SENNOSIDES-DOCUSATE SODIUM 8.6-50 MG PO TABS
2.0000 | ORAL_TABLET | Freq: Every day | ORAL | Status: DC
  Administered 2021-04-07: 2 via ORAL
  Filled 2021-04-07: qty 2

## 2021-04-07 MED ORDER — SODIUM CHLORIDE (PF) 0.9 % IJ SOLN
INTRAMUSCULAR | Status: AC
  Filled 2021-04-07: qty 10

## 2021-04-07 MED ORDER — BUPIVACAINE LIPOSOME 1.3 % IJ SUSP
INTRAMUSCULAR | Status: DC | PRN
  Administered 2021-04-07: 20 mL

## 2021-04-07 MED ORDER — SULFAMETHOXAZOLE-TRIMETHOPRIM 800-160 MG PO TABS
1.0000 | ORAL_TABLET | Freq: Two times a day (BID) | ORAL | 0 refills | Status: AC
  Filled 2021-04-07: qty 6, 3d supply, fill #0

## 2021-04-07 MED ORDER — SODIUM CHLORIDE 0.9 % IV SOLN
Freq: Once | INTRAVENOUS | Status: AC
  Administered 2021-04-07: 1000 mL via INTRAVENOUS

## 2021-04-07 MED ORDER — SUGAMMADEX SODIUM 500 MG/5ML IV SOLN
INTRAVENOUS | Status: AC
  Filled 2021-04-07: qty 5

## 2021-04-07 MED ORDER — ONDANSETRON HCL 4 MG/2ML IJ SOLN
4.0000 mg | INTRAMUSCULAR | Status: DC | PRN
  Administered 2021-04-08: 4 mg via INTRAVENOUS
  Filled 2021-04-07: qty 2

## 2021-04-07 MED ORDER — KETAMINE HCL 50 MG/ML IJ SOLN
INTRAMUSCULAR | Status: DC | PRN
  Administered 2021-04-07: 20 mg via INTRAMUSCULAR
  Administered 2021-04-07: 30 mg via INTRAMUSCULAR

## 2021-04-07 MED ORDER — HYDROMORPHONE HCL 2 MG PO TABS
2.0000 mg | ORAL_TABLET | Freq: Four times a day (QID) | ORAL | 0 refills | Status: AC | PRN
  Filled 2021-04-07: qty 20, 5d supply, fill #0

## 2021-04-07 MED ORDER — CYCLOBENZAPRINE HCL 10 MG PO TABS: 10.0000 mg | ORAL_TABLET | Freq: Three times a day (TID) | ORAL | Status: DC | PRN

## 2021-04-07 MED ORDER — LABETALOL HCL 5 MG/ML IV SOLN
INTRAVENOUS | Status: AC
  Filled 2021-04-07: qty 4

## 2021-04-07 MED ORDER — TAMSULOSIN HCL 0.4 MG PO CAPS
0.4000 mg | ORAL_CAPSULE | Freq: Every day | ORAL | Status: DC
  Administered 2021-04-08: 0.4 mg via ORAL
  Filled 2021-04-07: qty 1

## 2021-04-07 MED ORDER — INFLUENZA VAC A&B SA ADJ QUAD 0.5 ML IM PRSY
0.5000 mL | PREFILLED_SYRINGE | INTRAMUSCULAR | Status: DC | PRN
  Filled 2021-04-07: qty 0.5

## 2021-04-07 MED ORDER — ACETAMINOPHEN 500 MG PO TABS
1000.0000 mg | ORAL_TABLET | Freq: Once | ORAL | Status: AC
  Administered 2021-04-07: 1000 mg via ORAL
  Filled 2021-04-07: qty 2

## 2021-04-07 MED ORDER — HYDROMORPHONE HCL 2 MG/ML IJ SOLN
INTRAMUSCULAR | Status: AC
  Filled 2021-04-07: qty 1

## 2021-04-07 MED ORDER — LIDOCAINE HCL (PF) 2 % IJ SOLN
INTRAMUSCULAR | Status: AC
  Filled 2021-04-07: qty 10

## 2021-04-07 MED ORDER — FENTANYL CITRATE PF 50 MCG/ML IJ SOSY
PREFILLED_SYRINGE | INTRAMUSCULAR | Status: AC
  Filled 2021-04-07: qty 3

## 2021-04-07 MED ORDER — SODIUM CHLORIDE (PF) 0.9 % IJ SOLN
INTRAMUSCULAR | Status: DC | PRN
  Administered 2021-04-07: 20 mL via INTRAVENOUS

## 2021-04-07 MED ORDER — ORAL CARE MOUTH RINSE: 15.0000 mL | Freq: Once | OROMUCOSAL | Status: AC

## 2021-04-07 MED ORDER — ESMOLOL HCL 100 MG/10ML IV SOLN
INTRAVENOUS | Status: AC
  Filled 2021-04-07: qty 10

## 2021-04-07 MED ORDER — STERILE WATER FOR IRRIGATION IR SOLN
Status: DC | PRN
  Administered 2021-04-07: 1000 mL

## 2021-04-07 MED ORDER — FENTANYL CITRATE (PF) 100 MCG/2ML IJ SOLN
INTRAMUSCULAR | Status: DC | PRN
  Administered 2021-04-07: 50 ug via INTRAVENOUS
  Administered 2021-04-07 (×2): 100 ug via INTRAVENOUS

## 2021-04-07 MED ORDER — ATORVASTATIN CALCIUM 20 MG PO TABS
20.0000 mg | ORAL_TABLET | Freq: Every day | ORAL | Status: DC
  Administered 2021-04-08: 20 mg via ORAL
  Filled 2021-04-07: qty 1

## 2021-04-07 MED ORDER — HYDROMORPHONE HCL 2 MG PO TABS
2.0000 mg | ORAL_TABLET | ORAL | Status: DC | PRN
  Administered 2021-04-07 – 2021-04-08 (×3): 2 mg via ORAL
  Filled 2021-04-07 (×3): qty 1

## 2021-04-07 MED ORDER — CEFAZOLIN SODIUM-DEXTROSE 2-4 GM/100ML-% IV SOLN
2.0000 g | INTRAVENOUS | Status: AC
  Administered 2021-04-07: 2 g via INTRAVENOUS
  Filled 2021-04-07: qty 100

## 2021-04-07 MED ORDER — PHENYLEPHRINE 40 MCG/ML (10ML) SYRINGE FOR IV PUSH (FOR BLOOD PRESSURE SUPPORT)
PREFILLED_SYRINGE | INTRAVENOUS | Status: DC | PRN
  Administered 2021-04-07: 120 ug via INTRAVENOUS

## 2021-04-07 MED ORDER — LABETALOL HCL 5 MG/ML IV SOLN
10.0000 mg | Freq: Once | INTRAVENOUS | Status: AC
  Administered 2021-04-07: 10 mg via INTRAVENOUS

## 2021-04-07 MED ORDER — LACTATED RINGERS IV SOLN: INTRAVENOUS | Status: DC | PRN

## 2021-04-07 MED ORDER — LACTATED RINGERS IR SOLN
Status: DC | PRN
  Administered 2021-04-07: 1000 mL

## 2021-04-07 MED ORDER — MIDAZOLAM HCL 2 MG/2ML IJ SOLN
INTRAMUSCULAR | Status: AC
  Filled 2021-04-07: qty 2

## 2021-04-07 MED ORDER — ROCURONIUM BROMIDE 10 MG/ML (PF) SYRINGE
PREFILLED_SYRINGE | INTRAVENOUS | Status: DC | PRN
  Administered 2021-04-07: 20 mg via INTRAVENOUS
  Administered 2021-04-07: 30 mg via INTRAVENOUS
  Administered 2021-04-07: 10 mg via INTRAVENOUS
  Administered 2021-04-07: 100 mg via INTRAVENOUS

## 2021-04-07 MED ORDER — FENTANYL CITRATE PF 50 MCG/ML IJ SOSY
25.0000 ug | PREFILLED_SYRINGE | INTRAMUSCULAR | Status: DC | PRN
  Administered 2021-04-07 (×3): 50 ug via INTRAVENOUS

## 2021-04-07 MED ORDER — ACETAMINOPHEN 500 MG PO TABS
1000.0000 mg | ORAL_TABLET | Freq: Four times a day (QID) | ORAL | Status: AC
  Administered 2021-04-07 – 2021-04-08 (×4): 1000 mg via ORAL
  Filled 2021-04-07 (×4): qty 2

## 2021-04-07 SURGICAL SUPPLY — 71 items
ADH SKN CLS APL DERMABOND .7 (GAUZE/BANDAGES/DRESSINGS) ×1
APL PRP STRL LF DISP 70% ISPRP (MISCELLANEOUS) ×1
APL SWBSTK 6 STRL LF DISP (MISCELLANEOUS) ×1
APPLICATOR COTTON TIP 6 STRL (MISCELLANEOUS) ×1 IMPLANT
APPLICATOR COTTON TIP 6IN STRL (MISCELLANEOUS) ×2
BAG COUNTER SPONGE SURGICOUNT (BAG) IMPLANT
BAG SPNG CNTER NS LX DISP (BAG)
CATH FOLEY 2WAY SLVR 18FR 30CC (CATHETERS) ×2 IMPLANT
CATH FOLEY 3WAY 30CC 24FR (CATHETERS) ×2
CATH TIEMANN FOLEY 18FR 5CC (CATHETERS) IMPLANT
CATH URTH STD 24FR FL 3W 2 (CATHETERS) ×1 IMPLANT
CHLORAPREP W/TINT 26 (MISCELLANEOUS) ×2 IMPLANT
CLIP LIGATING HEM O LOK PURPLE (MISCELLANEOUS) IMPLANT
CLOTH BEACON ORANGE TIMEOUT ST (SAFETY) ×2 IMPLANT
COVER SURGICAL LIGHT HANDLE (MISCELLANEOUS) ×2 IMPLANT
COVER TIP SHEARS 8 DVNC (MISCELLANEOUS) ×1 IMPLANT
COVER TIP SHEARS 8MM DA VINCI (MISCELLANEOUS) ×2
CUTTER ECHEON FLEX ENDO 45 340 (ENDOMECHANICALS) ×1 IMPLANT
DERMABOND ADVANCED (GAUZE/BANDAGES/DRESSINGS) ×1
DERMABOND ADVANCED .7 DNX12 (GAUZE/BANDAGES/DRESSINGS) ×1 IMPLANT
DRAIN CHANNEL RND F F (WOUND CARE) IMPLANT
DRAPE ARM DVNC X/XI (DISPOSABLE) ×4 IMPLANT
DRAPE COLUMN DVNC XI (DISPOSABLE) ×1 IMPLANT
DRAPE DA VINCI XI ARM (DISPOSABLE) ×8
DRAPE DA VINCI XI COLUMN (DISPOSABLE) ×2
DRAPE SURG IRRIG POUCH 19X23 (DRAPES) ×2 IMPLANT
DRSG TEGADERM 4X4.75 (GAUZE/BANDAGES/DRESSINGS) ×4 IMPLANT
ELECT PENCIL ROCKER SW 15FT (MISCELLANEOUS) ×2 IMPLANT
ELECT REM PT RETURN 15FT ADLT (MISCELLANEOUS) ×2 IMPLANT
GLOVE SURG ENC MOIS LTX SZ6.5 (GLOVE) ×2 IMPLANT
GLOVE SURG ENC TEXT LTX SZ7.5 (GLOVE) ×4 IMPLANT
GLOVE SURG UNDER POLY LF SZ7.5 (GLOVE) ×2 IMPLANT
GOWN STRL REUS W/TWL LRG LVL3 (GOWN DISPOSABLE) ×6 IMPLANT
HOLDER FOLEY CATH W/STRAP (MISCELLANEOUS) ×2 IMPLANT
IRRIG SUCT STRYKERFLOW 2 WTIP (MISCELLANEOUS) ×2
IRRIGATION SUCT STRKRFLW 2 WTP (MISCELLANEOUS) ×1 IMPLANT
IV LACTATED RINGERS 1000ML (IV SOLUTION) ×2 IMPLANT
KIT TURNOVER KIT A (KITS) IMPLANT
NDL INSUFFLATION 14GA 120MM (NEEDLE) ×1 IMPLANT
NEEDLE INSUFFLATION 14GA 120MM (NEEDLE) ×2 IMPLANT
PACK ROBOT UROLOGY CUSTOM (CUSTOM PROCEDURE TRAY) ×2 IMPLANT
PAD POSITIONING PINK XL (MISCELLANEOUS) ×2 IMPLANT
PENCIL SMOKE EVACUATOR (MISCELLANEOUS) IMPLANT
PORT ACCESS TROCAR AIRSEAL 12 (TROCAR) ×1 IMPLANT
PORT ACCESS TROCAR AIRSEAL 5M (TROCAR) ×1
RELOAD STAPLE 45 4.1 GRN THCK (STAPLE) IMPLANT
SEAL CANN UNIV 5-8 DVNC XI (MISCELLANEOUS) ×4 IMPLANT
SEAL XI 5MM-8MM UNIVERSAL (MISCELLANEOUS) ×8
SET TRI-LUMEN FLTR TB AIRSEAL (TUBING) ×2 IMPLANT
SOLUTION ELECTROLUBE (MISCELLANEOUS) ×2 IMPLANT
SPIKE FLUID TRANSFER (MISCELLANEOUS) ×2 IMPLANT
SPONGE T-LAP 4X18 ~~LOC~~+RFID (SPONGE) ×2 IMPLANT
STAPLE RELOAD 45 GRN (STAPLE) ×1 IMPLANT
STAPLE RELOAD 45MM GREEN (STAPLE) ×2
SUT ETHILON 3 0 PS 1 (SUTURE) ×2 IMPLANT
SUT MNCRL AB 4-0 PS2 18 (SUTURE) ×4 IMPLANT
SUT PDS AB 1 CT1 27 (SUTURE) ×4 IMPLANT
SUT V-LOC BARB 180 2/0GR6 GS22 (SUTURE) ×4
SUT VIC AB 0 CT1 27 (SUTURE) ×6
SUT VIC AB 0 CT1 27XBRD ANTBC (SUTURE) ×3 IMPLANT
SUT VIC AB 2-0 SH 27 (SUTURE) ×2
SUT VIC AB 2-0 SH 27X BRD (SUTURE) ×1 IMPLANT
SUT VICRYL 0 UR6 27IN ABS (SUTURE) ×4 IMPLANT
SUT VLOC 180 2-0 9IN GS21 (SUTURE) ×2 IMPLANT
SUT VLOC BARB 180 ABS3/0GR12 (SUTURE)
SUTURE V-LC BRB 180 2/0GR6GS22 (SUTURE) ×2 IMPLANT
SUTURE VLOC BRB 180 ABS3/0GR12 (SUTURE) ×2 IMPLANT
TOWEL OR NON WOVEN STRL DISP B (DISPOSABLE) ×2 IMPLANT
TROCAR BLADELESS OPT 5 100 (ENDOMECHANICALS) IMPLANT
TROCAR XCEL NON-BLD 5MMX100MML (ENDOMECHANICALS) IMPLANT
WATER STERILE IRR 1000ML POUR (IV SOLUTION) ×2 IMPLANT

## 2021-04-07 NOTE — Anesthesia Postprocedure Evaluation (Signed)
Anesthesia Post Note ? ?Patient: Tyler Fletcher ? ?Procedure(s) Performed: XI ROBOTIC ASSISTED SIMPLE PROSTATECTOMY (Abdomen) ? ?  ? ?Patient location during evaluation: PACU ?Anesthesia Type: General ?Level of consciousness: awake and alert ?Pain management: pain level controlled ?Vital Signs Assessment: post-procedure vital signs reviewed and stable ?Respiratory status: spontaneous breathing, nonlabored ventilation, respiratory function stable and patient connected to nasal cannula oxygen ?Cardiovascular status: blood pressure returned to baseline and stable ?Postop Assessment: no apparent nausea or vomiting ?Anesthetic complications: no ? ? ?No notable events documented. ? ?Last Vitals:  ?Vitals:  ? 04/07/21 1600 04/07/21 1615  ?BP: (!) 176/93 (!) 179/97  ?Pulse: 74 77  ?Resp: 12 13  ?Temp:  36.4 ?C  ?SpO2: 100% 100%  ?  ?Last Pain:  ?Vitals:  ? 04/07/21 1615  ?TempSrc:   ?PainSc: Asleep  ? ? ?  ?  ?  ?  ?  ?  ? ?Jaxson Keener L Kaelan Emami ? ? ? ? ?

## 2021-04-07 NOTE — Op Note (Signed)
NAME: Tyler Fletcher, Tyler E. ?MEDICAL RECORD NO: [id number] ?ACCOUNT NO: [id number] ?DATE OF BIRTH: [birth year] ?FACILITY: [facility] ?LOCATION: [facility] ?PHYSICIAN: Sebastian Ache, MD ? ?Operative Report  ? ?DATE OF PROCEDURE: 04/07/2021 ? ?PREOPERATIVE DIAGNOSIS:  Very large prostate with refractory lower urinary tract symptoms. ? ?PROCEDURES:   Robotic-assisted laparoscopic simple prostatectomy. ? ?ESTIMATED BLOOD LOSS:  200 mL. ? ?COMPLICATIONS:  None. ? ?SPECIMEN:  Prostate adenoma for permanent pathology. ? ?FINDINGS:   ?1.  Trilobar prostatic hypertrophy as anticipated. ?2.  Evidence of prior umbilical mesh based hernia repair. ? ?ASSISTANTS: Reola Mosher, M.D. ? ?DRAINS:  ?1.  Jackson-Pratt drain to bulb suction. ?2.  Foley catheter to straight drain. ? ?INDICATIONS: The patient is a pleasant 66 year old man with a longstanding history of obstructive voiding symptoms.  He has been on medical therapy for quite some time.  He has a significant and increasing bothersome obstructive and irritative symptoms  ?as well as some compromised emptying and a known small bladder diverticulum.  Options were discussed for further management including continued medical therapy versus outlined procedure.  He wished to proceed with the latter given his very large gland  ?with median lobe anatomy. We felt that a simple prostatectomy would be most definitive.  He wished to proceed.  Informed consent was obtained and placed in medical record.  He does have history of recurrent umbilical hernia, not recurrent at present. He  ?has had operative repair x 2.  CT scan revealed reserved fat planes near his umbilicus. ? ?PROCEDURE IN DETAIL:  The patient being [name] being identified and the procedure being robotic simple prostatectomy was confirmed.  Procedure timeout was performed.  Intravenous antibiotics were administered.  General endotracheal anesthesia  ?induced.  The patient was placed into a low lithotomy position.  Sterile field was  created, prepping and draping the patient's penis, perineum, and proximal thigh using iodine and his infra-xiphoid abdomen using chlorhexidine gluconate.  A new Foley  ?catheter was placed free to straight drain.  He was further fastened to operating table using 3-inch tape over foam padding across the supraxiphoid chest.  His arms were tucked to the side with gel rolls.  A test of steep Trendelenburg positioning was  ?performed and was found be suitably positioned.  Next a high flow, low pressure pneumoperitoneum was obtained using Veress technique in the supraumbilical midline, having passed the aspiration and drop test.  An 8 mm robotic camera port was then placed  ?in the same location.  Laparoscopic examination of peritoneal cavity revealed no significant adhesions, no visceral injury.  Additional ports were then placed as follows:  Right paramedian 8 mm robotic port, right far lateral 12 mm AirSeal assist port,  ?right paramedian 5 mm suction port, left paramedian 8 mm robotic port, left far lateral 8 mm robotic port.  Robot was docked and passed the electronic checks.  Initial attention was directed at development of space of Retzius.  Incision was made lateral  ?to the right median umbilical ligament from the area in the midline towards the area of the internal ring coursing along the iliac vessels towards the area of the right ureter.  Right vas deferens was encountered and nontransected. The iliac vessels were ? visualized on the right bladder wall, swept away from the pelvic sidewall towards the area of the endopelvic fascia on the right side.  A mirror image dissection was performed on the left side.  Anterior attachment was taken down with cautery scissors.  ? The anterior base of the  prostate, bladder neck junction was defatted to better denote this transition and the endopelvic fascia was carefully swept away from the apical aspect of the prostate just enough to expose the dorsal venous complex.  It  was  ?controlled using green load stapler, the true bladder neck was identified.  An inverted U cystotomy was made approximately 1.5 cm proximal to this. Foley catheter was used to provide anterior traction of the prostate.  This provided excellent  ?visualization of the trilobar prostatic hypertrophy. Cystotomy was made for approximately 50% circumference of the bladder.  Ureteral orifices were easily visualized as well.  Next four stay sutures of 0 Vicryl were applied in the adenoma, one on the  ?right lobe, one in the left lobe and two in the median lobe respectively. The median lobe was retracted superior, anterior and posterior dissection was performed, keeping a good mucosal lip in place for later mucosal advancement. Ureteral orifices were  ?visualized and away from the plane of dissection.  The posterior adenoma plane was entered at the 6 o'clock position.  This was carried all the way to the prostatic apex.  This was then carefully carried laterally, first on the right side and then the  ?left side at 3 o'clock and 9 o'clock positions respectively.  Next, the right lobe of the prostate was dissected free of the adenoma towards the area of the 12 o'clock position, then the left lobe as well.  The entire abdomen was placed on superior  ?traction and final apical dissection was performed by incising the adenoma at 12 o'clock position, then tracing the catheter down to better visualize the apex, which was then released from the membranous urethra.  Once we completely freed up the large  ?adenoma specimen, it was placed in an EndoCatch bag for later retrieval.  Digital rectal exam was then performed using indicator glove.  No evidence of rectal violation was noted.  The prostatic fossa was fulgurated with point coagulation current and  ?resulted in excellent hemostasis. ? ?Next double-armed 3-0 V-Loc sutures were used to reapproximate the posterior bladder mucosa to the membranous urethra for its posterior 50%  circumference, thus creating a mucosal bridge from the bladder neck to the membranous urethra.  Ureteral orifices  ?remained visibly viable and patent.  The inverted U cystotomy was then closed using 2 separate running suture lines of 2-0 V-Loc meeting at 12 o'clock position. A 24-French 3-way Foley catheter was then placed, which irrigated quantitatively.  No  ?evidence of leak was noted with direct inspection.  A 20 mL sterile water in the balloon and was placed on gentle traction.  Irrigation port plugged.  Robot was then undocked.  The previous right lateral most assistant port site was closed with fascia  ?using Carter-Thomason suture passer and 0 Vicryl. A closed suction drain was brought out the previous left lateral most robotic port site into the peritoneal cavity.  Specimen was retrieved by extending the previous camera port site superiorly and  ?inferiorly for a total distance of approximately 3.5 cm, removing the adenoma specimen and set aside for permanent pathology.  Notably, this was just above this area of prior mesh based repair.  This was palpated through the extraction site.  The  ?extraction site was then closed with fascia using figure-of-eight Novafil x 4 followed by reapproximation of Scarpa's with running Vicryl.  All incision sites were infiltrated with dilute lipolyzed Marcaine and closed at the level of the skin using  ?subcuticular Monocryl followed by Dermabond.  Procedure was terminated.  The patient tolerated the procedure well, no immediate perioperative complications. The patient was taken to postanesthesia care in stable condition. ? ?PLAN FOR PATIENT: Admission.  ? ? ?[facility] ?D: 04/07/2021 2:45:40 pm T: 04/07/2021 11:27:00 pm  ?JOB: [id number]/ [id number]  ?

## 2021-04-07 NOTE — Transfer of Care (Signed)
Immediate Anesthesia Transfer of Care Note ? ?Patient: Tyler Fletcher ? ?Procedure(s) Performed: Procedure(s): ?XI ROBOTIC ASSISTED SIMPLE PROSTATECTOMY (N/A) ? ?Patient Location: PACU ? ?Anesthesia Type:General ? ?Level of Consciousness:  sedated, patient cooperative and responds to stimulation ? ?Airway & Oxygen Therapy:Patient Spontanous Breathing and Patient connected to face mask oxgen ? ?Post-op Assessment:  Report given to PACU RN and Post -op Vital signs reviewed and stable ? ?Post vital signs:  Reviewed and stable ? ?Last Vitals:  ?Vitals:  ? 04/07/21 0930 04/07/21 0942  ?BP: (!) 166/100 (!) 167/94  ?Pulse: 79   ?Temp: 36.7 ?C   ?SpO2: 98%   ? ? ?Complications: No apparent anesthesia complications ? ?

## 2021-04-07 NOTE — H&P (Signed)
Tyler Fletcher is an 66 y.o. male.   ? ?Chief Complaint: Pre-OP Simple Prostatectomy ? ?HPI:  ? ? ?1 - Lower URinary /Tract Symptoms From Very Large Prostate - on tamsulosin + finasteride at abaseline with increasing bother from mix o firritative and obstructive symptoms. Prostate vol , no strictures by cysto, prior biopsy neative for carcinoma.  ? ?PMH sig for CAD/Stent (follows [facility]), lumnbago (follows [facility]), left inguinal hernia repair, umbilcal hernia repair with mesh x2.  ? ?Today "[name]" is seen to proceed with simple prostatecotmy. No interval fevers. Most recent UA without infectious parameters. Cards clearance on file.  ? ? ?Past Medical History:  ?Diagnosis Date  ? Blood pressure elevated without history of HTN 06/09/2016  ? CAD (coronary artery disease), native coronary artery   ? 06/12/16 PCI w/ DES -->mLAD  ? Hyperlipidemia   ? ? ?Past Surgical History:  ?Procedure Laterality Date  ? CORONARY STENT INTERVENTION N/A 06/12/2016  ? Procedure: Coronary Stent Intervention;  Surgeon: Lyn Records, MD;  Location: [facility] INVASIVE CV LAB;  Service: Cardiovascular;  Laterality: N/A;  ? LEFT HEART CATH AND CORONARY ANGIOGRAPHY N/A 06/09/2016  ? Procedure: Left Heart Cath and Coronary Angiography;  Surgeon: Lyn Records, MD;  Location: [facility] INVASIVE CV LAB;  Service: Cardiovascular;  Laterality: N/A;  ? None    ? ? ?Family History  ?Problem Relation Age of Onset  ? Hypertension Mother   ? Heart attack Father   ? Heart disease Father   ? ?Social History:  reports that he has never smoked. He has never used smokeless tobacco. He reports that he does not drink alcohol and does not use drugs. ? ?Allergies: No Known Allergies ? ?No medications prior to admission.  ? ? ?Results for orders placed or performed during the hospital encounter of 04/05/21 (from the past 48 hour(s))  ?SARS CORONAVIRUS 2 (TAT 6-24 HRS) Nasopharyngeal Nasopharyngeal Swab     Status: None  ? Collection Time: 04/05/21 12:38 PM  ?  Specimen: Nasopharyngeal Swab  ?Result Value Ref Range  ? SARS Coronavirus 2 NEGATIVE NEGATIVE  ?  Comment: (NOTE) ?SARS-CoV-2 target nucleic acids are NOT DETECTED. ? ?The SARS-CoV-2 RNA is generally detectable in upper and lower ?respiratory specimens during the acute phase of infection. Negative ?results do not preclude SARS-CoV-2 infection, do not rule out ?co-infections with other pathogens, and should not be used as the ?sole basis for treatment or other patient management decisions. ?Negative results must be combined with clinical observations, ?patient history, and epidemiological information. The expected ?result is Negative. ? ?Fact Sheet for Patients: ?HairSlick.no ? ?Fact Sheet for Healthcare Providers: ?quierodirigir.com ? ?This test is not yet approved or cleared by the [place] FDA and  ?has been authorized for detection and/or diagnosis of SARS-CoV-2 by ?FDA under an Emergency Use Authorization (EUA). This EUA will remain  ?in effect (meaning this test can be used) for the duration of the ?COVID-19 declaration under Se ction 564(b)(1) of the Act, 21 U.S.C. ?section 360bbb-3(b)(1), unless the authorization is terminated or ?revoked sooner. ? ?Performed at [facility], [address] [place], [place] ?[address] ?  ? ?No results found. ? ?Review of Systems  ?Constitutional:  Negative for chills and fever.  ?Genitourinary:  Positive for difficulty urinating, frequency and urgency.  ?All other systems reviewed and are negative. ? ?There were no vitals taken for this visit. ?Physical Exam ?Vitals reviewed.  ?HENT:  ?   Head: Normocephalic.  ?  Mouth/Throat:  ?   Mouth: Mucous membranes are moist.  ?Eyes:  ?   Pupils: Pupils are equal, round, and reactive to light.  ?Cardiovascular:  ?   Rate and Rhythm: Normal rate.  ?Pulmonary:  ?   Effort: Pulmonary effort is normal.  ?Abdominal:  ?   General: Abdomen is flat.  ?Genitourinary: ?   Penis:  Normal.   ?   Comments: No CVAT at present ?Musculoskeletal:     ?   General: Normal range of motion.  ?   Cervical back: Normal range of motion.  ?Skin: ?   General: Skin is warm.  ?Neurological:  ?   General: No focal deficit present.  ?   Mental Status: He is alert.  ?Psychiatric:     ?   Mood and Affect: Mood normal.  ?  ? ?Assessment/Plan ? ?Proceed as planned with simple prostatectomy. Risks, benefits, alternatives, expectd peri-o pcourse disscussed previously and reiterated today.  ? ?Sebastian Ache, MD ?04/07/2021, 5:49 AM ? ? ? ?

## 2021-04-07 NOTE — Discharge Instructions (Signed)

## 2021-04-07 NOTE — Anesthesia Procedure Notes (Signed)
Procedure Name: Intubation ?Date/Time: 04/07/2021 12:00 PM ?Performed by: Lavina Hamman, CRNA ?Pre-anesthesia Checklist: Patient identified, Emergency Drugs available, Suction available, Patient being monitored and Timeout performed ?Patient Re-evaluated:Patient Re-evaluated prior to induction ?Oxygen Delivery Method: Circle system utilized ?Preoxygenation: Pre-oxygenation with 100% oxygen ?Induction Type: IV induction ?Ventilation: Mask ventilation without difficulty ?Laryngoscope Size: Mac and 4 ?Grade View: Grade I ?Tube type: Oral ?Tube size: 7.5 mm ?Number of attempts: 1 ?Airway Equipment and Method: Stylet ?Placement Confirmation: ETT inserted through vocal cords under direct vision, positive ETCO2, CO2 detector and breath sounds checked- equal and bilateral ?Secured at: 23 cm ?Tube secured with: Tape ?Dental Injury: Teeth and Oropharynx as per pre-operative assessment  ? ? ? ? ?

## 2021-04-07 NOTE — Brief Op Note (Signed)
04/07/2021 ? ?2:37 PM ? ?PATIENT:  Glennis Brink  66 y.o. male ? ?PRE-OPERATIVE DIAGNOSIS:  PROSTATIC HYPERTROPHY WITH RETENTION ? ?POST-OPERATIVE DIAGNOSIS:  PROSTATIC HYPERTROPHY WITH RETENTION ? ?PROCEDURE:  Procedure(s): ?XI ROBOTIC ASSISTED SIMPLE PROSTATECTOMY (N/A) ? ?SURGEON:  Surgeon(s) and Role: ?   Alexis Frock, MD - Primary ? ?PHYSICIAN ASSISTANT:  ? ?ASSISTANTS: Reola Mosher MD  ? ?ANESTHESIA:   local and general ? ?EBL:  200 mL  ? ?BLOOD ADMINISTERED:none ? ?DRAINS:  1 - JP to bulb; 2- Foley to gravity   ? ?LOCAL MEDICATIONS USED:  MARCAINE    ? ?SPECIMEN:  Source of Specimen:  prostate adenoma ? ?DISPOSITION OF SPECIMEN:  PATHOLOGY ? ?COUNTS:  YES ? ?TOURNIQUET:  * No tourniquets in log * ? ?DICTATION: .Other Dictation: Dictation Number [id number] ? ?PLAN OF CARE: Admit to inpatient  ? ?PATIENT DISPOSITION:  PACU - hemodynamically stable. ?  ?Delay start of Pharmacological VTE agent (>24hrs) due to surgical blood loss or risk of bleeding: yes ? ?

## 2021-04-08 ENCOUNTER — Encounter (HOSPITAL_COMMUNITY): Payer: Self-pay | Admitting: Urology

## 2021-04-08 ENCOUNTER — Other Ambulatory Visit (HOSPITAL_COMMUNITY): Payer: Self-pay

## 2021-04-08 DIAGNOSIS — Z8249 Family history of ischemic heart disease and other diseases of the circulatory system: Secondary | ICD-10-CM | POA: Diagnosis not present

## 2021-04-08 DIAGNOSIS — N401 Enlarged prostate with lower urinary tract symptoms: Secondary | ICD-10-CM | POA: Diagnosis present

## 2021-04-08 DIAGNOSIS — Z955 Presence of coronary angioplasty implant and graft: Secondary | ICD-10-CM | POA: Diagnosis not present

## 2021-04-08 DIAGNOSIS — E785 Hyperlipidemia, unspecified: Secondary | ICD-10-CM | POA: Diagnosis present

## 2021-04-08 DIAGNOSIS — N139 Obstructive and reflux uropathy, unspecified: Secondary | ICD-10-CM | POA: Diagnosis present

## 2021-04-08 DIAGNOSIS — I2510 Atherosclerotic heart disease of native coronary artery without angina pectoris: Secondary | ICD-10-CM | POA: Diagnosis present

## 2021-04-08 DIAGNOSIS — N323 Diverticulum of bladder: Secondary | ICD-10-CM | POA: Diagnosis present

## 2021-04-08 DIAGNOSIS — Z20822 Contact with and (suspected) exposure to covid-19: Secondary | ICD-10-CM | POA: Diagnosis present

## 2021-04-08 DIAGNOSIS — R338 Other retention of urine: Secondary | ICD-10-CM | POA: Diagnosis present

## 2021-04-08 LAB — CBC
HCT: 33.4 % — ABNORMAL LOW (ref 39.0–52.0)
Hemoglobin: 12 g/dL — ABNORMAL LOW (ref 13.0–17.0)
MCH: 33.5 pg (ref 26.0–34.0)
MCHC: 35.9 g/dL (ref 30.0–36.0)
MCV: 93.3 fL (ref 80.0–100.0)
Platelets: 129 10*3/uL — ABNORMAL LOW (ref 150–400)
RBC: 3.58 MIL/uL — ABNORMAL LOW (ref 4.22–5.81)
RDW: 13.1 % (ref 11.5–15.5)
WBC: 12.3 10*3/uL — ABNORMAL HIGH (ref 4.0–10.5)
nRBC: 0 % (ref 0.0–0.2)

## 2021-04-08 LAB — BASIC METABOLIC PANEL
Anion gap: 9 (ref 5–15)
BUN: 26 mg/dL — ABNORMAL HIGH (ref 8–23)
CO2: 25 mmol/L (ref 22–32)
Calcium: 8.6 mg/dL — ABNORMAL LOW (ref 8.9–10.3)
Chloride: 102 mmol/L (ref 98–111)
Creatinine, Ser: 1.61 mg/dL — ABNORMAL HIGH (ref 0.61–1.24)
GFR, Estimated: 47 mL/min — ABNORMAL LOW (ref >60)
Glucose, Bld: 239 mg/dL — ABNORMAL HIGH (ref 70–99)
Potassium: 4.4 mmol/L (ref 3.5–5.1)
Sodium: 136 mmol/L (ref 135–145)

## 2021-04-08 NOTE — Progress Notes (Signed)
Discharge information provided to and reviewed with patient.  PIV removed.  Patient educated on catheter care, catheter bag exchange, and wound care using teach back.  Patient verbalized understanding.  Patient escorted to main entrance via wheelchair with belongings and catheter supplies for transport home with wife. ? ?Bradd Burner, RN  ?

## 2021-04-08 NOTE — Progress Notes (Signed)
1 Day Post-Op ?Subjective: ?Pain controlled.  No nausea or emesis.  No flatus or bowel movement.  Tolerating clears.  Afebrile. ? ?Objective: ?Vital signs in last 24 hours: ?Temp:  [97.6 ?F (36.4 ?C)-98.9 ?F (37.2 ?C)] 98.3 ?F (36.8 ?C) (03/11 5053) ?Pulse Rate:  [71-96] 77 (03/11 0953) ?Resp:  [10-16] 10 (03/11 0953) ?BP: (161-200)/(89-107) 182/92 (03/11 0953) ?SpO2:  [97 %-100 %] 99 % (03/11 0953) ?Weight:  [99.1 kg] 99.1 kg (03/10 1647) ? ?Intake/Output from previous day: ?03/10 0701 - 03/11 0700 ?In: 3040 [P.O.:240; I.V.:2800] ?Out: 1825 [Urine:1495; Drains:130; Blood:200] ?Intake/Output this shift: ?Total I/O ?In: -  ?Out: 700 [Urine:700] ? ?UOP: 1.5L clear yellow ?JP: 130 mL SS ? ?Physical Exam:  ?General: Alert and oriented ?CV: RRR ?Lungs: Clear ?Abdomen: Soft, ND, ATTP; inc c/d/I ?Ext: NT, No erythema ? ?Lab Results: ?Recent Labs  ?  04/07/21 ?1459 04/08/21 ?0402  ?HGB 12.6* 12.0*  ?HCT 35.6* 33.4*  ? ?BMET ?Recent Labs  ?  04/07/21 ?1459 04/08/21 ?0402  ?NA 137 136  ?K 4.8 4.4  ?CL 103 102  ?CO2 28 25  ?GLUCOSE 224* 239*  ?BUN 22 26*  ?CREATININE 1.42* 1.61*  ?CALCIUM 8.9 8.6*  ? ? ? ?Studies/Results: ?No results found. ? ?Assessment/Plan: ?Very large BPH with LUTS: S/p robotic simple prostatectomy 04/07/2021 ? ?-Pain control as needed ?-Diet as tolerated ?-Reviewed labs, appropriate.  Creatinine was 1.61 today from 1.42 yesterday however preoperatively, it was 1.7.  I think this is at his baseline. ?-JP removed ?-Leave Foley catheter to gravity.  He will follow-up as scheduled for void trial. ?-Discharge home today ? ? LOS: 1 day  ? ?Matt R. Ellianna Ruest MD ?04/08/2021, 10:17 AM ?[facility]  ?Pager: [phone] ? ? ? ?

## 2021-04-08 NOTE — Discharge Summary (Signed)
Date of admission: 04/07/2021 ? ?Date of discharge: 04/08/2021 ? ?Admission diagnosis: BPH ? ?Discharge diagnosis: BPH ? ?Secondary diagnoses: Lower urinary tract symptoms ? ?History and Physical: For full details, please see admission history and physical. Briefly, Lomax E Atallah is a 66 y.o. year old patient with BPH who underwent robotic simple prostatectomy on 04/07/2021.  ? ?Hospital Course: The patient recovered in the usual expected fashion.  He had his diet advanced slowly.  Initially managed with IV pain control, then transitioned to PO meds when he was tolerating oral intake.  His labs were stable throughout the hospital course.  He was discharged to home on POD#1.  At the time of discharge the patient was tolerating a regular diet, ambulating, had adequate pain control and was agreeable to discharge.  Follow up as scheduled.   ? ?Laboratory values:  ?Recent Labs  ?  04/07/21 ?1459 04/08/21 ?0402  ?HGB 12.6* 12.0*  ?HCT 35.6* 33.4*  ? ?Recent Labs  ?  04/07/21 ?1459 04/08/21 ?0402  ?CREATININE 1.42* 1.61*  ? ? ?Disposition: Home ? ?Discharge instruction: The patient was instructed to be ambulatory but told to refrain from heavy lifting, strenuous activity, or driving.  ? ?Discharge medications:  ?Allergies as of 04/08/2021   ?No Known Allergies ?  ? ?  ?Medication List  ?  ? ?STOP taking these medications   ? ?Oxycodone HCl 10 MG Tabs ?  ? ?  ? ?TAKE these medications   ? ?aspirin 81 MG chewable tablet ?Chew 1 tablet (81 mg total) by mouth daily. ?  ?atorvastatin 20 MG tablet ?Commonly known as: LIPITOR ?TAKE 1 TABLET EVERY DAY (NEED MD APPOINTMENT) ?  ?carvedilol 12.5 MG tablet ?Commonly known as: COREG ?TAKE 1 TABLET TWICE DAILY WITH MEALS ?  ?cyclobenzaprine 10 MG tablet ?Commonly known as: FLEXERIL ?Take 10 mg by mouth 3 (three) times daily as needed for muscle spasms. ?  ?docusate sodium 100 MG capsule ?Commonly known as: COLACE ?Take 100 mg by mouth daily as needed for mild constipation. ?  ?famotidine  20 MG tablet ?Commonly known as: PEPCID ?Take 20 mg by mouth daily. ?  ?finasteride 5 MG tablet ?Commonly known as: PROSCAR ?Take 5 mg by mouth daily. ?  ?HYDROmorphone 2 MG tablet ?Commonly known as: Dilaudid ?Take 1 tablet by mouth every 6  hours as needed for up to 5 days for severe or moderate pain (post-opreatively). ?  ?MAGNESIUM PO ?Take 500 mg by mouth daily. ?  ?MULTIVITAMIN PO ?Take 1 tablet by mouth daily. ?  ?nitroGLYCERIN 0.4 MG SL tablet ?Commonly known as: NITROSTAT ?PLACE 1 TABLET (0.4 MG TOTAL) UNDER THE TONGUE EVERY 5 (FIVE) MINUTES AS NEEDED. ?  ?sulfamethoxazole-trimethoprim 800-160 MG tablet ?Commonly known as: BACTRIM DS ?Take 1 tablet by mouth 2  times daily for  3 days. Begin day prior to next Urology appointment. ?  ?tamsulosin 0.4 MG Caps capsule ?Commonly known as: FLOMAX ?Take 0.4 mg by mouth daily. ?  ? ?  ? ? ?Followup:  ? Follow-up Information   ? ? Sebastian Ache, MD Follow up on 04/24/2021.   ?Specialty: Urology ?Why: at 10:30 for MD visit and catheter removal. ?Contact information: ?[address] ?[place] [address] ?[phone] ? ? ?  ?  ? ?  ?  ? ?  ? ? ?Matt R. Shaunette Gassner MD ?[facility]  ?Pager: [phone]  ?

## 2021-04-08 NOTE — Plan of Care (Signed)
  Problem: Education: Goal: Knowledge of General Education information will improve Description: Including pain rating scale, medication(s)/side effects and non-pharmacologic comfort measures Outcome: Adequate for Discharge   Problem: Health Behavior/Discharge Planning: Goal: Ability to manage health-related needs will improve Outcome: Adequate for Discharge   Problem: Clinical Measurements: Goal: Ability to maintain clinical measurements within normal limits will improve Outcome: Adequate for Discharge Goal: Will remain free from infection Outcome: Adequate for Discharge Goal: Diagnostic test results will improve Outcome: Adequate for Discharge Goal: Respiratory complications will improve Outcome: Adequate for Discharge Goal: Cardiovascular complication will be avoided Outcome: Adequate for Discharge   Problem: Activity: Goal: Risk for activity intolerance will decrease Outcome: Adequate for Discharge   Problem: Nutrition: Goal: Adequate nutrition will be maintained Outcome: Adequate for Discharge   Problem: Coping: Goal: Level of anxiety will decrease Outcome: Adequate for Discharge   Problem: Elimination: Goal: Will not experience complications related to bowel motility Outcome: Adequate for Discharge Goal: Will not experience complications related to urinary retention Outcome: Adequate for Discharge   Problem: Pain Managment: Goal: General experience of comfort will improve Outcome: Adequate for Discharge   Problem: Safety: Goal: Ability to remain free from injury will improve Outcome: Adequate for Discharge   Problem: Skin Integrity: Goal: Risk for impaired skin integrity will decrease Outcome: Adequate for Discharge   Problem: Urinary Elimination: Goal: Signs and symptoms of infection will decrease Outcome: Adequate for Discharge   

## 2021-04-10 LAB — SURGICAL PATHOLOGY

## 2021-05-16 DIAGNOSIS — Z0000 Encounter for general adult medical examination without abnormal findings: Secondary | ICD-10-CM | POA: Diagnosis not present

## 2021-05-16 DIAGNOSIS — N400 Enlarged prostate without lower urinary tract symptoms: Secondary | ICD-10-CM | POA: Diagnosis not present

## 2021-05-16 DIAGNOSIS — E782 Mixed hyperlipidemia: Secondary | ICD-10-CM | POA: Diagnosis not present

## 2021-05-16 DIAGNOSIS — I2510 Atherosclerotic heart disease of native coronary artery without angina pectoris: Secondary | ICD-10-CM | POA: Diagnosis not present

## 2021-05-16 DIAGNOSIS — R7303 Prediabetes: Secondary | ICD-10-CM | POA: Diagnosis not present

## 2021-05-16 DIAGNOSIS — K219 Gastro-esophageal reflux disease without esophagitis: Secondary | ICD-10-CM | POA: Diagnosis not present

## 2021-05-16 DIAGNOSIS — Z1211 Encounter for screening for malignant neoplasm of colon: Secondary | ICD-10-CM | POA: Diagnosis not present

## 2021-05-16 DIAGNOSIS — J309 Allergic rhinitis, unspecified: Secondary | ICD-10-CM | POA: Diagnosis not present

## 2021-08-03 ENCOUNTER — Other Ambulatory Visit: Payer: Self-pay | Admitting: Interventional Cardiology

## 2021-08-14 DIAGNOSIS — M7918 Myalgia, other site: Secondary | ICD-10-CM | POA: Diagnosis not present

## 2021-08-14 DIAGNOSIS — Z79891 Long term (current) use of opiate analgesic: Secondary | ICD-10-CM | POA: Diagnosis not present

## 2021-08-14 DIAGNOSIS — Z5181 Encounter for therapeutic drug level monitoring: Secondary | ICD-10-CM | POA: Diagnosis not present

## 2021-12-18 DIAGNOSIS — M5416 Radiculopathy, lumbar region: Secondary | ICD-10-CM | POA: Diagnosis not present

## 2021-12-18 DIAGNOSIS — M1712 Unilateral primary osteoarthritis, left knee: Secondary | ICD-10-CM | POA: Diagnosis not present

## 2021-12-18 DIAGNOSIS — Z5181 Encounter for therapeutic drug level monitoring: Secondary | ICD-10-CM | POA: Diagnosis not present

## 2021-12-18 DIAGNOSIS — M791 Myalgia, unspecified site: Secondary | ICD-10-CM | POA: Diagnosis not present

## 2021-12-18 DIAGNOSIS — G894 Chronic pain syndrome: Secondary | ICD-10-CM | POA: Diagnosis not present

## 2021-12-18 DIAGNOSIS — M5459 Other low back pain: Secondary | ICD-10-CM | POA: Diagnosis not present

## 2022-02-01 DIAGNOSIS — R7309 Other abnormal glucose: Secondary | ICD-10-CM | POA: Diagnosis not present

## 2022-02-01 DIAGNOSIS — R634 Abnormal weight loss: Secondary | ICD-10-CM | POA: Diagnosis not present

## 2022-02-07 ENCOUNTER — Other Ambulatory Visit (HOSPITAL_BASED_OUTPATIENT_CLINIC_OR_DEPARTMENT_OTHER): Payer: Self-pay | Admitting: Family Medicine

## 2022-02-07 DIAGNOSIS — R748 Abnormal levels of other serum enzymes: Secondary | ICD-10-CM

## 2022-02-21 ENCOUNTER — Telehealth (HOSPITAL_BASED_OUTPATIENT_CLINIC_OR_DEPARTMENT_OTHER): Payer: Self-pay

## 2022-03-03 ENCOUNTER — Telehealth (HOSPITAL_BASED_OUTPATIENT_CLINIC_OR_DEPARTMENT_OTHER): Payer: Self-pay

## 2022-05-28 ENCOUNTER — Other Ambulatory Visit: Payer: Self-pay | Admitting: *Deleted

## 2022-05-28 MED ORDER — CARVEDILOL 12.5 MG PO TABS: 12.5000 mg | ORAL_TABLET | Freq: Two times a day (BID) | ORAL | 0 refills | Status: DC

## 2022-06-19 ENCOUNTER — Other Ambulatory Visit (HOSPITAL_BASED_OUTPATIENT_CLINIC_OR_DEPARTMENT_OTHER): Payer: Self-pay | Admitting: General Practice

## 2022-08-16 ENCOUNTER — Other Ambulatory Visit (HOSPITAL_BASED_OUTPATIENT_CLINIC_OR_DEPARTMENT_OTHER): Payer: Self-pay | Admitting: General Practice

## 2022-09-08 ENCOUNTER — Other Ambulatory Visit (HOSPITAL_BASED_OUTPATIENT_CLINIC_OR_DEPARTMENT_OTHER): Payer: Self-pay | Admitting: General Practice

## 2022-09-21 ENCOUNTER — Other Ambulatory Visit: Payer: Self-pay

## 2022-09-21 MED ORDER — CARVEDILOL 12.5 MG PO TABS: 12.5000 mg | ORAL_TABLET | Freq: Two times a day (BID) | ORAL | 0 refills | Status: DC

## 2022-10-03 ENCOUNTER — Other Ambulatory Visit (HOSPITAL_BASED_OUTPATIENT_CLINIC_OR_DEPARTMENT_OTHER): Payer: Self-pay | Admitting: General Practice

## 2022-12-20 IMAGING — CT CT ABD-PELV W/O CM
1 of 2 series · 14 of 32 positions shown, 19 images · non-contrast
Comparison: None.

CLINICAL DATA: Dull, aching left flank pain with increased urinary
frequency over the past 3 months.

EXAM:
CT ABDOMEN AND PELVIS WITHOUT CONTRAST
TECHNIQUE: Multidetector CT imaging of the abdomen and pelvis was performed
following the standard protocol without IV contrast.

[Series 2: abd/pelvis w/(date) · axial · 0.89mm/px · z∈[-482,-62]mm · 14 of 96 slices shown, 19 images]
[im 6/96  soft-tissue]
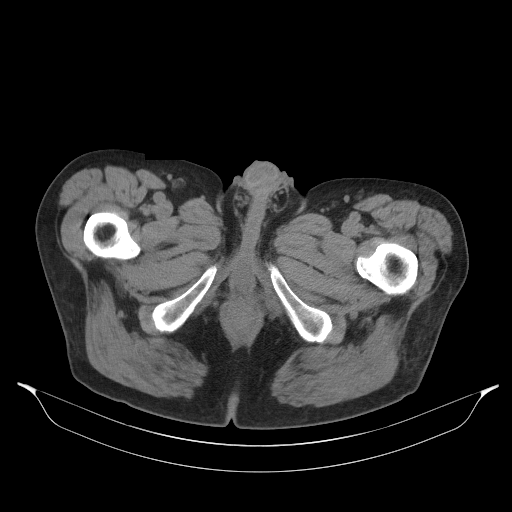
[im 6/96  bone]
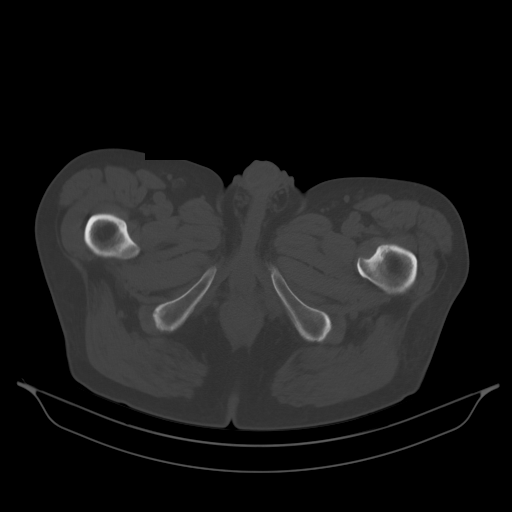
[im 11/96  soft-tissue]
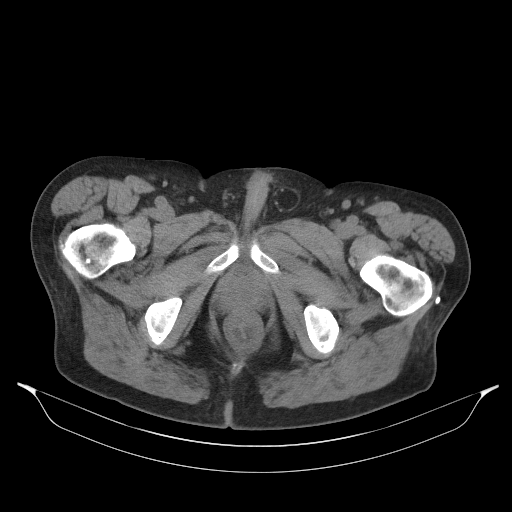
[im 22/96  soft-tissue]
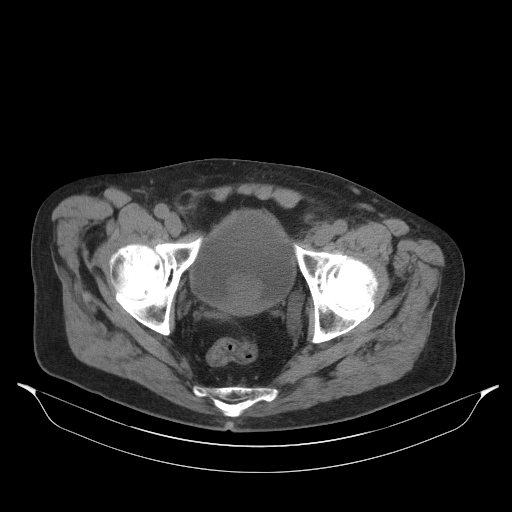
[im 27/96  soft-tissue]
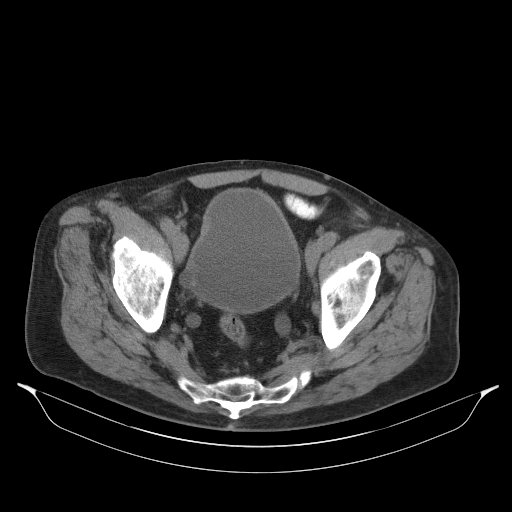
[im 32/96  soft-tissue]
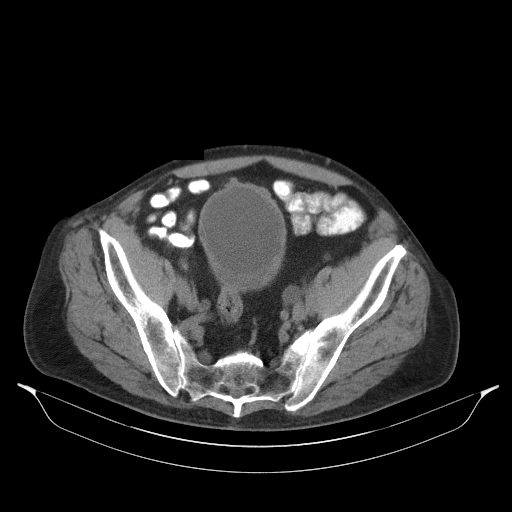
[im 43/96  soft-tissue]
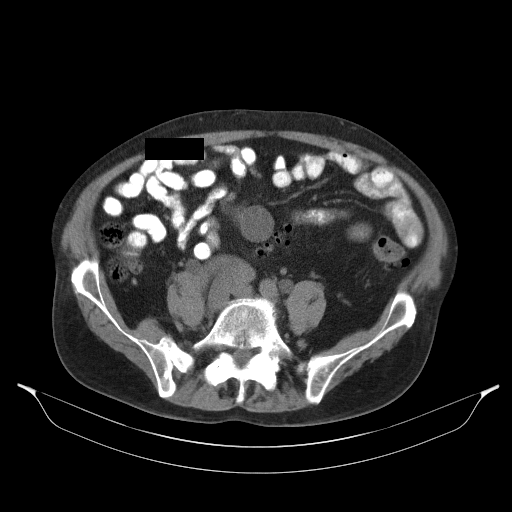
[im 48/96  soft-tissue]
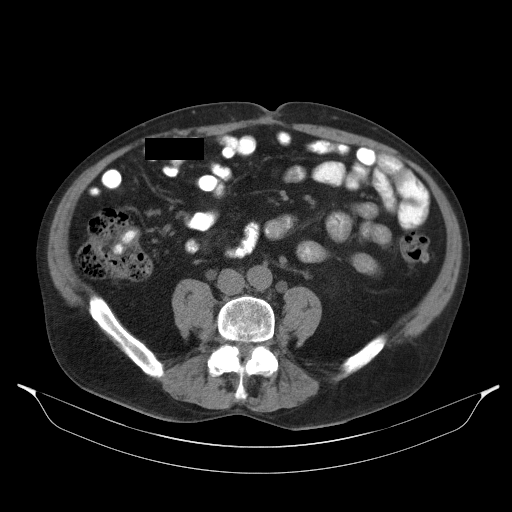
[im 53/96  soft-tissue]
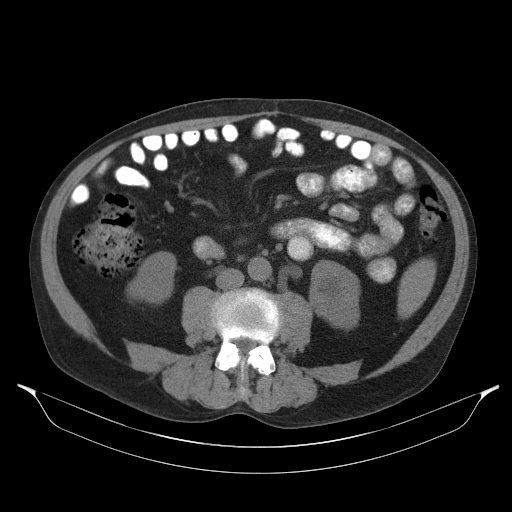
[im 64/96  soft-tissue]
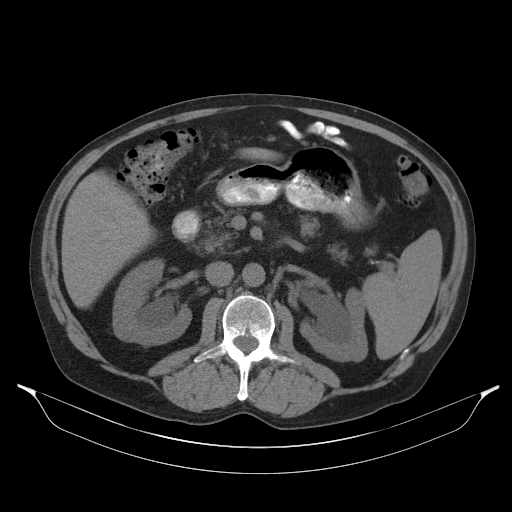
[im 64/96  bone]
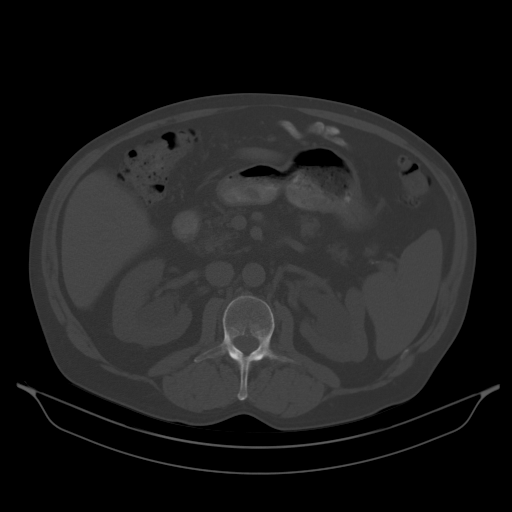
[im 69/96  soft-tissue]
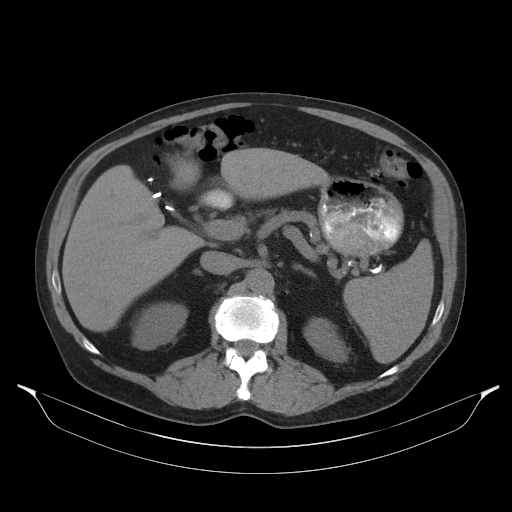
[im 74/96  soft-tissue]
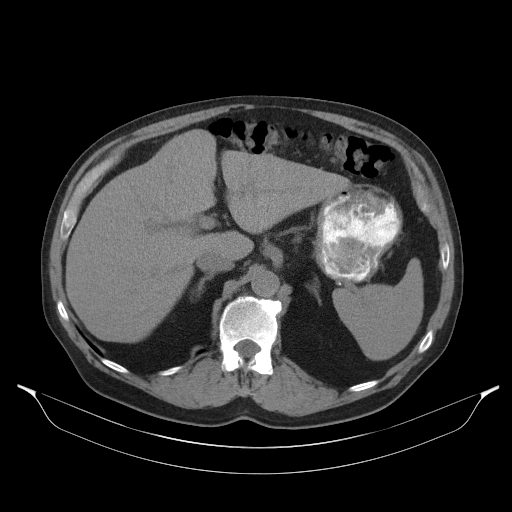
[im 74/96  lung]
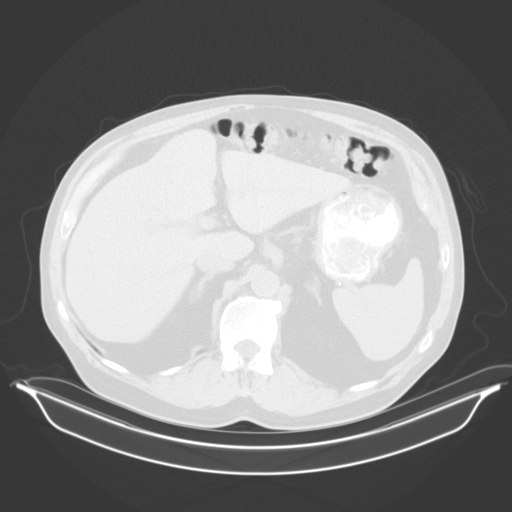
[im 80/96  lung]
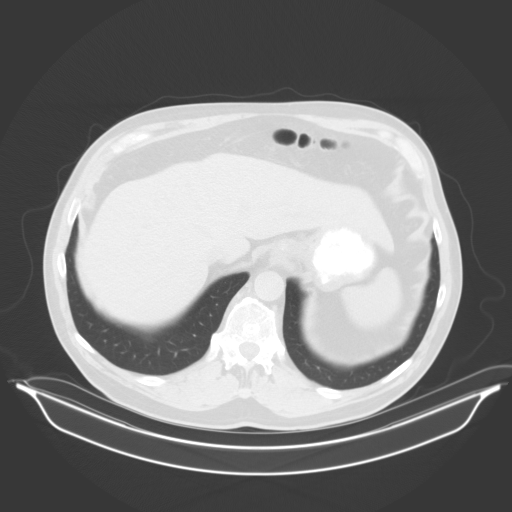
[im 85/96  soft-tissue]
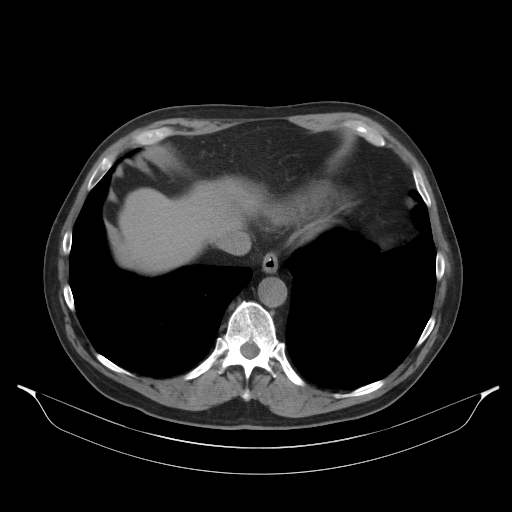
[im 85/96  lung]
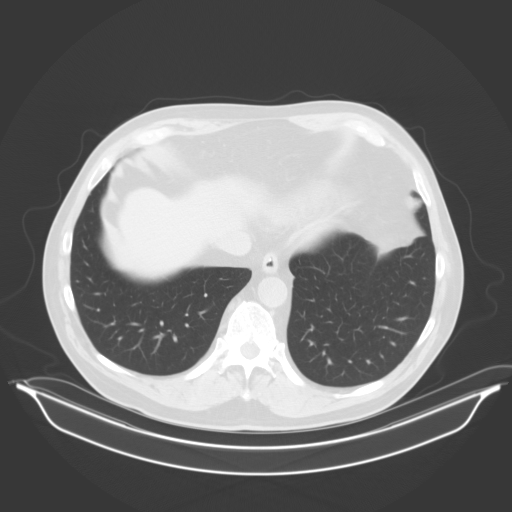
[im 90/96  soft-tissue]
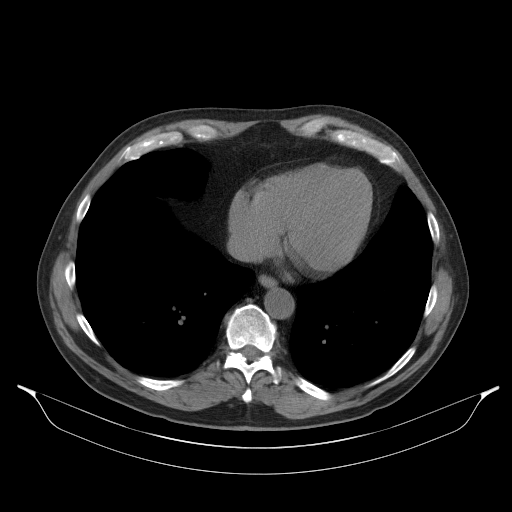
[im 90/96  lung]
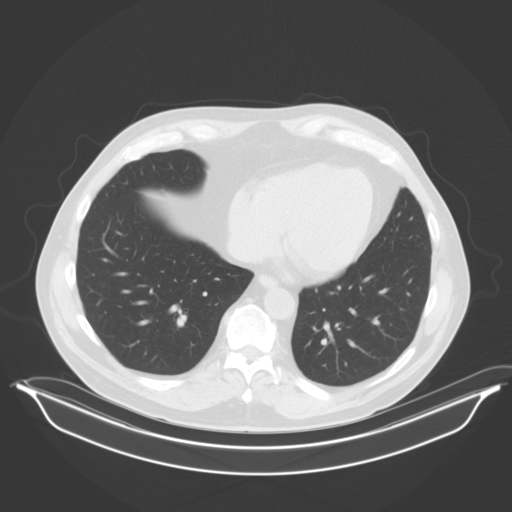

[14 of 32 positions shown; findings below may reference images not displayed]

FINDINGS: Lower chest: No acute abnormality.

Hepatobiliary: No focal liver abnormality is seen. Status post
cholecystectomy. No biliary dilatation.

Pancreas: Atrophic. No ductal dilatation or surrounding inflammatory
changes.

Spleen: Mildly enlarged.  No focal abnormality.

Adrenals/Urinary Tract: Adrenal glands are unremarkable. Moderate
left greater than right hydroureteronephrosis. Trabeculated bladder
with multiple diverticula, the largest measuring 4.9 cm.

Stomach/Bowel: Stomach is within normal limits. Appendix appears
normal. No evidence of bowel wall thickening, distention, or
inflammatory changes. Left-sided colonic diverticulosis.

Vascular/Lymphatic: No significant vascular findings are present. No
enlarged abdominal or pelvic lymph nodes.

Reproductive: Prostatomegaly with prominent median lobe hypertrophy
extending into the bladder base.

Other: Small fat containing left inguinal hernia. No free fluid or
pneumoperitoneum.

Musculoskeletal: No acute or significant osseous findings.
IMPRESSION: 1. Moderate left greater than right hydroureteronephrosis.
Trabeculated bladder with multiple diverticula. Findings consistent
with chronic outlet obstruction due to prostatomegaly.
2. Mild splenomegaly.
